# Patient Record
Sex: Female | Born: 1950
Health system: Southern US, Community
[De-identification: ages and names within clinical notes are randomized; demographics above are authoritative.]

## PROBLEM LIST (undated history)

## (undated) DIAGNOSIS — I7300 Raynaud's syndrome without gangrene: Secondary | ICD-10-CM

## (undated) DIAGNOSIS — M1990 Unspecified osteoarthritis, unspecified site: Secondary | ICD-10-CM

## (undated) DIAGNOSIS — E079 Disorder of thyroid, unspecified: Secondary | ICD-10-CM

## (undated) DIAGNOSIS — K219 Gastro-esophageal reflux disease without esophagitis: Secondary | ICD-10-CM

## (undated) DIAGNOSIS — E039 Hypothyroidism, unspecified: Secondary | ICD-10-CM

## (undated) DIAGNOSIS — F32A Depression, unspecified: Secondary | ICD-10-CM

## (undated) DIAGNOSIS — B009 Herpesviral infection, unspecified: Secondary | ICD-10-CM

## (undated) DIAGNOSIS — R011 Cardiac murmur, unspecified: Secondary | ICD-10-CM

## (undated) HISTORY — PX: WISDOM TOOTH EXTRACTION: SHX21

## (undated) HISTORY — PX: TUBAL LIGATION: SHX77

## (undated) HISTORY — PX: THYROIDECTOMY: SHX17

---

## 2017-09-03 ENCOUNTER — Other Ambulatory Visit: Payer: Self-pay | Admitting: Family Medicine

## 2017-09-03 ENCOUNTER — Ambulatory Visit
Admission: RE | Admit: 2017-09-03 | Discharge: 2017-09-03 | Disposition: A | Discharge status: Home / Self Care | Payer: BLUE CROSS/BLUE SHIELD | Source: Ambulatory Visit | Attending: Family Medicine | Admitting: Family Medicine

## 2017-09-03 DIAGNOSIS — M79641 Pain in right hand: Secondary | ICD-10-CM

## 2017-09-03 DIAGNOSIS — M79642 Pain in left hand: Principal | ICD-10-CM

## 2017-11-01 ENCOUNTER — Ambulatory Visit (HOSPITAL_COMMUNITY)
Admission: EM | Admit: 2017-11-01 | Discharge: 2017-11-01 | Disposition: A | Discharge status: Home / Self Care | Payer: BLUE CROSS/BLUE SHIELD | Attending: Family Medicine | Admitting: Family Medicine

## 2017-11-01 ENCOUNTER — Encounter (HOSPITAL_COMMUNITY): Payer: Self-pay | Admitting: Family Medicine

## 2017-11-01 DIAGNOSIS — N390 Urinary tract infection, site not specified: Secondary | ICD-10-CM

## 2017-11-01 DIAGNOSIS — E079 Disorder of thyroid, unspecified: Secondary | ICD-10-CM | POA: Diagnosis not present

## 2017-11-01 DIAGNOSIS — R8281 Pyuria: Secondary | ICD-10-CM

## 2017-11-01 DIAGNOSIS — R509 Fever, unspecified: Secondary | ICD-10-CM | POA: Diagnosis present

## 2017-11-01 HISTORY — DX: Disorder of thyroid, unspecified: E07.9

## 2017-11-01 LAB — CBC WITH DIFFERENTIAL/PLATELET
Basophils Absolute: 0 10*3/uL (ref 0.0–0.1)
Basophils Relative: 0 %
Eosinophils Absolute: 0.3 10*3/uL (ref 0.0–0.7)
Eosinophils Relative: 2 %
HCT: 36.5 % (ref 36.0–46.0)
Hemoglobin: 11.7 g/dL — ABNORMAL LOW (ref 12.0–15.0)
Lymphocytes Relative: 16 %
Lymphs Abs: 1.9 10*3/uL (ref 0.7–4.0)
MCH: 27.5 pg (ref 26.0–34.0)
MCHC: 32.1 g/dL (ref 30.0–36.0)
MCV: 85.7 fL (ref 78.0–100.0)
Monocytes Absolute: 1 10*3/uL (ref 0.1–1.0)
Monocytes Relative: 9 %
Neutro Abs: 8.8 10*3/uL — ABNORMAL HIGH (ref 1.7–7.7)
Neutrophils Relative %: 73 %
Platelets: 306 10*3/uL (ref 150–400)
RBC: 4.26 MIL/uL (ref 3.87–5.11)
RDW: 14.6 % (ref 11.5–15.5)
WBC: 12 10*3/uL — ABNORMAL HIGH (ref 4.0–10.5)

## 2017-11-01 LAB — POCT I-STAT, CHEM 8
BUN: 11 mg/dL (ref 6–20)
CHLORIDE: 103 mmol/L (ref 101–111)
CREATININE: 0.8 mg/dL (ref 0.44–1.00)
Calcium, Ion: 1.1 mmol/L — ABNORMAL LOW (ref 1.15–1.40)
Glucose, Bld: 80 mg/dL (ref 65–99)
HEMATOCRIT: 36 % (ref 36.0–46.0)
Hemoglobin: 12.2 g/dL (ref 12.0–15.0)
POTASSIUM: 4 mmol/L (ref 3.5–5.1)
Sodium: 138 mmol/L (ref 135–145)
TCO2: 26 mmol/L (ref 22–32)

## 2017-11-01 LAB — POCT URINALYSIS DIP (DEVICE)
BILIRUBIN URINE: NEGATIVE
Glucose, UA: NEGATIVE mg/dL
KETONES UR: NEGATIVE mg/dL
Nitrite: NEGATIVE
PH: 7 (ref 5.0–8.0)
Protein, ur: NEGATIVE mg/dL
SPECIFIC GRAVITY, URINE: 1.015 (ref 1.005–1.030)
Urobilinogen, UA: 0.2 mg/dL (ref 0.0–1.0)

## 2017-11-01 MED ORDER — SULFAMETHOXAZOLE-TRIMETHOPRIM 800-160 MG PO TABS: 1.0000 | ORAL_TABLET | Freq: Two times a day (BID) | ORAL | 0 refills | Status: AC

## 2017-11-01 NOTE — ED Triage Notes (Signed)
Pt here for fever and body aches x 4 days

## 2017-11-01 NOTE — Discharge Instructions (Addendum)
Several of your tests are still pending. Nevertheless, you do have some pyuria which could account for the nocturnal fevers.  At this point we are going to run a urine culture and await the final blood tests that we have sent off (CBC)   It may turn out that you had a viral infection, but at this point were starting the antibiotics for the pyuria

## 2017-11-01 NOTE — ED Provider Notes (Signed)
[place]   [id number] 11/01/17 Arrival Time: 1229   SUBJECTIVE:  Tonya Ingram is a 67 y.o. female who presents to the urgent care with complaint of fever and chills for 5 days.  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 On Tuesday of this week she developed an evening fever which she's had every night since. Her temperature is normal in the morning but by afternoon she starts getting chills and the temperature is back.  Patient had a sore throat and cough 2 weeks ago but that resolved and she's had none of that this week. She has no urinary tract infection symptoms, no cough, no nausea vomiting or diarrhea     Past Medical History:  Diagnosis Date  . Thyroid disease    History reviewed. No pertinent family history. Social History   Socioeconomic History  . Marital status: Married    Spouse name: Not on file  . Number of children: Not on file  . Years of education: Not on file  . Highest education level: Not on file  Occupational History  . Not on file  Social Needs  . Financial resource strain: Not on file  . Food insecurity:    Worry: Not on file    Inability: Not on file  . Transportation needs:    Medical: Not on file    Non-medical: Not on file  Tobacco Use  . Smoking status: Never Smoker  . Smokeless tobacco: Never Used  Substance and Sexual Activity  . Alcohol use: Not on file  . Drug use: Not on file  . Sexual activity: Not on file  Lifestyle  . Physical activity:    Days per week: Not on file    Minutes per session: Not on file  . Stress: Not on file  Relationships  . Social connections:    Talks on phone: Not on file    Gets together: Not on file    Attends religious service: Not on file    Active member of club or organization: Not on file    Attends meetings of clubs or organizations: Not on file    Relationship status: Not on file  . Intimate partner violence:    Fear of  current or ex partner: Not on file    Emotionally abused: Not on file    Physically abused: Not on file    Forced sexual activity: Not on file  Other Topics Concern  . Not on file  Social History Narrative  . Not on file   No outpatient medications have been marked as taking for the 11/01/17 encounter [facility] Encounter).   No Known Allergies    ROS: As per HPI, remainder of ROS negative.   OBJECTIVE:   Vitals:   11/01/17 1334  BP: (!) 138/58  Pulse: 70  Resp: 18  Temp: 99.6 F (37.6 C)  TempSrc: Oral  SpO2: 96%     General appearance: alert; no distress Eyes: PERRL; EOMI; conjunctiva normal HENT: normocephalic; atraumatic; TMs normal, canal normal, external ears normal without trauma; nasal mucosa normal; oral mucosa normal Neck: supple; no adenopathy; post surgical thyroid scar midline Lungs: clear to auscultation bilaterally Heart: regular rate and rhythm; soft, 2/6 systolic ejection murmur heard best at the right sternal border Abdomen: soft, non-tender; bowel sounds normal; no masses or organomegaly; no guarding or rebound tenderness Back: no CVA tenderness Extremities: no cyanosis or edema; symmetrical with no gross deformities Skin: warm and dry  Neurologic: normal gait; grossly normal Psychological: alert and cooperative; normal mood and affect      Labs:  Results for orders placed or performed during the hospital encounter of 11/01/17  I-STAT, chem 8  Result Value Ref Range   Sodium 138 135 - 145 mmol/L   Potassium 4.0 3.5 - 5.1 mmol/L   Chloride 103 101 - 111 mmol/L   BUN 11 6 - 20 mg/dL   Creatinine, Ser 0.80 0.44 - 1.00 mg/dL   Glucose, Bld 80 65 - 99 mg/dL   Calcium, Ion 1.10 (L) 1.15 - 1.40 mmol/L   TCO2 26 22 - 32 mmol/L   Hemoglobin 12.2 12.0 - 15.0 g/dL   HCT 36.0 36.0 - 46.0 %  POCT urinalysis dip (device)  Result Value Ref Range   Glucose, UA NEGATIVE NEGATIVE mg/dL   Bilirubin Urine NEGATIVE NEGATIVE   Ketones, ur NEGATIVE  NEGATIVE mg/dL   Specific Gravity, Urine 1.015 1.005 - 1.030   Hgb urine dipstick TRACE (A) NEGATIVE   pH 7.0 5.0 - 8.0   Protein, ur NEGATIVE NEGATIVE mg/dL   Urobilinogen, UA 0.2 0.0 - 1.0 mg/dL   Nitrite NEGATIVE NEGATIVE   Leukocytes, UA SMALL (A) NEGATIVE    Labs Reviewed  POCT I-STAT, CHEM 8 - Abnormal; Notable for the following components:      Result Value   Calcium, Ion 1.10 (*)    All other components within normal limits  POCT URINALYSIS DIP (DEVICE) - Abnormal; Notable for the following components:   Hgb urine dipstick TRACE (*)    Leukocytes, UA SMALL (*)    All other components within normal limits  URINE CULTURE  CBC WITH DIFFERENTIAL/PLATELET    No results found.     ASSESSMENT & PLAN:  1. Pyuria   Several of your tests are still pending. Nevertheless, you do have some pyuria which could account for the nocturnal fevers.  At this point we are going to run a urine culture and await the final blood tests that we have sent off (CBC)   It may turn out that you had a viral infection, but at this point were starting the antibiotics for the pyuria  Meds ordered this encounter  Medications  . sulfamethoxazole-trimethoprim (BACTRIM DS,SEPTRA DS) 800-160 MG tablet    Sig: Take 1 tablet by mouth 2 (two) times daily for 7 days.    Dispense:  14 tablet    Refill:  0    Reviewed expectations re: course of current medical issues. Questions answered. Outlined signs and symptoms indicating need for more acute intervention. Patient verbalized understanding. After Visit Summary given.    Procedures:      Robyn Haber, MD 11/01/17 1420

## 2017-11-03 ENCOUNTER — Telehealth (HOSPITAL_COMMUNITY): Payer: Self-pay

## 2017-11-03 LAB — URINE CULTURE: Culture: >=100000 — AB

## 2017-11-03 MED ORDER — CEPHALEXIN 500 MG PO CAPS: 500.0000 mg | ORAL_CAPSULE | Freq: Two times a day (BID) | ORAL | 0 refills | Status: AC

## 2017-11-03 NOTE — Telephone Encounter (Signed)
Rx changed to Keflex, per [facility] due to patients age.

## 2017-11-03 NOTE — Telephone Encounter (Signed)
Urine culture positive for E.Coli, this is resistant to Bactrim given at urgent care visit. Rx for Macrobid 100 mg BID x 5 days sent to pharmacy on record. Pt called and made aware, verbalized understanding.

## 2018-01-07 ENCOUNTER — Emergency Department (HOSPITAL_COMMUNITY): Payer: BLUE CROSS/BLUE SHIELD

## 2018-01-07 ENCOUNTER — Encounter (HOSPITAL_COMMUNITY): Payer: Self-pay | Admitting: Emergency Medicine

## 2018-01-07 ENCOUNTER — Emergency Department (HOSPITAL_COMMUNITY)
Admission: EM | Admit: 2018-01-07 | Discharge: 2018-01-07 | Disposition: A | Discharge status: Home / Self Care | Payer: BLUE CROSS/BLUE SHIELD | Attending: Emergency Medicine | Admitting: Emergency Medicine

## 2018-01-07 DIAGNOSIS — W010XXA Fall on same level from slipping, tripping and stumbling without subsequent striking against object, initial encounter: Secondary | ICD-10-CM | POA: Insufficient documentation

## 2018-01-07 DIAGNOSIS — Y999 Unspecified external cause status: Secondary | ICD-10-CM | POA: Insufficient documentation

## 2018-01-07 DIAGNOSIS — M25511 Pain in right shoulder: Secondary | ICD-10-CM

## 2018-01-07 DIAGNOSIS — W19XXXA Unspecified fall, initial encounter: Secondary | ICD-10-CM

## 2018-01-07 DIAGNOSIS — Y929 Unspecified place or not applicable: Secondary | ICD-10-CM | POA: Insufficient documentation

## 2018-01-07 DIAGNOSIS — Y9302 Activity, running: Secondary | ICD-10-CM | POA: Diagnosis not present

## 2018-01-07 NOTE — ED Triage Notes (Signed)
Patient here from home with complaints of right should pain after fall this morning while running. Unable to lift arm.

## 2018-01-07 NOTE — Discharge Instructions (Addendum)
As discussed, your evaluation today has been largely reassuring.  But, it is important that you monitor your condition carefully, and do not hesitate to return to the ED if you develop new, or concerning changes in your condition. ? ?Otherwise, please follow-up with your physician for appropriate ongoing care. ? ?

## 2018-01-07 NOTE — ED Provider Notes (Signed)
[facility] DEPT Provider Note   CSN: [id number] Arrival date & time: 01/07/18  3295     History   Chief Complaint Chief Complaint  Patient presents with  . Shoulder Injury    HPI Tonya Ingram is a 67 y.o. female.  HPI  Patient presents with shoulder pain after a fall. Patient was running, when she slipped, falling onto her right shoulder.  Pain is sore, worse with motion Since that time she has had pain in the shoulder, worse with continued running or trying to use a stationary bicycle. No other substantial trauma, no head contact, no syncope, no weakness or numbness in the hand, wrist. Patient took Aleve prior to the fall. She notes that she has had some ongoing soreness in the shoulder, nonradiating.  Past Medical History:  Diagnosis Date  . Thyroid disease     There are no active problems to display for this patient.   History reviewed. No pertinent surgical history.   OB History   None      Home Medications    Prior to Admission medications   Not on File    Family History No family history on file.  Social History Social History   Tobacco Use  . Smoking status: Never Smoker  . Smokeless tobacco: Never Used  Substance Use Topics  . Alcohol use: Not on file  . Drug use: Not on file     Allergies   Patient has no known allergies.   Review of Systems Review of Systems  Constitutional:       Per HPI, otherwise negative  HENT:       Per HPI, otherwise negative  Respiratory:       Per HPI, otherwise negative  Cardiovascular:       Per HPI, otherwise negative  Gastrointestinal: Negative for vomiting.  Endocrine:       Negative aside from HPI  Genitourinary:       Neg aside from HPI   Musculoskeletal:       Per HPI, otherwise negative  Skin: Negative.   Neurological: Negative for syncope.     Physical Exam Updated Vital Signs BP 136/83 (BP Location: Left Arm)   Pulse 86   Temp 98 F (36.7 C)  (Oral)   Resp 19   SpO2 99%   Physical Exam  Constitutional: She is oriented to person, place, and time. She appears well-developed and well-nourished. No distress.  HENT:  Head: Normocephalic and atraumatic.  Eyes: Conjunctivae and EOM are normal.  Cardiovascular: Normal rate and regular rhythm.  Pulmonary/Chest: Effort normal and breath sounds normal. No stridor. No respiratory distress.  Abdominal: She exhibits no distension.  Musculoskeletal: She exhibits no edema.       Right elbow: Normal.      Right wrist: Normal.       Arms: Neurological: She is alert and oriented to person, place, and time. No cranial nerve deficit.  Skin: Skin is warm and dry.  Psychiatric: She has a normal mood and affect.  Nursing note and vitals reviewed.    ED Treatments / Results  Labs (all labs ordered are listed, but only abnormal results are displayed) Labs Reviewed - No data to display  EKG None  Radiology No results found.  Procedures Procedures (including critical care time)  Medications Ordered in ED Medications - No data to display   Initial Impression / Assessment and Plan / ED Course  I have reviewed the triage vital signs and the  nursing notes.  Pertinent labs & imaging results that were available during my care of the patient were reviewed by me and considered in my medical decision making (see chart for details).  Patient presents with shoulder pain following a fall. Patient is awake, alert, with unrestricted range of motion, point tenderness about to the superior aspect of the shoulder, some suspicion for sprain given the reassuring x-rays, lack of distal neurovascular compromise, or deformity. Patient received ice here, sling, was discharged with ongoing anti-inflammatories, orthopedic follow-up as needed.  Final Clinical Impressions(s) / ED Diagnoses  Fall, initial encounter Shoulder pain, right, initial encounter   Carmin Muskrat, MD 01/07/18 [phone]

## 2019-04-26 ENCOUNTER — Other Ambulatory Visit: Payer: Self-pay

## 2019-04-26 DIAGNOSIS — Z20822 Contact with and (suspected) exposure to covid-19: Secondary | ICD-10-CM

## 2019-04-27 LAB — NOVEL CORONAVIRUS, NAA: SARS-CoV-2, NAA: DETECTED — AB

## 2019-08-23 ENCOUNTER — Ambulatory Visit: Payer: Self-pay | Attending: Internal Medicine

## 2019-08-23 DIAGNOSIS — Z23 Encounter for immunization: Secondary | ICD-10-CM | POA: Insufficient documentation

## 2019-08-23 NOTE — Progress Notes (Signed)
   Covid-19 Vaccination Clinic  Name:  Bonnielee Ryon    MRN: [id number] DOB: 11-Dec-1950  08/23/2019  Ms. Iyer was observed post Covid-19 immunization for 15 minutes without incidence. She was provided with Vaccine Information Sheet and instruction to access the V-Safe system.   Ms. Carlow was instructed to call 911 with any severe reactions post vaccine: Marland Kitchen Difficulty breathing  . Swelling of your face and throat  . A fast heartbeat  . A bad rash all over your body  . Dizziness and weakness    Immunizations Administered    Name Date Dose VIS Date Route   Pfizer COVID-19 Vaccine 08/23/2019 10:58 AM 0.3 mL 06/04/2019 Intramuscular   Manufacturer: [place]   Lot: [id number]   [place]: [id number]

## 2019-09-21 ENCOUNTER — Ambulatory Visit: Payer: Self-pay | Attending: Internal Medicine

## 2019-09-21 DIAGNOSIS — Z23 Encounter for immunization: Secondary | ICD-10-CM

## 2019-09-21 NOTE — Progress Notes (Signed)
   Covid-19 Vaccination Clinic  Name:  Tonya Ingram    MRN: [id number] DOB: 05/31/1951  09/21/2019  Ms. Tonya Ingram was observed post Covid-19 immunization for 15 minutes without incident. She was provided with Vaccine Information Sheet and instruction to access the V-Safe system.   Ms. Tonya Ingram was instructed to call 911 with any severe reactions post vaccine: Marland Kitchen Difficulty breathing  . Swelling of face and throat  . A fast heartbeat  . A bad rash all over body  . Dizziness and weakness   Immunizations Administered    Name Date Dose VIS Date Route   Pfizer COVID-19 Vaccine 09/21/2019 11:32 AM 0.3 mL 06/04/2019 Intramuscular   Manufacturer: [place]   Lot: [id number]   [place]: [id number]

## 2019-10-18 ENCOUNTER — Other Ambulatory Visit: Payer: Self-pay

## 2019-10-18 ENCOUNTER — Ambulatory Visit
Admission: EM | Admit: 2019-10-18 | Discharge: 2019-10-18 | Disposition: A | Discharge status: Home / Self Care | Payer: PPO

## 2019-10-18 ENCOUNTER — Encounter: Payer: Self-pay | Admitting: Emergency Medicine

## 2019-10-18 DIAGNOSIS — G8929 Other chronic pain: Secondary | ICD-10-CM

## 2019-10-18 DIAGNOSIS — M25562 Pain in left knee: Secondary | ICD-10-CM

## 2019-10-18 HISTORY — DX: Unspecified osteoarthritis, unspecified site: M19.90

## 2019-10-18 MED ORDER — PREDNISONE 50 MG PO TABS: 50.0000 mg | ORAL_TABLET | Freq: Every day | ORAL | 0 refills | Status: DC

## 2019-10-18 NOTE — ED Triage Notes (Signed)
Left knee for 2 months and last 2 weeks pain has increased significantly.  No known injury  Patient is a runner, bicycle rider, and has had this routine for years, nothing new.   Stem cell therapy in left hip, injection one month ago.  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

## 2019-10-18 NOTE — Discharge Instructions (Signed)
Start prednisone as directed. Ice compress, knee brace during activity. Follow up with orthopedics/sports medicine if symptoms not improving.

## 2019-10-18 NOTE — ED Provider Notes (Signed)
[facility]    CSN: [id number] Arrival date & time: 10/18/19  0808      History   Chief Complaint Chief Complaint  Patient presents with  . Knee Pain    HPI Tonya Ingram is a 69 y.o. female.   69 year old female comes in for worsening chronic left knee pain. Denies new injury/trauma.  States pain started 2 months ago, that has worsened the past 2 weeks.  She lives an active lifestyle with running, cycling.  Since symptoms starting, has had decrease in activity for the past 1.5 months.  Pain is to the anterior lateral knee, that is worse with weightbearing/ambulation.  Now pain also radiating to the lateral leg, anterior thigh.  Denies swelling, erythema, warmth.  Has had numbness, tingling to the leg that is worse during the morning.  Used KT tape, topical lidocaine, naproxen 440mg  BID, Tylenol 1500mg  Q5-6H for the past 3 weeks without relief.     Past Medical History:  Diagnosis Date  . Arthritis   . Thyroid disease     There are no problems to display for this patient.   Past Surgical History:  Procedure Laterality Date  . THYROIDECTOMY    . TUBAL LIGATION      OB History   No obstetric history on file.      Home Medications    Prior to Admission medications   Medication Sig Start Date End Date Taking? Authorizing Provider  escitalopram (LEXAPRO) 10 MG tablet Take 10 mg by mouth daily.   Yes [provider]  levothyroxine (SYNTHROID) 50 MCG tablet Take 50 mcg by mouth daily before breakfast.   Yes [provider]  predniSONE (DELTASONE) 50 MG tablet Take 1 tablet (50 mg total) by mouth daily with breakfast. 10/18/19   Ok Edwards, PA-C    Family History Family History  Problem Relation Age of Onset  . Parkinson's disease Mother   . Arthritis Mother   . Prostate cancer Father   . Hypertension Father     Social History Social History   Tobacco Use  . Smoking status: Never Smoker  . Smokeless tobacco: Never Used    Substance Use Topics  . Alcohol use: Never  . Drug use: Not on file     Allergies   Patient has no known allergies.   Review of Systems Review of Systems  Reason unable to perform ROS: See HPI as above.     Physical Exam Triage Vital Signs ED Triage Vitals  Enc Vitals Group     BP 10/18/19 0821 (!) 153/81     Pulse Rate 10/18/19 0821 66     Resp 10/18/19 0821 16     Temp 10/18/19 0821 99.2 F (37.3 C)     Temp Source 10/18/19 0821 Oral     SpO2 10/18/19 0821 96 %     Weight --      Height --      Head Circumference --      Peak Flow --      Pain Score 10/18/19 0837 8     Pain Loc --      Pain Edu? --      Excl. in [place]? --    No data found.  Updated Vital Signs BP (!) 153/81 (BP Location: Left Arm)   Pulse 66   Temp 99.2 F (37.3 C) (Oral)   Resp 16   SpO2 96%   Physical Exam Constitutional:      General:  She is not in acute distress.    Appearance: Normal appearance. She is well-developed. She is not toxic-appearing or diaphoretic.  HENT:     Head: Normocephalic and atraumatic.  Eyes:     Conjunctiva/sclera: Conjunctivae normal.     Pupils: Pupils are equal, round, and reactive to light.  Pulmonary:     Effort: Pulmonary effort is normal. No respiratory distress.     Comments: Speaking in full sentences without difficulty Musculoskeletal:     Cervical back: Normal range of motion and neck supple.     Comments: No swelling, erythema, warmth, contusion.  No tenderness to palpation of the knee, thigh, leg.  Full range of motion of hip, knee.  No crepitus felt during range of motion.  Strength 5/5, sensation intact.   Skin:    General: Skin is warm and dry.  Neurological:     Mental Status: She is alert and oriented to person, place, and time.    UC Treatments / Results  Labs (all labs ordered are listed, but only abnormal results are displayed) Labs Reviewed - No data to display  EKG   Radiology No results found.  Procedures Procedures  (including critical care time)  Medications Ordered in UC Medications - No data to display  Initial Impression / Assessment and Plan / UC Course  I have reviewed the triage vital signs and the nursing notes.  Pertinent labs & imaging results that were available during my care of the patient were reviewed by me and considered in my medical decision making (see chart for details).    Offered x-ray, though discussed most likely will not change management with current history and exam.  Patient agreeable to defer to orthopedics if needed.  Will start patient on prednisone, knee sleeve.  Continue ice compress, rest.  Patient to follow-up with orthopedics for further evaluation if symptoms not improving.  Return precautions given.  Patient expresses understanding and agrees to plan.  Final Clinical Impressions(s) / UC Diagnoses   Final diagnoses:  Chronic pain of left knee   ED Prescriptions    Medication Sig Dispense Auth. Provider   predniSONE (DELTASONE) 50 MG tablet Take 1 tablet (50 mg total) by mouth daily with breakfast. 5 tablet Ok Edwards, PA-C     PDMP not reviewed this encounter.   Ok Edwards, PA-C 10/18/19 1447

## 2019-11-01 DIAGNOSIS — Z1231 Encounter for screening mammogram for malignant neoplasm of breast: Secondary | ICD-10-CM | POA: Diagnosis not present

## 2019-11-01 DIAGNOSIS — Z01419 Encounter for gynecological examination (general) (routine) without abnormal findings: Secondary | ICD-10-CM | POA: Diagnosis not present

## 2019-11-02 DIAGNOSIS — Z01419 Encounter for gynecological examination (general) (routine) without abnormal findings: Secondary | ICD-10-CM | POA: Diagnosis not present

## 2019-11-03 ENCOUNTER — Other Ambulatory Visit: Payer: Self-pay | Admitting: Obstetrics

## 2019-11-03 DIAGNOSIS — M8588 Other specified disorders of bone density and structure, other site: Secondary | ICD-10-CM

## 2019-11-03 DIAGNOSIS — Z79899 Other long term (current) drug therapy: Secondary | ICD-10-CM

## 2019-11-16 DIAGNOSIS — Z1211 Encounter for screening for malignant neoplasm of colon: Secondary | ICD-10-CM | POA: Diagnosis not present

## 2019-11-16 DIAGNOSIS — Z0000 Encounter for general adult medical examination without abnormal findings: Secondary | ICD-10-CM | POA: Diagnosis not present

## 2019-11-16 DIAGNOSIS — Z789 Other specified health status: Secondary | ICD-10-CM | POA: Diagnosis not present

## 2019-11-16 DIAGNOSIS — E039 Hypothyroidism, unspecified: Secondary | ICD-10-CM | POA: Diagnosis not present

## 2019-11-16 DIAGNOSIS — Z7189 Other specified counseling: Secondary | ICD-10-CM | POA: Diagnosis not present

## 2019-11-16 DIAGNOSIS — F325 Major depressive disorder, single episode, in full remission: Secondary | ICD-10-CM | POA: Diagnosis not present

## 2019-11-16 DIAGNOSIS — Z23 Encounter for immunization: Secondary | ICD-10-CM | POA: Diagnosis not present

## 2019-11-16 DIAGNOSIS — Z136 Encounter for screening for cardiovascular disorders: Secondary | ICD-10-CM | POA: Diagnosis not present

## 2019-11-16 DIAGNOSIS — Z1159 Encounter for screening for other viral diseases: Secondary | ICD-10-CM | POA: Diagnosis not present

## 2019-11-16 DIAGNOSIS — L989 Disorder of the skin and subcutaneous tissue, unspecified: Secondary | ICD-10-CM | POA: Diagnosis not present

## 2019-11-18 DIAGNOSIS — Z79899 Other long term (current) drug therapy: Secondary | ICD-10-CM | POA: Diagnosis not present

## 2019-11-29 DIAGNOSIS — Z1211 Encounter for screening for malignant neoplasm of colon: Secondary | ICD-10-CM | POA: Diagnosis not present

## 2019-12-01 ENCOUNTER — Other Ambulatory Visit: Payer: Self-pay

## 2019-12-01 ENCOUNTER — Encounter: Payer: Self-pay | Admitting: Sports Medicine

## 2019-12-01 ENCOUNTER — Ambulatory Visit
Admission: RE | Admit: 2019-12-01 | Discharge: 2019-12-01 | Disposition: A | Discharge status: Home / Self Care | Payer: PPO | Source: Ambulatory Visit | Attending: Sports Medicine | Admitting: Sports Medicine

## 2019-12-01 ENCOUNTER — Ambulatory Visit: Payer: PPO | Admitting: Sports Medicine

## 2019-12-01 VITALS — BP 122/82 | Ht 65.0 in | Wt 120.0 lb

## 2019-12-01 DIAGNOSIS — M25562 Pain in left knee: Secondary | ICD-10-CM

## 2019-12-01 DIAGNOSIS — M1612 Unilateral primary osteoarthritis, left hip: Secondary | ICD-10-CM | POA: Diagnosis not present

## 2019-12-01 DIAGNOSIS — M47816 Spondylosis without myelopathy or radiculopathy, lumbar region: Secondary | ICD-10-CM | POA: Diagnosis not present

## 2019-12-01 DIAGNOSIS — M25552 Pain in left hip: Secondary | ICD-10-CM | POA: Diagnosis not present

## 2019-12-01 NOTE — Patient Instructions (Signed)
To help determine the cause of pain in your left leg I have ordered x-rays of both your hip and your back.  I will call you with the results of these x-rays and we can discuss the next steps in treating your symptoms.  This may involve doing a diagnostic injection of your hip or having you do some physical therapy exercises for your low back depending on what the results of the x-ray showed.  The ultrasound done of your knee showed some mild arthritis and a possible small tear of your meniscus.  To help treat this we will fit you for a knee compression sleeve.  You should wear this with activity but you do not need to wear it while resting at home.  I have also given you an exercise program for your knee.  This will help strengthen your quads and hamstrings.  I will call you within the next several days with the results of the x-rays.  We will discuss further follow-up at that time.  If you have any other questions in the meantime please do not hesitate to contact me.

## 2019-12-01 NOTE — Progress Notes (Addendum)
PCP: [facility]  Subjective:   HPI: Patient is a 69 y.o. female here for evaluation of left hip pain and left knee pain.  Patient notes she has been diagnosed with left hip pain in the past.  She has received multiple rounds of stem cell injections into the hip but appears that these injections were done into the bursa and not the hip joint itself.  Patient notes she did not receive any benefit from this.  Patient states the pain is located in her groin.  Following her last injection she did have pain that started radiating down the lateral aspect of her leg past her knee.  She also has some pain in her left glutes area.  Patient also endorses lateral left knee pain.  There was no injury or trauma.  Squats and kneeling aggravate the pain.  She denies any swelling or bruising.  She has not done anything for the pain other than take over-the-counter anti-inflammatories.  She denies any mechanical symptoms such as locking or catching.  She states the knee does not feel like it is going to give out.   Review of Systems: See HPI above.  Past Medical History:  Diagnosis Date   Arthritis    Thyroid disease     Current Outpatient Medications on File Prior to Visit  Medication Sig Dispense Refill   escitalopram (LEXAPRO) 10 MG tablet Take 10 mg by mouth daily.     levothyroxine (SYNTHROID) 50 MCG tablet Take 50 mcg by mouth daily before breakfast.     predniSONE (DELTASONE) 50 MG tablet Take 1 tablet (50 mg total) by mouth daily with breakfast. (Patient not taking: Reported on 12/01/2019) 5 tablet 0   No current facility-administered medications on file prior to visit.    Past Surgical History:  Procedure Laterality Date   THYROIDECTOMY     TUBAL LIGATION      Allergies  Allergen Reactions   Codeine Nausea And Vomiting    Social History   Socioeconomic History   Marital status: Divorced    Spouse name: Not on file   Number of children: Not on  file   Years of education: Not on file   Highest education level: Not on file  Occupational History   Not on file  Tobacco Use   Smoking status: Never Smoker   Smokeless tobacco: Never Used  Substance and Sexual Activity   Alcohol use: Never   Drug use: Not on file   Sexual activity: Not on file  Other Topics Concern   Not on file  Social History Narrative   Not on file   Social Determinants of Health   Financial Resource Strain:    Difficulty of Paying Living Expenses:   Food Insecurity:    Worried About [place] in the Last Year:    Arboriculturist in the Last Year:   Transportation Needs:    Film/video editor (Medical):    Lack of Transportation (Non-Medical):   Physical Activity:    Days of Exercise per Week:    Minutes of Exercise per Session:   Stress:    Feeling of Stress :   Social Connections:    Frequency of Communication with Friends and Family:    Frequency of Social Gatherings with Friends and Family:    Attends Religious Services:    Active Member of Clubs or Organizations:    Attends Archivist Meetings:    Marital Status:  Intimate Partner Violence:    Fear of Current or Ex-Partner:    Emotionally Abused:    Physically Abused:    Sexually Abused:     Family History  Problem Relation Age of Onset   Parkinson's disease Mother    Arthritis Mother    Prostate cancer Father    Hypertension Father         Objective:  Physical Exam: BP 122/82    Ht 5\' 5"  (1.651 m)    Wt 120 lb (54.4 kg)    BMI 19.97 kg/m  Gen: NAD, comfortable in exam room Lungs: Breathing comfortably on room air Lumbar Exam -Inspection: No deformity, no discoloration -Palpation: No tenderness palpation -Straight leg: Negative -SLUMP: Negative  Hip Exam Left -Inspection: No deformity, no discoloration -Palpation: No tenderness palpation at the greater trochanteric bursa -ROM: Normal ROM with flexion, extension,  abduction, internal rotation, external rotation -Strength: Flexion: 5/5; Extension 5/5 -Special Tests:  Log roll: Positive -Limb neurovascularly intact  Knee Exam Left -Inspection: no deformity, no discoloration -Palpation: medial joint line: Non-tender; lateral joint line: non-tender; quad tendon: non-tender; patella: non-tender; patellar tendon: non-tendon -ROM: Extension: 0 degrees; Flexion: 130 degrees -Strength: Extension: 5/5; Flexion: 5/5 -Special Tests: Varus Stress: Negative; Valgus Stress: Negative; Lachman: Negative; Posterior drawer: Negative; McMurray: Negative; Thessaly: Negative; Patellar grind: Negative -Limb neurovascularly intact, no instability noted   Limited diagnostic ultrasound of the left knee Findings: -Normal appearance of the quadricep and patellar tendons -No fluid noted within the suprapatellar pouch -Normal appearance of the trochlear groove -Normal appearance of the medial meniscus -Fluid noted surrounding the lateral meniscus and what appeared to be a small tear Impression: -Ultrasound findings showing possible lateral meniscus tear.  Otherwise ultrasound examination was normal   Assessment & Plan:  Patient is a 69 y.o. female here for evaluation of left leg pain left knee pain  1.  Left hip/leg pain -Patient was previously diagnosed with osteoarthritis of the hip.  In the past she received injections in the hip however it appears that these are bursa injections and not true intra-articular hip injections. -I have ordered x-rays of both the patient's hip and her back to determine whether her symptoms are coming from true intra-articular hip pathology versus radicular symptoms coming from the lumbar spine.  I will call patient with the results of these x-rays -Depending on the results of the x-rays we may consider treatment of lumbar radicular symptoms with physical therapy versus doing a diagnostic hip injection to determine whether the source of her  pain truly is coming from her hip joint  2.  Left knee pain -Ultrasound showing degenerative tear of the lateral meniscus.  Minimal arthritis was seen on the ultrasound -Patient given a knee compression sleeve to wear with activity -Patient given a home exercise program  I will call patient with results of the x-rays and will discuss further follow-up at that time  I was the preceptor for this visit and available for immediate consultation Shellia Cleverly, DO

## 2019-12-02 ENCOUNTER — Telehealth: Payer: Self-pay | Admitting: Sports Medicine

## 2019-12-02 MED ORDER — METHYLPREDNISOLONE 4 MG PO TBPK: ORAL_TABLET | ORAL | 0 refills | Status: DC

## 2019-12-02 NOTE — Addendum Note (Signed)
Addended by: Jolinda Croak E on: 12/02/2019 02:04 PM   Modules accepted: Orders

## 2019-12-02 NOTE — Telephone Encounter (Signed)
I called patient discussed the results of her hip and lumbar spine x-rays.  Patient's hip x-ray showing degenerative disease of the hip.  Lumbar spine x-rays also showing degenerative disc disease of the low back with anterolisthesis at L4-L5.  Patient states most of her symptoms are radiating down her leg.  She is having some pain in her groin although this is much less severe than the burning pain shooting down her leg.  Because of this we decided to proceed with treating her lumbar radiculopathy.  Patient was emailed a set of home exercises for her lumbar spine.  She was also given a prednisone Dosepak.  Patient will follow up in 4 to 6 weeks for repeat evaluation.  If her symptoms do not improve we may consider MRI of the lumbar spine versus possible diagnostic injection of the hip. -Inez Catalina, MD

## 2019-12-08 ENCOUNTER — Ambulatory Visit: Payer: PPO | Admitting: Sports Medicine

## 2019-12-08 ENCOUNTER — Other Ambulatory Visit: Payer: Self-pay

## 2019-12-08 ENCOUNTER — Encounter: Payer: Self-pay | Admitting: Sports Medicine

## 2019-12-08 VITALS — BP 122/72 | Ht 65.0 in | Wt 120.0 lb

## 2019-12-08 DIAGNOSIS — M545 Low back pain, unspecified: Secondary | ICD-10-CM

## 2019-12-08 DIAGNOSIS — M5416 Radiculopathy, lumbar region: Secondary | ICD-10-CM | POA: Diagnosis not present

## 2019-12-08 NOTE — Progress Notes (Addendum)
PCP: [facility]  Subjective:   HPI: Patient is a 69 y.o. female here for follow-up of left leg pain.  Since her last visit patient had x-rays done of her low back and her left hip.  Left hip x-rays show moderate arthritis of the left hip.  She also had x-rays of the lumbar spine showing anterior listhesis at L4-L5.  She also had diffuse degenerative changes throughout the lumbar spine.  Most the patient's symptoms currently are rating down the lateral aspect of her left leg in the distribution of the L4 nerve root.  She denies any pain radiating down the anterior aspect of her leg.  Patient endorses minimal pain in the left groin.  She has no pain in the left buttocks.  Occasionally will get pain in the low back.  Patient has been doing home rehab exercises for her low back for the last 4 to 6 months.  This has not improved her symptoms at all.  She is also done multiple rounds of oral prednisone which have temporarily improved her pain but the leg pain is always returned.  Anti-inflammatories have not improved her pain.  Patient denies any mechanical symptoms of the left hip.  She does get some numbness and tingling in the same distribution of her leg pain.   Review of Systems: See HPI above.  Past Medical History:  Diagnosis Date  . Arthritis   . Thyroid disease     Current Outpatient Medications on File Prior to Visit  Medication Sig Dispense Refill  . escitalopram (LEXAPRO) 10 MG tablet Take 10 mg by mouth daily.    Marland Kitchen levothyroxine (SYNTHROID) 50 MCG tablet Take 50 mcg by mouth daily before breakfast.    . methylPREDNISolone (MEDROL DOSEPAK) 4 MG TBPK tablet Take as directed. 21 tablet 0  . predniSONE (DELTASONE) 50 MG tablet Take 1 tablet (50 mg total) by mouth daily with breakfast. (Patient not taking: Reported on 12/01/2019) 5 tablet 0   No current facility-administered medications on file prior to visit.    Past Surgical History:  Procedure Laterality  Date  . THYROIDECTOMY    . TUBAL LIGATION      Allergies  Allergen Reactions  . Codeine Nausea And Vomiting    Social History   Socioeconomic History  . Marital status: Divorced    Spouse name: Not on file  . Number of children: Not on file  . Years of education: Not on file  . Highest education level: Not on file  Occupational History  . Not on file  Tobacco Use  . Smoking status: Never Smoker  . Smokeless tobacco: Never Used  Substance and Sexual Activity  . Alcohol use: Never  . Drug use: Not on file  . Sexual activity: Not on file  Other Topics Concern  . Not on file  Social History Narrative  . Not on file   Social Determinants of Health   Financial Resource Strain:   . Difficulty of Paying Living Expenses:   Food Insecurity:   . Worried About Charity fundraiser in the Last Year:   . Arboriculturist in the Last Year:   Transportation Needs:   . Film/video editor (Medical):   Marland Kitchen Lack of Transportation (Non-Medical):   Physical Activity:   . Days of Exercise per Week:   . Minutes of Exercise per Session:   Stress:   . Feeling of Stress :   Social Connections:   . Frequency  of Communication with Friends and Family:   . Frequency of Social Gatherings with Friends and Family:   . Attends Religious Services:   . Active Member of Clubs or Organizations:   . Attends Archivist Meetings:   Marland Kitchen Marital Status:   Intimate Partner Violence:   . Fear of Current or Ex-Partner:   . Emotionally Abused:   Marland Kitchen Physically Abused:   . Sexually Abused:     Family History  Problem Relation Age of Onset  . Parkinson's disease Mother   . Arthritis Mother   . Prostate cancer Father   . Hypertension Father         Objective:  Physical Exam: BP 122/72   Ht 5\' 5"  (1.651 m)   Wt 120 lb (54.4 kg)   BMI 19.97 kg/m  Gen: NAD, comfortable in exam room Lungs: Breathing comfortably on room air Lumbar Exam -Inspection: No deformity, no  discoloration -Palpation: SI joint: non-tender bilaterally; Paraspinal muscles: non-tender bilaterally; Spinous processes: non-tender -ROM: Normal ROM with forward flexion, extension, lateral bending bilaterally, and rotation bilaterally -Strength: 5/5 hip flexion bilaterally, 5/5 knee extension bilaterally, 5/5 knee flexion bilaterally, 5/5 foot dorsiflexion bilaterally, 5/5 foot plantarflexion bilaterally -Straight leg: Positive -SLUMP: Positive -FABER: Negative  Hip Exam Left -Inspection: No deformity, no discoloration -Palpation: SI joint: non-tender; greater trochanter: non-tender -ROM: Patient with 10 degrees of internal rotation of the hip.  This is painless.  Patient with about 30 degrees of external rotation of the hip -Strength: Flexion: 5/5; Extension 5/5; Abduction: 5/5 -Limb neurovascularly intact   Assessment & Plan:  Patient is a 69 y.o. female here for evaluation of left leg pain  1.  Left lumbar radiculopathy -Patient with symptoms of moderate OA of her left hip on x-ray however she also has anterior listhesis at L4-L5 on her lumbar spine x-rays.  Patient symptoms are more consistent with a lumbar radiculopathy at this point then from osteoarthritis of her left hip.  Given this will pursue further work-up and treatment for her lumbar spine before pursuing treatment of her hip. -Patient is already failed multiple courses of oral prednisone, oral anti-inflammatories, and extensive home rehab for her lumbar spine -We will proceed with getting a MRI of the lumbar spine to better evaluate for nerve impingement.  We will likely send patient for lumbar epidural injections following this -In the future may consider corticosteroid injection of the left hip, however at this time it appears most of her symptoms are coming from her back  Patient will follow up after her MRI  I was the preceptor for this visit and available for immediate consultation Shellia Cleverly, DO

## 2019-12-22 ENCOUNTER — Ambulatory Visit
Admission: RE | Admit: 2019-12-22 | Discharge: 2019-12-22 | Disposition: A | Discharge status: Home / Self Care | Payer: PPO | Source: Ambulatory Visit | Attending: Sports Medicine | Admitting: Sports Medicine

## 2019-12-22 DIAGNOSIS — M545 Low back pain, unspecified: Secondary | ICD-10-CM

## 2019-12-22 DIAGNOSIS — M48061 Spinal stenosis, lumbar region without neurogenic claudication: Secondary | ICD-10-CM | POA: Diagnosis not present

## 2019-12-30 ENCOUNTER — Other Ambulatory Visit: Payer: Self-pay

## 2019-12-30 DIAGNOSIS — M5416 Radiculopathy, lumbar region: Secondary | ICD-10-CM

## 2019-12-31 ENCOUNTER — Other Ambulatory Visit: Payer: Self-pay | Admitting: Family Medicine

## 2019-12-31 DIAGNOSIS — M5416 Radiculopathy, lumbar region: Secondary | ICD-10-CM

## 2020-01-03 ENCOUNTER — Ambulatory Visit: Payer: PPO | Admitting: Family Medicine

## 2020-01-10 ENCOUNTER — Other Ambulatory Visit: Payer: PPO

## 2020-01-10 ENCOUNTER — Ambulatory Visit
Admission: RE | Admit: 2020-01-10 | Discharge: 2020-01-10 | Disposition: A | Discharge status: Home / Self Care | Payer: PPO | Source: Ambulatory Visit | Attending: Family Medicine | Admitting: Family Medicine

## 2020-01-10 DIAGNOSIS — M48061 Spinal stenosis, lumbar region without neurogenic claudication: Secondary | ICD-10-CM | POA: Diagnosis not present

## 2020-01-10 DIAGNOSIS — M5416 Radiculopathy, lumbar region: Secondary | ICD-10-CM

## 2020-01-10 MED ORDER — METHYLPREDNISOLONE ACETATE 40 MG/ML INJ SUSP (RADIOLOG
120.0000 mg | Freq: Once | INTRAMUSCULAR | Status: AC
  Administered 2020-01-10: 120 mg via EPIDURAL

## 2020-01-10 MED ORDER — IOPAMIDOL (ISOVUE-M 200) INJECTION 41%
1.0000 mL | Freq: Once | INTRAMUSCULAR | Status: AC
  Administered 2020-01-10: 1 mL via EPIDURAL

## 2020-01-10 NOTE — Discharge Instructions (Signed)

## 2020-01-12 ENCOUNTER — Ambulatory Visit: Payer: PPO | Admitting: Family Medicine

## 2020-01-17 ENCOUNTER — Other Ambulatory Visit: Payer: PPO

## 2020-01-17 DIAGNOSIS — L578 Other skin changes due to chronic exposure to nonionizing radiation: Secondary | ICD-10-CM | POA: Diagnosis not present

## 2020-01-17 DIAGNOSIS — B351 Tinea unguium: Secondary | ICD-10-CM | POA: Diagnosis not present

## 2020-01-17 DIAGNOSIS — L814 Other melanin hyperpigmentation: Secondary | ICD-10-CM | POA: Diagnosis not present

## 2020-01-17 DIAGNOSIS — D1801 Hemangioma of skin and subcutaneous tissue: Secondary | ICD-10-CM | POA: Diagnosis not present

## 2020-01-17 DIAGNOSIS — D2371 Other benign neoplasm of skin of right lower limb, including hip: Secondary | ICD-10-CM | POA: Diagnosis not present

## 2020-01-17 DIAGNOSIS — L821 Other seborrheic keratosis: Secondary | ICD-10-CM | POA: Diagnosis not present

## 2020-01-17 DIAGNOSIS — D485 Neoplasm of uncertain behavior of skin: Secondary | ICD-10-CM | POA: Diagnosis not present

## 2020-01-17 DIAGNOSIS — L859 Epidermal thickening, unspecified: Secondary | ICD-10-CM | POA: Diagnosis not present

## 2020-01-19 ENCOUNTER — Other Ambulatory Visit: Payer: Self-pay

## 2020-01-19 ENCOUNTER — Ambulatory Visit: Payer: PPO | Admitting: Family Medicine

## 2020-01-19 ENCOUNTER — Encounter: Payer: Self-pay | Admitting: Family Medicine

## 2020-01-19 ENCOUNTER — Ambulatory Visit: Payer: Self-pay

## 2020-01-19 VITALS — BP 100/64 | Ht 65.0 in | Wt 117.0 lb

## 2020-01-19 DIAGNOSIS — M25552 Pain in left hip: Secondary | ICD-10-CM | POA: Diagnosis not present

## 2020-01-19 DIAGNOSIS — M1612 Unilateral primary osteoarthritis, left hip: Secondary | ICD-10-CM

## 2020-01-19 MED ORDER — METHYLPREDNISOLONE ACETATE 40 MG/ML IJ SUSP
40.0000 mg | Freq: Once | INTRAMUSCULAR | Status: AC
  Administered 2020-01-19: 40 mg via INTRA_ARTICULAR

## 2020-01-19 NOTE — Progress Notes (Signed)
PCP: [facility]  Subjective:   HPI: Patient is a 69 y.o. female here for follow-up on left leg/hip pain.  She was seen here on 6/16, at that time she had been worked up for lumbar radiculopathy and found to have anterior listhesis at L4-L5 on x-ray.  She was referred for epidural steroid injection and reports today that she has significant improvement in her symptoms.  She states that she is about 80% improved.  She continues have some numbness and tingling in her left foot but is not very bothersome.  She does however continue to have left groin/hip pain.  This was present previously and she had recent x-rays which showed degenerative osteoarthritis of the left hip.  She reports that her groin pain is worse at the end of/after going on walks.  Is relieved by avoiding weightbearing activity.  She denies any numbness or weakness.  Past Medical History:  Diagnosis Date   Arthritis    Thyroid disease     Current Outpatient Medications on File Prior to Visit  Medication Sig Dispense Refill   escitalopram (LEXAPRO) 10 MG tablet Take 10 mg by mouth daily.     furosemide (LASIX) 20 MG tablet furosemide 20 mg tablet  TAKE 1/2 TABLET BY MOUTH EVERY DAY AS NEEDED     levothyroxine (SYNTHROID) 50 MCG tablet Take 50 mcg by mouth daily before breakfast.     No current facility-administered medications on file prior to visit.    Past Surgical History:  Procedure Laterality Date   THYROIDECTOMY     TUBAL LIGATION      Allergies  Allergen Reactions   Codeine Nausea And Vomiting    Social History   Socioeconomic History   Marital status: Divorced    Spouse name: Not on file   Number of children: Not on file   Years of education: Not on file   Highest education level: Not on file  Occupational History   Not on file  Tobacco Use   Smoking status: Never Smoker   Smokeless tobacco: Never Used  Substance and Sexual Activity   Alcohol use:  Never   Drug use: Not on file   Sexual activity: Not on file  Other Topics Concern   Not on file  Social History Narrative   Not on file   Social Determinants of Health   Financial Resource Strain:    Difficulty of Paying Living Expenses:   Food Insecurity:    Worried About [place] in the Last Year:    Arboriculturist in the Last Year:   Transportation Needs:    Film/video editor (Medical):    Lack of Transportation (Non-Medical):   Physical Activity:    Days of Exercise per Week:    Minutes of Exercise per Session:   Stress:    Feeling of Stress :   Social Connections:    Frequency of Communication with Friends and Family:    Frequency of Social Gatherings with Friends and Family:    Attends Religious Services:    Active Member of Clubs or Organizations:    Attends Music therapist:    Marital Status:   Intimate Partner Violence:    Fear of Current or Ex-Partner:    Emotionally Abused:    Physically Abused:    Sexually Abused:     Family History  Problem Relation Age of Onset   Parkinson's disease Mother    Arthritis Mother  Prostate cancer Father    Hypertension Father     BP (!) 100/64    Ht 5\' 5"  (1.651 m)    Wt 117 lb (53.1 kg)    BMI 19.47 kg/m   Review of Systems: See HPI above.     Objective:  Physical Exam:  Gen: NAD, comfortable in exam room  L Hip:  Inspection: No evidence of erythema, ecchymosis, swelling, edema  Palpation: No tenderness to palpation to greater trochanteric bursa, ASIS, AIIS. ROM: Limited IR and ER to hip due to pain Strength: Intact to resisted hip flexion/extension. Intact to resisted leg flexion/extension.  Special tests: Anterior pain with FABER, positive FADIR with reproduction of pain in the groin area for both maneuvers.  Neg log roll test. Neg Thomas test.  Negative straight leg raise bilaterally.  Diagnostic Ultrasound Evalution: [employer] E, MSK  ultrasound, MSK probe Anatomy scanned: Left hip Indication: Pain Findings: Left femoral head well visualized, along with the acetabulum.  Small labral cyst but otherwise appears within normal limits.   Assessment & Plan:  1.  Left hip osteoarthritis  Patient with known left hip osteoarthritis and continued groin pain after epidural steroid injection, suggesting intra-articular pathology rather than secondary to lumbar radiculopathy.  Discussed options with patient, she elected to proceed with left hip injection today.  We will plan to follow-up as needed based on pain, continue conservative therapies with home PT, anti-inflammatories, Tylenol.  Procedure note: Following the description of risks including infection, bleeding, damage to surrounding structures, patient provided written consent for left hip corticosteroid injection procedure.  After timeout was performed, patient was sterilely prepped in the usual fashion with alcohol swabs. Following topical anesthetization with ethyl chloride the left hip joint was identified under ultrasound guidance and injected with a solution of 4cc of bupivacaine and 1cc of Depo-Medrol.  Patient tolerated well without complication. Precautions provided.  Dagoberto Ligas, MD Sports Medicine Fellow, [place]

## 2020-02-02 DIAGNOSIS — L570 Actinic keratosis: Secondary | ICD-10-CM | POA: Diagnosis not present

## 2020-02-03 DIAGNOSIS — D485 Neoplasm of uncertain behavior of skin: Secondary | ICD-10-CM | POA: Diagnosis not present

## 2020-03-15 ENCOUNTER — Telehealth: Payer: Self-pay | Admitting: *Deleted

## 2020-03-15 DIAGNOSIS — M1612 Unilateral primary osteoarthritis, left hip: Secondary | ICD-10-CM

## 2020-03-15 MED ORDER — MELOXICAM 15 MG PO TABS
ORAL_TABLET | ORAL | 2 refills | Status: DC
Start: 2020-03-15 — End: 2021-07-10

## 2020-03-15 NOTE — Telephone Encounter (Signed)
Per Dr Buena Irish, it is too early for another injection. The earliest she can get this shot is 04/20/20. Per Dr Buena Irish, we need to get pt in to see the surgeon for a total hip replacement on the left given the pain she is having, severity of the xrays, and since the steroid injection only helped for a couple of weeks. We can also give her meloxicam 15mg  to take once a day. She can also take extra strength OR  Arthritis strength tylenol twice a day. She can take the tylenol with the meloxicam.   I contacted the patient and gave her all the above information and she is in complete agreement with our plan. Patient did not have a surgeon preference so I informed her we would make the referral to Dr Ninfa Linden at [facility] to perform the surgery. Patient was in agreement.

## 2020-03-22 ENCOUNTER — Ambulatory Visit: Payer: PPO | Admitting: Orthopaedic Surgery

## 2020-04-03 ENCOUNTER — Ambulatory Visit (INDEPENDENT_AMBULATORY_CARE_PROVIDER_SITE_OTHER): Payer: PPO | Admitting: Orthopaedic Surgery

## 2020-04-03 VITALS — Ht 65.0 in | Wt 117.0 lb

## 2020-04-03 DIAGNOSIS — M1612 Unilateral primary osteoarthritis, left hip: Secondary | ICD-10-CM | POA: Diagnosis not present

## 2020-04-03 NOTE — Progress Notes (Signed)
Office Visit Note   Patient: Tonya Ingram           Date of Birth: Nov 07, 1950           MRN: [id number] Visit Date: 04/03/2020              Requested by: Dene Gentry, MD [address] [place],  [place] [address] PCP: [facility]   Assessment & Plan: Visit Diagnoses:  1. Unilateral primary osteoarthritis, left hip     Plan: The patient does have quite significant osteoarthritis of the left hip and I agree with the need for hip replacement surgery and feel this is appropriate at this point given the failed conservative treatment with all modalities for over a year.  I explained in detail what replacement surgery involves.  We talked about intraoperative and postoperative course as well as the risk and benefits of hip replacement surgery.  I gave her a handout about this and showed her hip model.  All questions and concerns were answered and addressed.  She is interested in having this done sometime in December.  We will be in touch about scheduling surgery.  Follow-Up Instructions: Return for 2 weeks post-op.   Orders:  No orders of the defined types were placed in this encounter.  No orders of the defined types were placed in this encounter.     Procedures: No procedures performed   Clinical Data: No additional findings.   Subjective: Chief Complaint  Patient presents with  . Left Hip - Pain  The patient is a very pleasant 69 year old female who was sent from Dr. Karlton Lemon to evaluate and treat osteoarthritis that is been well-documented of the patient's left hip.  She has been having pain for over a year now.  She is an avid runner.  She has had intra-articular steroid injections which only helped for a couple weeks.  Her pain is mainly in the groin.  She denies any specific injury.  She has tried and failed conservative treatment for over a year now.  At this point her left hip pain is definitely affecting her mobility, her  quality of life and activities day living.  She is interested in direct anterior hip replacement surgery.  She has no acute medical issues.  HPI  Review of Systems She currently denies any headache, chest pain, shortness of breath, fever, chills, nausea, vomiting  Objective: Vital Signs: Ht 5\' 5"  (1.651 m)   Wt 117 lb (53.1 kg)   BMI 19.47 kg/m   Physical Exam She is alert and orient x3 and in no acute distress Ortho Exam Examination of her right hip shows it moves smoothly and fluidly with no issues at all.  Examination of the left painful hip shows stiffness with internal and external rotation as well as pain in the groin with compression and rotation of the hip. Specialty Comments:  No specialty comments available.  Imaging: No results found. An AP pelvis and lateral left hip show significant arthritic changes of the left hip with joint space narrowing in particular osteophytes.  There is also sclerotic changes in the femoral head and acetabulum.  PMFS History: Patient Active Problem List   Diagnosis Date Noted  . Unilateral primary osteoarthritis, left hip 04/03/2020   Past Medical History:  Diagnosis Date  . Arthritis   . Thyroid disease     Family History  Problem Relation Age of Onset  . Parkinson's disease Mother   . Arthritis Mother   .  Prostate cancer Father   . Hypertension Father     Past Surgical History:  Procedure Laterality Date  . THYROIDECTOMY    . TUBAL LIGATION     Social History   Occupational History  . Not on file  Tobacco Use  . Smoking status: Never Smoker  . Smokeless tobacco: Never Used  Substance and Sexual Activity  . Alcohol use: Never  . Drug use: Not on file  . Sexual activity: Not on file

## 2020-04-18 ENCOUNTER — Other Ambulatory Visit: Payer: Self-pay

## 2020-04-18 DIAGNOSIS — M5416 Radiculopathy, lumbar region: Secondary | ICD-10-CM

## 2020-04-18 NOTE — Progress Notes (Signed)
Pt is requesting another ESI, its been 4 months since the 1st one.

## 2020-04-19 ENCOUNTER — Other Ambulatory Visit: Payer: Self-pay | Admitting: Family Medicine

## 2020-04-19 DIAGNOSIS — M5416 Radiculopathy, lumbar region: Secondary | ICD-10-CM

## 2020-04-24 ENCOUNTER — Ambulatory Visit
Admission: RE | Admit: 2020-04-24 | Discharge: 2020-04-24 | Disposition: A | Discharge status: Home / Self Care | Payer: PPO | Source: Ambulatory Visit | Attending: Family Medicine | Admitting: Family Medicine

## 2020-04-24 ENCOUNTER — Other Ambulatory Visit: Payer: Self-pay

## 2020-04-24 DIAGNOSIS — M5126 Other intervertebral disc displacement, lumbar region: Secondary | ICD-10-CM | POA: Diagnosis not present

## 2020-04-24 DIAGNOSIS — M5416 Radiculopathy, lumbar region: Secondary | ICD-10-CM

## 2020-04-24 MED ORDER — IOPAMIDOL (ISOVUE-M 200) INJECTION 41%
1.0000 mL | Freq: Once | INTRAMUSCULAR | Status: AC
  Administered 2020-04-24: 1 mL via EPIDURAL

## 2020-04-24 MED ORDER — METHYLPREDNISOLONE ACETATE 40 MG/ML INJ SUSP (RADIOLOG
120.0000 mg | Freq: Once | INTRAMUSCULAR | Status: AC
  Administered 2020-04-24: 120 mg via EPIDURAL

## 2020-04-24 NOTE — Discharge Instructions (Signed)

## 2020-05-22 ENCOUNTER — Other Ambulatory Visit: Payer: Self-pay

## 2020-05-23 ENCOUNTER — Other Ambulatory Visit: Payer: Self-pay

## 2020-06-22 ENCOUNTER — Telehealth: Payer: Self-pay

## 2020-06-22 NOTE — Telephone Encounter (Signed)
Letter completed. Lvm informing pt is was ready for pickup at the front desk

## 2020-06-22 NOTE — Telephone Encounter (Signed)
Patient called she is requesting a letter for her job stating she is having surgery 06/30/20 and she will follow up with Dr.Blackman 07/13/20 and return back to work date will be determined. [phone]

## 2020-06-22 NOTE — Progress Notes (Signed)
DUE TO COVID-19 ONLY ONE VISITOR IS ALLOWED TO COME WITH YOU AND STAY IN THE WAITING ROOM ONLY DURING PRE OP AND PROCEDURE DAY OF SURGERY. THE 1 VISITOR  MAY VISIT WITH YOU AFTER SURGERY IN YOUR PRIVATE ROOM DURING VISITING HOURS ONLY!  YOU NEED TO HAVE A COVID 19 TEST ON__1/09/2020 _____ @_______ , THIS TEST MUST BE DONE BEFORE SURGERY,  COVID TESTING SITE [address] [place] , IT IS ON THE RIGHT GOING OUT [address] APPROXIMATELY  2 MINUTES PAST [employer] ON THE RIGHT. ONCE YOUR COVID TEST IS COMPLETED,  PLEASE BEGIN THE QUARANTINE INSTRUCTIONS AS OUTLINED IN YOUR HANDOUT.                Tonya Ingram  06/22/2020   Your procedure is scheduled on: 06/30/2020    Report to [facility] Main  Entrance   Report to admitting at   0715 AM     Call this number if you have problems the morning of surgery [phone]    REMEMBER: NO  SOLID FOOD CANDY OR GUM AFTER MIDNIGHT. CLEAR LIQUIDS UNTIL    0645am      . NOTHING BY MOUTH EXCEPT CLEAR LIQUIDS UNTIL    . PLEASE FINISH ENSURE DRINK PER SURGEON ORDER  WHICH NEEDS TO BE COMPLETED AT  0645am     .      CLEAR LIQUID DIET   Foods Allowed                                                                    Coffee and tea, regular and decaf                            Fruit ices (not with fruit pulp)                                      Iced Popsicles                                    Carbonated beverages, regular and diet                                    Cranberry, grape and apple juices Sports drinks like Gatorade Lightly seasoned clear broth or consume(fat free) Sugar, honey syrup ___________________________________________________________________      BRUSH YOUR TEETH MORNING OF SURGERY AND RINSE YOUR MOUTH OUT, NO CHEWING GUM CANDY OR MINTS.     Take these medicines the morning of surgery with A SIP OF WATER: Synthroid   DO NOT TAKE ANY DIABETIC MEDICATIONS DAY OF YOUR SURGERY                                You may not have any metal on your body including hair pins and              piercings  Do not wear jewelry,  make-up, lotions, powders or perfumes, deodorant             Do not wear nail polish on your fingernails.  Do not shave  48 hours prior to surgery.              Men may shave face and neck.   Do not bring valuables to the hospital. [place].  Contacts, dentures or bridgework may not be worn into surgery.  Leave suitcase in the car. After surgery it may be brought to your room.     Patients discharged the day of surgery will not be allowed to drive home. IF YOU ARE HAVING SURGERY AND GOING HOME THE SAME DAY, YOU MUST HAVE AN ADULT TO DRIVE YOU HOME AND BE WITH YOU FOR 24 HOURS. YOU MAY GO HOME BY TAXI OR UBER OR ORTHERWISE, BUT AN ADULT MUST ACCOMPANY YOU HOME AND STAY WITH YOU FOR 24 HOURS.  Name and phone number of your driver:  Special Instructions: N/A              Please read over the following fact sheets you were given: _____________________________________________________________________  [facility] - Preparing for Surgery Before surgery, you can play an important role.  Because skin is not sterile, your skin needs to be as free of germs as possible.  You can reduce the number of germs on your skin by washing with CHG (chlorahexidine gluconate) soap before surgery.  CHG is an antiseptic cleaner which kills germs and bonds with the skin to continue killing germs even after washing. Please DO NOT use if you have an allergy to CHG or antibacterial soaps.  If your skin becomes reddened/irritated stop using the CHG and inform your nurse when you arrive at Short Stay. Do not shave (including legs and underarms) for at least 48 hours prior to the first CHG shower.  You may shave your face/neck. Please follow these instructions carefully:  1.  Shower with CHG Soap the night before surgery and the  morning of  Surgery.  2.  If you choose to wash your hair, wash your hair first as usual with your  normal  shampoo.  3.  After you shampoo, rinse your hair and body thoroughly to remove the  shampoo.                           4.  Use CHG as you would any other liquid soap.  You can apply chg directly  to the skin and wash                       Gently with a scrungie or clean washcloth.  5.  Apply the CHG Soap to your body ONLY FROM THE NECK DOWN.   Do not use on face/ open                           Wound or open sores. Avoid contact with eyes, ears mouth and genitals (private parts).                       Wash face,  Genitals (private parts) with your normal soap.             6.  Wash thoroughly, paying special attention  to the area where your surgery  will be performed.  7.  Thoroughly rinse your body with warm water from the neck down.  8.  DO NOT shower/wash with your normal soap after using and rinsing off  the CHG Soap.                9.  Pat yourself dry with a clean towel.            10.  Wear clean pajamas.            11.  Place clean sheets on your bed the night of your first shower and do not  sleep with pets. Day of Surgery : Do not apply any lotions/deodorants the morning of surgery.  Please wear clean clothes to the hospital/surgery center.  FAILURE TO FOLLOW THESE INSTRUCTIONS MAY RESULT IN THE CANCELLATION OF YOUR SURGERY PATIENT SIGNATURE_________________________________  NURSE SIGNATURE__________________________________  ________________________________________________________________________

## 2020-06-26 ENCOUNTER — Encounter (HOSPITAL_COMMUNITY)
Admission: RE | Admit: 2020-06-26 | Discharge: 2020-06-26 | Disposition: A | Discharge status: Home / Self Care | Payer: PPO | Source: Ambulatory Visit | Attending: Orthopaedic Surgery | Admitting: Orthopaedic Surgery

## 2020-06-26 ENCOUNTER — Encounter (HOSPITAL_COMMUNITY): Payer: Self-pay

## 2020-06-26 ENCOUNTER — Other Ambulatory Visit: Payer: Self-pay

## 2020-06-26 DIAGNOSIS — Z01812 Encounter for preprocedural laboratory examination: Secondary | ICD-10-CM | POA: Diagnosis not present

## 2020-06-26 HISTORY — DX: Depression, unspecified: F32.A

## 2020-06-26 HISTORY — DX: Hypothyroidism, unspecified: E03.9

## 2020-06-26 HISTORY — DX: Gastro-esophageal reflux disease without esophagitis: K21.9

## 2020-06-26 HISTORY — DX: Raynaud's syndrome without gangrene: I73.00

## 2020-06-26 LAB — CBC
HCT: 37.4 % (ref 36.0–46.0)
Hemoglobin: 12.2 g/dL (ref 12.0–15.0)
MCH: 31.1 pg (ref 26.0–34.0)
MCHC: 32.6 g/dL (ref 30.0–36.0)
MCV: 95.4 fL (ref 80.0–100.0)
Platelets: 357 10*3/uL (ref 150–400)
RBC: 3.92 MIL/uL (ref 3.87–5.11)
RDW: 12.6 % (ref 11.5–15.5)
WBC: 6.2 10*3/uL (ref 4.0–10.5)
nRBC: 0 % (ref 0.0–0.2)

## 2020-06-26 LAB — BASIC METABOLIC PANEL
Anion gap: 9 (ref 5–15)
BUN: 15 mg/dL (ref 8–23)
CO2: 24 mmol/L (ref 22–32)
Calcium: 9.5 mg/dL (ref 8.9–10.3)
Chloride: 104 mmol/L (ref 98–111)
Creatinine, Ser: 0.8 mg/dL (ref 0.44–1.00)
GFR, Estimated: >60 mL/min (ref >60)
Glucose, Bld: 83 mg/dL (ref 70–99)
Potassium: 4.3 mmol/L (ref 3.5–5.1)
Sodium: 137 mmol/L (ref 135–145)

## 2020-06-26 LAB — SURGICAL PCR SCREEN
MRSA, PCR: NEGATIVE
Staphylococcus aureus: NEGATIVE

## 2020-06-27 ENCOUNTER — Other Ambulatory Visit (HOSPITAL_COMMUNITY)
Admission: RE | Admit: 2020-06-27 | Discharge: 2020-06-27 | Disposition: A | Discharge status: Home / Self Care | Payer: PPO | Source: Ambulatory Visit | Attending: Orthopaedic Surgery | Admitting: Orthopaedic Surgery

## 2020-06-27 DIAGNOSIS — Z20822 Contact with and (suspected) exposure to covid-19: Secondary | ICD-10-CM | POA: Insufficient documentation

## 2020-06-27 DIAGNOSIS — Z01818 Encounter for other preprocedural examination: Secondary | ICD-10-CM | POA: Insufficient documentation

## 2020-06-28 LAB — SARS CORONAVIRUS 2 (TAT 6-24 HRS): SARS Coronavirus 2: NEGATIVE

## 2020-06-29 NOTE — H&P (Signed)
TOTAL HIP ADMISSION H&P  Patient is admitted for left total hip arthroplasty.  Subjective:  Chief Complaint: left hip pain  HPI: Tonya Ingram, 70 y.o. female, has a history of pain and functional disability in the left hip(s) due to arthritis and patient has failed non-surgical conservative treatments for greater than 12 weeks to include NSAID's and/or analgesics, corticosteriod injections, flexibility and strengthening excercises and activity modification.  Onset of symptoms was abrupt starting 1 years ago with gradually worsening course since that time.The patient noted no past surgery on the left hip(s).  Patient currently rates pain in the left hip at 10 out of 10 with activity. Patient has night pain, worsening of pain with activity and weight bearing and pain that interfers with activities of daily living. Patient has evidence of subchondral sclerosis, periarticular osteophytes and joint space narrowing by imaging studies. This condition presents safety issues increasing the risk of falls.  There is no current active infection.  Patient Active Problem List   Diagnosis Date Noted  . Unilateral primary osteoarthritis, left hip 04/03/2020   Past Medical History:  Diagnosis Date  . Arthritis   . Depression   . GERD (gastroesophageal reflux disease)   . Hypothyroidism   . Raynaud's disease   . Thyroid disease     Past Surgical History:  Procedure Laterality Date  . THYROIDECTOMY    . TUBAL LIGATION      No current facility-administered medications for this encounter.   Current Outpatient Medications  Medication Sig Dispense Refill Last Dose  . Calcium Carbonate-Vitamin D (SM CALCIUM 600/VITAMIN D) 600-400 MG-UNIT tablet Take 1 tablet by mouth daily.     . furosemide (LASIX) 20 MG tablet Take 10 mg by mouth daily as needed for edema.     Marland Kitchen levothyroxine (SYNTHROID) 50 MCG tablet Take 50 mcg by mouth daily before breakfast.     . magnesium oxide (MAG-OX) 400 MG tablet Take 400 mg  by mouth daily.     . meloxicam (MOBIC) 15 MG tablet Take one pill a day with food for 7 days and then prn thereafter (Patient taking differently: Take 15 mg by mouth daily.) 40 tablet 2   . NONFORMULARY OR COMPOUNDED ITEM Apply 1 application topically daily. Hormone Cream : estrogen and progesterone mixed     . escitalopram (LEXAPRO) 10 MG tablet Take 10 mg by mouth daily.      Allergies  Allergen Reactions  . Codeine Nausea And Vomiting    Social History   Tobacco Use  . Smoking status: Never Smoker  . Smokeless tobacco: Never Used  Substance Use Topics  . Alcohol use: Never    Family History  Problem Relation Age of Onset  . Parkinson's disease Mother   . Arthritis Mother   . Prostate cancer Father   . Hypertension Father      Review of Systems  Musculoskeletal: Positive for gait problem.  All other systems reviewed and are negative.   Objective:  Physical Exam Vitals reviewed.  Constitutional:      Appearance: Normal appearance.  HENT:     Head: Normocephalic and atraumatic.  Eyes:     Extraocular Movements: Extraocular movements intact.     Pupils: Pupils are equal, round, and reactive to light.  Cardiovascular:     Rate and Rhythm: Normal rate.     Pulses: Normal pulses.  Pulmonary:     Effort: Pulmonary effort is normal.  Abdominal:     Palpations: Abdomen is soft.  Musculoskeletal:  Cervical back: Normal range of motion.     Left hip: Tenderness and bony tenderness present. Decreased range of motion. Decreased strength.  Neurological:     Mental Status: She is alert and oriented to person, place, and time.  Psychiatric:        Behavior: Behavior normal.     Vital signs in last 24 hours:    Labs:   Estimated body mass index is 19.47 kg/m as calculated from the following:   Height as of 06/26/20: 5\' 5"  (1.651 m).   Weight as of 04/03/20: 53.1 kg.   Imaging Review Plain radiographs demonstrate severe degenerative joint disease of the left  hip(s). The bone quality appears to be excellent for age and reported activity level.      Assessment/Plan:  End stage arthritis, left hip(s)  The patient history, physical examination, clinical judgement of the provider and imaging studies are consistent with end stage degenerative joint disease of the left hip(s) and total hip arthroplasty is deemed medically necessary. The treatment options including medical management, injection therapy, arthroscopy and arthroplasty were discussed at length. The risks and benefits of total hip arthroplasty were presented and reviewed. The risks due to aseptic loosening, infection, stiffness, dislocation/subluxation,  thromboembolic complications and other imponderables were discussed.  The patient acknowledged the explanation, agreed to proceed with the plan and consent was signed. Patient is being admitted for inpatient treatment for surgery, pain control, PT, OT, prophylactic antibiotics, VTE prophylaxis, progressive ambulation and ADL's and discharge planning.The patient is planning to be discharged home with home health services

## 2020-06-30 ENCOUNTER — Encounter (HOSPITAL_COMMUNITY): Payer: Self-pay | Admitting: Orthopaedic Surgery

## 2020-06-30 ENCOUNTER — Observation Stay (HOSPITAL_COMMUNITY): Payer: PPO

## 2020-06-30 ENCOUNTER — Observation Stay (HOSPITAL_COMMUNITY)
Admission: RE | Admit: 2020-06-30 | Discharge: 2020-07-01 | Disposition: A | Discharge status: Home / Self Care | Payer: PPO | Source: Other Acute Inpatient Hospital | Attending: Orthopaedic Surgery | Admitting: Orthopaedic Surgery

## 2020-06-30 ENCOUNTER — Ambulatory Visit (HOSPITAL_COMMUNITY): Payer: PPO | Admitting: Physician Assistant

## 2020-06-30 ENCOUNTER — Encounter (HOSPITAL_COMMUNITY)
Admission: RE | Disposition: A | Discharge status: Home / Self Care | Payer: Self-pay | Source: Other Acute Inpatient Hospital | Attending: Orthopaedic Surgery

## 2020-06-30 ENCOUNTER — Other Ambulatory Visit: Payer: Self-pay

## 2020-06-30 ENCOUNTER — Ambulatory Visit (HOSPITAL_COMMUNITY): Payer: PPO

## 2020-06-30 DIAGNOSIS — Z79899 Other long term (current) drug therapy: Secondary | ICD-10-CM | POA: Insufficient documentation

## 2020-06-30 DIAGNOSIS — Z96642 Presence of left artificial hip joint: Secondary | ICD-10-CM

## 2020-06-30 DIAGNOSIS — E039 Hypothyroidism, unspecified: Secondary | ICD-10-CM | POA: Insufficient documentation

## 2020-06-30 DIAGNOSIS — K219 Gastro-esophageal reflux disease without esophagitis: Secondary | ICD-10-CM | POA: Diagnosis not present

## 2020-06-30 DIAGNOSIS — Z471 Aftercare following joint replacement surgery: Secondary | ICD-10-CM | POA: Diagnosis not present

## 2020-06-30 DIAGNOSIS — F32A Depression, unspecified: Secondary | ICD-10-CM | POA: Diagnosis not present

## 2020-06-30 DIAGNOSIS — M1612 Unilateral primary osteoarthritis, left hip: Principal | ICD-10-CM | POA: Insufficient documentation

## 2020-06-30 DIAGNOSIS — S82122A Displaced fracture of lateral condyle of left tibia, initial encounter for closed fracture: Secondary | ICD-10-CM

## 2020-06-30 HISTORY — PX: TOTAL HIP ARTHROPLASTY: SHX124

## 2020-06-30 SURGERY — ARTHROPLASTY, HIP, TOTAL, ANTERIOR APPROACH
Anesthesia: Spinal | Site: Hip | Laterality: Left

## 2020-06-30 MED ORDER — MAGNESIUM OXIDE 400 (241.3 MG) MG PO TABS
400.0000 mg | ORAL_TABLET | Freq: Every day | ORAL | Status: DC
  Administered 2020-06-30 – 2020-07-01 (×2): 400 mg via ORAL
  Filled 2020-06-30 (×2): qty 1

## 2020-06-30 MED ORDER — OXYCODONE HCL 5 MG PO TABS
5.0000 mg | ORAL_TABLET | ORAL | Status: DC | PRN
  Administered 2020-06-30 (×2): 5 mg via ORAL
  Filled 2020-06-30: qty 2
  Filled 2020-06-30 (×2): qty 1

## 2020-06-30 MED ORDER — STERILE WATER FOR IRRIGATION IR SOLN
Status: DC | PRN
  Administered 2020-06-30: 2000 mL

## 2020-06-30 MED ORDER — SODIUM CHLORIDE 0.9 % IR SOLN
Status: DC | PRN
  Administered 2020-06-30: 1000 mL

## 2020-06-30 MED ORDER — PROPOFOL 500 MG/50ML IV EMUL
INTRAVENOUS | Status: DC | PRN
  Administered 2020-06-30: 25 ug/kg/min via INTRAVENOUS

## 2020-06-30 MED ORDER — ACETAMINOPHEN 325 MG PO TABS
325.0000 mg | ORAL_TABLET | Freq: Four times a day (QID) | ORAL | Status: DC | PRN
Start: 2020-07-01 — End: 2020-07-01
  Administered 2020-06-30 – 2020-07-01 (×2): 650 mg via ORAL
  Filled 2020-06-30 (×2): qty 2

## 2020-06-30 MED ORDER — SODIUM CHLORIDE 0.9 % IV SOLN: INTRAVENOUS | Status: DC

## 2020-06-30 MED ORDER — DEXAMETHASONE SODIUM PHOSPHATE 10 MG/ML IJ SOLN
INTRAMUSCULAR | Status: DC | PRN
  Administered 2020-06-30: 10 mg via INTRAVENOUS

## 2020-06-30 MED ORDER — ONDANSETRON HCL 4 MG PO TABS
4.0000 mg | ORAL_TABLET | Freq: Four times a day (QID) | ORAL | Status: DC | PRN
  Filled 2020-06-30: qty 1

## 2020-06-30 MED ORDER — PHENYLEPHRINE HCL-NACL 10-0.9 MG/250ML-% IV SOLN
INTRAVENOUS | Status: DC | PRN
  Administered 2020-06-30: 25 ug/min via INTRAVENOUS

## 2020-06-30 MED ORDER — OXYCODONE HCL 5 MG PO TABS: 10.0000 mg | ORAL_TABLET | ORAL | Status: DC | PRN

## 2020-06-30 MED ORDER — FENTANYL CITRATE (PF) 250 MCG/5ML IJ SOLN
INTRAMUSCULAR | Status: DC | PRN
  Administered 2020-06-30: 50 ug via INTRAVENOUS

## 2020-06-30 MED ORDER — GABAPENTIN 100 MG PO CAPS
100.0000 mg | ORAL_CAPSULE | Freq: Three times a day (TID) | ORAL | Status: DC
  Administered 2020-06-30 – 2020-07-01 (×3): 100 mg via ORAL
  Filled 2020-06-30 (×3): qty 1

## 2020-06-30 MED ORDER — 0.9 % SODIUM CHLORIDE (POUR BTL) OPTIME
TOPICAL | Status: DC | PRN
  Administered 2020-06-30: 1000 mL

## 2020-06-30 MED ORDER — ONDANSETRON HCL 4 MG/2ML IJ SOLN: 4.0000 mg | Freq: Once | INTRAMUSCULAR | Status: DC | PRN

## 2020-06-30 MED ORDER — TRANEXAMIC ACID-NACL 1000-0.7 MG/100ML-% IV SOLN
INTRAVENOUS | Status: AC
  Filled 2020-06-30: qty 100

## 2020-06-30 MED ORDER — METHOCARBAMOL 500 MG IVPB - SIMPLE MED
500.0000 mg | Freq: Four times a day (QID) | INTRAVENOUS | Status: DC | PRN
  Filled 2020-06-30: qty 50

## 2020-06-30 MED ORDER — HYDROMORPHONE HCL 1 MG/ML IJ SOLN: 0.5000 mg | INTRAMUSCULAR | Status: DC | PRN

## 2020-06-30 MED ORDER — ACETAMINOPHEN 325 MG PO TABS
650.0000 mg | ORAL_TABLET | Freq: Four times a day (QID) | ORAL | Status: DC | PRN
  Administered 2020-07-01: 650 mg via ORAL
  Filled 2020-06-30: qty 2

## 2020-06-30 MED ORDER — LIDOCAINE 2% (20 MG/ML) 5 ML SYRINGE
INTRAMUSCULAR | Status: DC | PRN
  Administered 2020-06-30: 20 mg via INTRAVENOUS

## 2020-06-30 MED ORDER — CEFAZOLIN SODIUM-DEXTROSE 2-4 GM/100ML-% IV SOLN
2.0000 g | INTRAVENOUS | Status: AC
  Administered 2020-06-30: 2 g via INTRAVENOUS
  Filled 2020-06-30: qty 100

## 2020-06-30 MED ORDER — PHENYLEPHRINE HCL (PRESSORS) 10 MG/ML IV SOLN
INTRAVENOUS | Status: AC
  Filled 2020-06-30: qty 12

## 2020-06-30 MED ORDER — ONDANSETRON HCL 4 MG/2ML IJ SOLN
INTRAMUSCULAR | Status: DC | PRN
  Administered 2020-06-30: 4 mg via INTRAVENOUS

## 2020-06-30 MED ORDER — METHOCARBAMOL 500 MG PO TABS
500.0000 mg | ORAL_TABLET | Freq: Four times a day (QID) | ORAL | Status: DC | PRN
  Administered 2020-07-01 (×2): 500 mg via ORAL
  Filled 2020-06-30 (×3): qty 1

## 2020-06-30 MED ORDER — FENTANYL CITRATE (PF) 100 MCG/2ML IJ SOLN: 25.0000 ug | INTRAMUSCULAR | Status: DC | PRN

## 2020-06-30 MED ORDER — ONDANSETRON HCL 4 MG/2ML IJ SOLN: 4.0000 mg | Freq: Four times a day (QID) | INTRAMUSCULAR | Status: DC | PRN

## 2020-06-30 MED ORDER — CEFAZOLIN SODIUM-DEXTROSE 1-4 GM/50ML-% IV SOLN
1.0000 g | Freq: Four times a day (QID) | INTRAVENOUS | Status: AC
  Administered 2020-06-30 (×2): 1 g via INTRAVENOUS
  Filled 2020-06-30 (×2): qty 50

## 2020-06-30 MED ORDER — ONDANSETRON HCL 4 MG/2ML IJ SOLN
INTRAMUSCULAR | Status: AC
  Filled 2020-06-30: qty 4

## 2020-06-30 MED ORDER — FENTANYL CITRATE (PF) 250 MCG/5ML IJ SOLN
INTRAMUSCULAR | Status: AC
  Filled 2020-06-30: qty 5

## 2020-06-30 MED ORDER — PROPOFOL 1000 MG/100ML IV EMUL
INTRAVENOUS | Status: AC
  Filled 2020-06-30: qty 600

## 2020-06-30 MED ORDER — EPHEDRINE 5 MG/ML INJ
INTRAVENOUS | Status: AC
  Filled 2020-06-30: qty 10

## 2020-06-30 MED ORDER — METOCLOPRAMIDE HCL 5 MG/ML IJ SOLN: 5.0000 mg | Freq: Three times a day (TID) | INTRAMUSCULAR | Status: DC | PRN

## 2020-06-30 MED ORDER — BUPIVACAINE IN DEXTROSE 0.75-8.25 % IT SOLN
INTRATHECAL | Status: DC | PRN
  Administered 2020-06-30: 1.4 mL via INTRATHECAL

## 2020-06-30 MED ORDER — POVIDONE-IODINE 10 % EX SWAB
2.0000 "application " | Freq: Once | CUTANEOUS | Status: AC
  Administered 2020-06-30: 2 via TOPICAL

## 2020-06-30 MED ORDER — PANTOPRAZOLE SODIUM 40 MG PO TBEC
40.0000 mg | DELAYED_RELEASE_TABLET | Freq: Every day | ORAL | Status: DC
  Administered 2020-06-30 – 2020-07-01 (×2): 40 mg via ORAL
  Filled 2020-06-30 (×2): qty 1

## 2020-06-30 MED ORDER — ALUM & MAG HYDROXIDE-SIMETH 200-200-20 MG/5ML PO SUSP: 30.0000 mL | ORAL | Status: DC | PRN

## 2020-06-30 MED ORDER — LIDOCAINE HCL (PF) 2 % IJ SOLN
INTRAMUSCULAR | Status: AC
  Filled 2020-06-30: qty 10

## 2020-06-30 MED ORDER — ASPIRIN 81 MG PO CHEW
81.0000 mg | CHEWABLE_TABLET | Freq: Two times a day (BID) | ORAL | Status: DC
  Administered 2020-06-30 – 2020-07-01 (×2): 81 mg via ORAL
  Filled 2020-06-30 (×2): qty 1

## 2020-06-30 MED ORDER — METOCLOPRAMIDE HCL 5 MG PO TABS
5.0000 mg | ORAL_TABLET | Freq: Three times a day (TID) | ORAL | Status: DC | PRN
  Filled 2020-06-30: qty 2

## 2020-06-30 MED ORDER — ACETAMINOPHEN 500 MG PO TABS
1000.0000 mg | ORAL_TABLET | Freq: Once | ORAL | Status: AC
  Administered 2020-06-30: 1000 mg via ORAL
  Filled 2020-06-30: qty 2

## 2020-06-30 MED ORDER — DOCUSATE SODIUM 100 MG PO CAPS
100.0000 mg | ORAL_CAPSULE | Freq: Two times a day (BID) | ORAL | Status: DC
  Filled 2020-06-30: qty 1

## 2020-06-30 MED ORDER — MENTHOL 3 MG MT LOZG: 1.0000 | LOZENGE | OROMUCOSAL | Status: DC | PRN

## 2020-06-30 MED ORDER — PHENOL 1.4 % MT LIQD: 1.0000 | OROMUCOSAL | Status: DC | PRN

## 2020-06-30 MED ORDER — PHENYLEPHRINE 40 MCG/ML (10ML) SYRINGE FOR IV PUSH (FOR BLOOD PRESSURE SUPPORT): PREFILLED_SYRINGE | INTRAVENOUS | Status: DC | PRN

## 2020-06-30 MED ORDER — DIPHENHYDRAMINE HCL 12.5 MG/5ML PO ELIX: 12.5000 mg | ORAL_SOLUTION | ORAL | Status: DC | PRN

## 2020-06-30 MED ORDER — DEXAMETHASONE SODIUM PHOSPHATE 10 MG/ML IJ SOLN
INTRAMUSCULAR | Status: AC
  Filled 2020-06-30: qty 2

## 2020-06-30 MED ORDER — PROPOFOL 500 MG/50ML IV EMUL
INTRAVENOUS | Status: AC
  Filled 2020-06-30: qty 200

## 2020-06-30 MED ORDER — ESCITALOPRAM OXALATE 10 MG PO TABS
10.0000 mg | ORAL_TABLET | Freq: Every day | ORAL | Status: DC
  Filled 2020-06-30 (×2): qty 1

## 2020-06-30 MED ORDER — ORAL CARE MOUTH RINSE: 15.0000 mL | Freq: Once | OROMUCOSAL | Status: AC

## 2020-06-30 MED ORDER — MIDAZOLAM HCL 2 MG/2ML IJ SOLN
INTRAMUSCULAR | Status: DC | PRN
  Administered 2020-06-30: 1 mg via INTRAVENOUS

## 2020-06-30 MED ORDER — LEVOTHYROXINE SODIUM 50 MCG PO TABS
50.0000 ug | ORAL_TABLET | Freq: Every day | ORAL | Status: DC
  Administered 2020-07-01: 50 ug via ORAL
  Filled 2020-06-30: qty 1

## 2020-06-30 MED ORDER — PROPOFOL 10 MG/ML IV BOLUS
INTRAVENOUS | Status: DC | PRN
  Administered 2020-06-30 (×2): 20 mg via INTRAVENOUS

## 2020-06-30 MED ORDER — TRANEXAMIC ACID-NACL 1000-0.7 MG/100ML-% IV SOLN
INTRAVENOUS | Status: DC | PRN
  Administered 2020-06-30: 1000 mg via INTRAVENOUS

## 2020-06-30 MED ORDER — EPHEDRINE SULFATE-NACL 50-0.9 MG/10ML-% IV SOSY
PREFILLED_SYRINGE | INTRAVENOUS | Status: DC | PRN
  Administered 2020-06-30: 5 mg via INTRAVENOUS
  Administered 2020-06-30: 10 mg via INTRAVENOUS

## 2020-06-30 MED ORDER — ROCURONIUM BROMIDE 10 MG/ML (PF) SYRINGE
PREFILLED_SYRINGE | INTRAVENOUS | Status: AC
  Filled 2020-06-30: qty 10

## 2020-06-30 MED ORDER — CHLORHEXIDINE GLUCONATE 0.12 % MT SOLN
15.0000 mL | Freq: Once | OROMUCOSAL | Status: AC
  Administered 2020-06-30: 15 mL via OROMUCOSAL

## 2020-06-30 MED ORDER — CALCIUM CARBONATE-VITAMIN D 500-200 MG-UNIT PO TABS
1.0000 | ORAL_TABLET | Freq: Every day | ORAL | Status: DC
  Administered 2020-06-30 – 2020-07-01 (×2): 1 via ORAL
  Filled 2020-06-30 (×2): qty 1

## 2020-06-30 MED ORDER — LACTATED RINGERS IV SOLN: INTRAVENOUS | Status: DC

## 2020-06-30 MED ORDER — MIDAZOLAM HCL 2 MG/2ML IJ SOLN
INTRAMUSCULAR | Status: AC
  Filled 2020-06-30: qty 2

## 2020-06-30 MED ORDER — SUCCINYLCHOLINE CHLORIDE 200 MG/10ML IV SOSY
PREFILLED_SYRINGE | INTRAVENOUS | Status: AC
  Filled 2020-06-30: qty 20

## 2020-06-30 SURGICAL SUPPLY — 41 items
BAG ZIPLOCK 12X15 (MISCELLANEOUS) IMPLANT
BENZOIN TINCTURE PRP APPL 2/3 (GAUZE/BANDAGES/DRESSINGS) ×3 IMPLANT
BLADE SAW SGTL 18X1.27X75 (BLADE) ×2 IMPLANT
BLADE SAW SGTL 18X1.27X75MM (BLADE) ×1
CLOSURE STERI-STRIP 1/2X4 (GAUZE/BANDAGES/DRESSINGS) ×1
CLOSURE WOUND 1/2 X4 (GAUZE/BANDAGES/DRESSINGS)
CLSR STERI-STRIP ANTIMIC 1/2X4 (GAUZE/BANDAGES/DRESSINGS) ×2 IMPLANT
COVER PERINEAL POST (MISCELLANEOUS) ×3 IMPLANT
COVER SURGICAL LIGHT HANDLE (MISCELLANEOUS) ×3 IMPLANT
COVER WAND RF STERILE (DRAPES) ×3 IMPLANT
CUP SECTOR GRIPTON 50MM (Cup) ×3 IMPLANT
DRAPE STERI IOBAN 125X83 (DRAPES) ×3 IMPLANT
DRAPE U-SHAPE 47X51 STRL (DRAPES) ×6 IMPLANT
DRSG AQUACEL AG ADV 3.5X10 (GAUZE/BANDAGES/DRESSINGS) ×3 IMPLANT
DURAPREP 26ML APPLICATOR (WOUND CARE) ×3 IMPLANT
ELECT REM PT RETURN 15FT ADLT (MISCELLANEOUS) ×3 IMPLANT
GAUZE XEROFORM 1X8 LF (GAUZE/BANDAGES/DRESSINGS) ×3 IMPLANT
GLOVE BIO SURGEON STRL SZ7.5 (GLOVE) ×3 IMPLANT
GLOVE ECLIPSE 8.0 STRL XLNG CF (GLOVE) ×3 IMPLANT
GLOVE SRG 8 PF TXTR STRL LF DI (GLOVE) ×2 IMPLANT
GLOVE SURG UNDER POLY LF SZ8 (GLOVE) ×4
GOWN STRL REUS W/TWL XL LVL3 (GOWN DISPOSABLE) ×6 IMPLANT
HANDPIECE INTERPULSE COAX TIP (DISPOSABLE) ×2
HEAD FEM STD 32X+1 STRL (Hips) ×3 IMPLANT
HOLDER FOLEY CATH W/STRAP (MISCELLANEOUS) ×3 IMPLANT
KIT TURNOVER KIT A (KITS) IMPLANT
LINER ACETABULAR 32X50 (Liner) ×3 IMPLANT
PACK ANTERIOR HIP CUSTOM (KITS) ×3 IMPLANT
PENCIL SMOKE EVACUATOR (MISCELLANEOUS) IMPLANT
SET HNDPC FAN SPRY TIP SCT (DISPOSABLE) ×1 IMPLANT
STAPLER VISISTAT 35W (STAPLE) IMPLANT
STEM CORAIL KA10 (Stem) ×3 IMPLANT
STRIP CLOSURE SKIN 1/2X4 (GAUZE/BANDAGES/DRESSINGS) IMPLANT
SUT ETHIBOND NAB CT1 #1 30IN (SUTURE) ×3 IMPLANT
SUT ETHILON 2 0 PS N (SUTURE) IMPLANT
SUT MNCRL AB 4-0 PS2 18 (SUTURE) IMPLANT
SUT VIC AB 0 CT1 36 (SUTURE) ×3 IMPLANT
SUT VIC AB 1 CT1 36 (SUTURE) ×3 IMPLANT
SUT VIC AB 2-0 CT1 27 (SUTURE) ×4
SUT VIC AB 2-0 CT1 TAPERPNT 27 (SUTURE) ×2 IMPLANT
TRAY FOLEY MTR SLVR 16FR STAT (SET/KITS/TRAYS/PACK) IMPLANT

## 2020-06-30 NOTE — Plan of Care (Signed)
Patient admitted to unit careplans initiated

## 2020-06-30 NOTE — Plan of Care (Signed)
Plan of care reviewed and discussed with the patient. 

## 2020-06-30 NOTE — Anesthesia Preprocedure Evaluation (Addendum)
Anesthesia Evaluation  Patient identified by MRN, date of birth, ID band Patient awake    Reviewed: Allergy & Precautions, NPO status , Patient's Chart, lab work & pertinent test results  History of Anesthesia Complications Negative for: history of anesthetic complications  Airway Mallampati: I  TM Distance: >3 FB Neck ROM: Full    Dental  (+) Teeth Intact, Dental Advisory Given   Pulmonary neg pulmonary ROS,    Pulmonary exam normal breath sounds clear to auscultation       Cardiovascular negative cardio ROS Normal cardiovascular exam Rhythm:Regular Rate:Normal     Neuro/Psych PSYCHIATRIC DISORDERS Depression negative neurological ROS     GI/Hepatic negative GI ROS, Neg liver ROS,   Endo/Other  Hypothyroidism   Renal/GU negative Renal ROS     Musculoskeletal  (+) Arthritis , Osteoarthritis,  OA left hip    Abdominal   Peds  Hematology negative hematology ROS (+) Plt 357k    Anesthesia Other Findings Day of surgery medications reviewed with the patient.  Reproductive/Obstetrics                            Anesthesia Physical Anesthesia Plan  ASA: II  Anesthesia Plan: Spinal   Post-op Pain Management:    Induction:   PONV Risk Score and Plan: 2 and Propofol infusion, Ondansetron, Dexamethasone, Midazolam and Treatment may vary due to age or medical condition  Airway Management Planned: Natural Airway and Nasal Cannula  Additional Equipment:   Intra-op Plan:   Post-operative Plan:   Informed Consent: I have reviewed the patients History and Physical, chart, labs and discussed the procedure including the risks, benefits and alternatives for the proposed anesthesia with the patient or authorized representative who has indicated his/her understanding and acceptance.     Dental advisory given  Plan Discussed with: CRNA, Anesthesiologist and Surgeon  Anesthesia Plan  Comments:         Anesthesia Quick Evaluation

## 2020-06-30 NOTE — Discharge Instructions (Signed)

## 2020-06-30 NOTE — Interval H&P Note (Signed)
History and Physical Interval Note: The patient understands that she is here today for a left total hip arthroplasty to treat the pain from her left hip osteoarthritis.  There is been no acute change in her medical status.  See recent H&P.  The risk and benefits of surgery have been discussed in detail and informed consent is obtained.  The left hip has been marked.  06/30/2020 8:46 AM  Tonya Ingram  has presented today for surgery, with the diagnosis of Osteoarthritis Left Hip.  The various methods of treatment have been discussed with the patient and family. After consideration of risks, benefits and other options for treatment, the patient has consented to  Procedure(s): LEFT TOTAL HIP ARTHROPLASTY ANTERIOR APPROACH (Left) as a surgical intervention.  The patient's history has been reviewed, patient examined, no change in status, stable for surgery.  I have reviewed the patient's chart and labs.  Questions were answered to the patient's satisfaction.     Mcarthur Rossetti

## 2020-06-30 NOTE — Op Note (Signed)
Tonya Ingram, Tonya Ingram MEDICAL RECORD DG:[id number] ACCOUNT [id number] DATE OF BIRTH:08/31/1950 FACILITY: [facility] LOCATION: [facility] PHYSICIAN:Avelardo Reesman [place], MD  OPERATIVE REPORT  DATE OF PROCEDURE:  06/30/2020  PREOPERATIVE DIAGNOSIS:  Primary osteoarthritis and degenerative joint disease, left hip.  POSTOPERATIVE DIAGNOSIS:  Primary osteoarthritis and degenerative joint disease, left hip.  PROCEDURE:  Left total hip arthroplasty through direct anterior approach.  IMPLANTS:  DePuy Sector Gription acetabular component size 50, size 32+0 neutral polyethylene liner, size 10 Corail femoral component with standard offset, size 32+1 metal hip ball.    SURGEON:  Lind Guest. Ninfa Linden, MD  ASSISTANT:  Erskine Emery, PA-C  ANESTHESIA:  Spinal.  ANTIBIOTICS:  Two grams IV Ancef.  ESTIMATED BLOOD LOSS:  100 mL.  COMPLICATIONS:  None.  INDICATIONS:  The patient is a 70 year old female with debilitating arthritis involving her left hip.  This has been well documented with x-rays and clinical exam.  At this point, her pain is daily and is detrimentally affecting her mobility, her quality  of life and her activities of daily living to the point she does wish to proceed with a total hip arthroplasty of her left hip and we have recommended this to her as well.  We talked in detail about the risk of acute blood loss anemia, nerve or vessel  injury, fracture, infection, dislocation, DVT and implant failure.  We talked about our goals being decreased pain, improve mobility and overall improve quality of life.  DESCRIPTION OF PROCEDURE:  After informed consent was obtained and appropriate left hip was marked.  She was brought to the operating room and sat up on a stretcher where spinal anesthesia was obtained.  She was laid in supine position on a stretcher.   Foley catheter was placed.  I was able to assess her leg length and she is slightly shorter on the left than the right.  Traction boots  were placed on both her feet.  Next, she was placed supine on the Hana fracture table, the perineal post in place and  both legs in line skeletal traction device and no traction applied.  Her left operative hip was prepped and draped with DuraPrep and sterile drapes.  A time-out was called and she was identified as correct patient and correct left hip.  I then made an  incision just inferior and posterior to the anterior superior iliac spine and carried this obliquely down the leg.  We dissected down tensor fascia lata muscle.  Tensor fascia was then divided longitudinally to proceed with direct anterior approach to  the hip.  We identified and cauterized circumflex vessels and identified the hip capsule in an L-type format finding a moderate joint effusion and significant periarticular osteophytes around the femoral head and neck.  We then opened the joint capsule  in an L-type format again finding a moderate joint effusion.  We placed curved retractors around the medial and lateral femoral neck and made our femoral neck cut with an oscillating saw just proximal to the lesser trochanter and completed this with  osteotome.  We placed a corkscrew guide in the femoral head and found a wide area devoid of cartilage and removed the femoral head.  I then placed a bent Hohmann over the medial acetabular rim and removed remnants of acetabular labrum and other debris.   We then began reaming under direct visualization from a size 44 reamer in stepwise increments up to a size 49 with all reamers under direct visualization, the last reamer was also placed under direct  fluoroscopy, so we could obtain our depth of reaming,  our inclination and anteversion.  I then placed the real DePuy Sector Gription acetabular component size 50 and a 32+0 neutral polyethylene liner for that size acetabular component.  Attention was then turned to the femur.  With the leg externally  rotated to 120 degrees extended and adducted, I was  able to place a Mueller retractor medially and Hohman retractor behind the greater trochanter.  I released lateral joint capsule and used a box-cutting osteotome to enter the femoral canal and a rongeur  to lateralize the knee.  We then began broaching using the Corail broaching system from a size 8 just to a size 10.  This did fill the canal.  We trialed a standard offset femoral neck and a 32+1 hip ball, reduced this in the acetabulum.  We were  pleased with the leg length, offset, range of motion and stability assessed radiographically and mechanically.  We then dislocated the hip and removed the trial components.  We placed the real Corail femoral component with standard offset size 10 and the  real 32+1 metal hip ball and again reduced this in the acetabulum and it was stable.  We then irrigated the soft tissue with normal saline solution using pulsatile lavage.  We closed the joint capsule with interrupted #1 Ethibond suture, followed by  closing the tensor fascia with #1 Vicryl.  0 Vicryl was used to close deep tissue and 2-0 Vicryl was used to close the subcutaneous tissue.  The skin was reapproximated with a subcuticular Monocryl suture with Steri-Strips applied.  Aquacel dressing was  then placed.  She was taken off the Hana table and taken to recovery room in stable condition with all final counts being correct and no complications noted.  Of note, Tonya Stabile, PA-C, assisted during the entire case and assistance was crucial for  facilitating all aspects of this case.  HN/NUANCE  D:06/30/2020 T:06/30/2020 JOB:[id number]

## 2020-06-30 NOTE — Anesthesia Procedure Notes (Signed)
Spinal  Patient location during procedure: OR Start time: 06/30/2020 10:04 AM End time: 06/30/2020 10:07 AM Staffing Performed: anesthesiologist  Anesthesiologist: Catalina Gravel, MD Preanesthetic Checklist Completed: patient identified, IV checked, risks and benefits discussed, surgical consent, monitors and equipment checked, pre-op evaluation and timeout performed Spinal Block Patient position: sitting Prep: DuraPrep and site prepped and draped Patient monitoring: continuous pulse ox and blood pressure Approach: midline Location: L3-4 Injection technique: single-shot Needle Needle type: Pencan  Needle gauge: 24 G Additional Notes Functioning IV was confirmed and monitors were applied. Sterile prep and drape, including hand hygiene, mask and sterile gloves were used. The patient was positioned and the spine was prepped. The skin was anesthetized with lidocaine.  Free flow of clear CSF was obtained prior to injecting local anesthetic into the CSF.  The spinal needle aspirated freely following injection.  The needle was carefully withdrawn.  The patient tolerated the procedure well. Consent was obtained prior to procedure with all questions answered and concerns addressed. Risks including but not limited to bleeding, infection, nerve damage, paralysis, failed block, inadequate analgesia, allergic reaction, high spinal, itching and headache were discussed and the patient wished to proceed.   Hoy Morn, MD

## 2020-06-30 NOTE — Evaluation (Signed)
Physical Therapy Evaluation Patient Details Name: Tonya Ingram MRN: [id number] DOB: August 23, 1950 Today's Date: 06/30/2020   History of Present Illness  Patient is 70 y.o. female s/p Lt THA anterior approach on 06/30/20 with PMH significant for OA, hypothyroidism, GERD, Raynaud's, depression.    Clinical Impression  Cara Thaxton is a 70 y.o. female POD 0 s/p Lt THA. Patient reports independence with mobility at baseline. Patient is now limited by functional impairments (see PT problem list below) and requires min assist for transfers and gait with RW. Patient was able to ambulate ~60 feet with RW and min assist. Patient instructed in exercise to facilitate ROM and circulation. Patient will benefit from continued skilled PT interventions to address impairments and progress towards PLOF. Acute PT will follow to progress mobility and stair training in preparation for safe discharge home.     Follow Up Recommendations Follow surgeon's recommendation for DC plan and follow-up therapies;Home health PT    Equipment Recommendations  None recommended by PT    Recommendations for Other Services       Precautions / Restrictions Precautions Precautions: Fall Restrictions Weight Bearing Restrictions: No      Mobility  Bed Mobility Overal bed mobility: Needs Assistance Bed Mobility: Supine to Sit     Supine to sit: Min assist;HOB elevated     General bed mobility comments: assist for Lt LE to move to EOB, cues for pt to use bed rail to pivot to EOB.    Transfers Overall transfer level: Needs assistance Equipment used: Rolling walker (2 wheeled) Transfers: Sit to/from Stand Sit to Stand: Min guard         General transfer comment: cues for technique with RW, guarding for safety with rise to RW.  Ambulation/Gait Ambulation/Gait assistance: Min assist;Min guard Gait Distance (Feet): 60 Feet Assistive device: Rolling walker (2 wheeled) Gait Pattern/deviations: Step-to  pattern;Decreased stride length;Decreased weight shift to left Gait velocity: decr   General Gait Details: cues for step pattern and walker position, min assist to sequence and pt progressed to min guard for safety.  Stairs            Wheelchair Mobility    Modified Rankin (Stroke Patients Only)       Balance Overall balance assessment: Needs assistance Sitting-balance support: Feet supported Sitting balance-Leahy Scale: Good     Standing balance support: During functional activity;Bilateral upper extremity supported Standing balance-Leahy Scale: Fair                               Pertinent Vitals/Pain Pain Assessment: 0-10 Pain Score: 5  Pain Location: Lt hip Pain Descriptors / Indicators: Aching Pain Intervention(s): Limited activity within patient's tolerance;Monitored during session;Repositioned;Ice applied;Premedicated before session    [place] expects to be discharged to:: Private residence Living Arrangements: Alone Available Help at Discharge: Family;Friend(s) Type of Home: Apartment Home Access: Stairs to enter Entrance Stairs-Rails: Can reach both Entrance Stairs-Number of Steps: 10-14 Home Layout: One level Home Equipment: [place] - 2 wheels;Cane - single point Additional Comments: pt plans to stay with her sister through the weekend: single level house, 1 step to enter.    Prior Function Level of Independence: Independent         Comments: pt is very active and does bodypump, cycling, swimming, walking.     Hand Dominance   Dominant Hand: Right    Extremity/Trunk Assessment   Upper Extremity Assessment Upper Extremity Assessment: Overall WFL for  tasks assessed    Lower Extremity Assessment Lower Extremity Assessment: Overall WFL for tasks assessed LLE Sensation: WNL LLE Coordination: WNL    Cervical / Trunk Assessment Cervical / Trunk Assessment: Normal  Communication   Communication: No  difficulties  Cognition Arousal/Alertness: Awake/alert Behavior During Therapy: WFL for tasks assessed/performed Overall Cognitive Status: Within Functional Limits for tasks assessed                                        General Comments      Exercises Total Joint Exercises Ankle Circles/Pumps: AROM;Both;20 reps;Seated Quad Sets: Left;5 reps;Seated Heel Slides: AROM;5 reps;Left;Seated   Assessment/Plan    PT Assessment Patient needs continued PT services  PT Problem List Decreased strength;Decreased range of motion;Decreased activity tolerance;Decreased balance;Decreased mobility;Decreased knowledge of use of DME;Decreased knowledge of precautions       PT Treatment Interventions DME instruction;Gait training;Stair training;Functional mobility training;Therapeutic activities;Therapeutic exercise;Balance training;Patient/family education    PT Goals (Current goals can be found in the Care Plan section)  Acute Rehab PT Goals Patient Stated Goal: get back to exercising and independence PT Goal Formulation: With patient Time For Goal Achievement: 07/07/20 Potential to Achieve Goals: Good    Frequency 7X/week   Barriers to discharge        Co-evaluation               AM-PAC PT "6 Clicks" Mobility  Outcome Measure Help needed turning from your back to your side while in a flat bed without using bedrails?: A Little Help needed moving from lying on your back to sitting on the side of a flat bed without using bedrails?: A Little Help needed moving to and from a bed to a chair (including a wheelchair)?: A Little Help needed standing up from a chair using your arms (e.g., wheelchair or bedside chair)?: A Little Help needed to walk in hospital room?: A Little Help needed climbing 3-5 steps with a railing? : A Little 6 Click Score: 18    End of Session Equipment Utilized During Treatment: Gait belt Activity Tolerance: Patient tolerated treatment  well Patient left: in chair;with call bell/phone within reach;with chair alarm set Nurse Communication: Mobility status PT Visit Diagnosis: Muscle weakness (generalized) (M62.81);Difficulty in walking, not elsewhere classified (R26.2)    Time: 1610-9604 PT Time Calculation (min) (ACUTE ONLY): 17 min   Charges:   PT Evaluation $PT Eval Low Complexity: 1 Low          Verner Mould, DPT Acute Rehabilitation Services Office [phone] Pager [phone]    Jacques Navy 06/30/2020, 5:50 PM

## 2020-06-30 NOTE — Anesthesia Postprocedure Evaluation (Signed)
Anesthesia Post Note  Patient: Tonya Ingram  Procedure(s) Performed: LEFT TOTAL HIP ARTHROPLASTY ANTERIOR APPROACH (Left Hip)     Patient location during evaluation: PACU Anesthesia Type: Spinal Level of consciousness: oriented, awake and alert and awake Pain management: pain level controlled Vital Signs Assessment: post-procedure vital signs reviewed and stable Respiratory status: spontaneous breathing, respiratory function stable, patient connected to nasal cannula oxygen and nonlabored ventilation Cardiovascular status: blood pressure returned to baseline and stable Postop Assessment: no headache, no backache, no apparent nausea or vomiting and spinal receding Anesthetic complications: no   No complications documented.  Last Vitals:  Vitals:   06/30/20 1315 06/30/20 1350  BP: (!) 146/71 123/65  Pulse:    Resp: 10 16  Temp: 36.4 C   SpO2: 100% 96%    Last Pain:  Vitals:   06/30/20 1350  TempSrc:   PainSc: 0-No pain                 Catalina Gravel

## 2020-06-30 NOTE — Transfer of Care (Signed)
Immediate Anesthesia Transfer of Care Note  Patient: Tonya Ingram  Procedure(s) Performed: LEFT TOTAL HIP ARTHROPLASTY ANTERIOR APPROACH (Left Hip)  Patient Location: PACU  Anesthesia Type:Spinal  Level of Consciousness: awake and alert   Airway & Oxygen Therapy: Patient Spontanous Breathing and Patient connected to face mask oxygen  Post-op Assessment: Report given to RN and Post -op Vital signs reviewed and stable  Post vital signs: Reviewed and stable  Last Vitals:  Vitals Value Taken Time  BP    Temp    Pulse    Resp 15 06/30/20 1140  SpO2    Vitals shown include unvalidated device data.  Last Pain:  Vitals:   06/30/20 0749  TempSrc: Oral  PainSc: 4       Patients Stated Pain Goal: 2 (96/28/36 6294)  Complications: No complications documented.

## 2020-06-30 NOTE — Anesthesia Procedure Notes (Signed)
Date/Time: 06/30/2020 10:05 AM Performed by: Cynda Familia, CRNA Pre-anesthesia Checklist: Patient identified, Emergency Drugs available, Suction available, Patient being monitored and Timeout performed Patient Re-evaluated:Patient Re-evaluated prior to induction Oxygen Delivery Method: Simple face mask Placement Confirmation: breath sounds checked- equal and bilateral and positive ETCO2 Dental Injury: Teeth and Oropharynx as per pre-operative assessment

## 2020-06-30 NOTE — Brief Op Note (Signed)
06/30/2020  11:12 AM  PATIENT:  Tonya Ingram  70 y.o. female  PRE-OPERATIVE DIAGNOSIS:  Osteoarthritis Left Hip  POST-OPERATIVE DIAGNOSIS:  Osteoarthritis Left Hip  PROCEDURE:  Procedure(s): LEFT TOTAL HIP ARTHROPLASTY ANTERIOR APPROACH (Left)  SURGEON:  Surgeon(s) and Role:    Mcarthur Rossetti, MD - Primary  PHYSICIAN ASSISTANT:  Benita Stabile, PA-C  ANESTHESIA:   spinal  EBL:  100 mL   COUNTS:  YES  DICTATION: .Other Dictation: Dictation Number [id number]  PLAN OF CARE: Admit for overnight observation  PATIENT DISPOSITION:  PACU - hemodynamically stable.   Delay start of Pharmacological VTE agent (>24hrs) due to surgical blood loss or risk of bleeding: no

## 2020-07-01 DIAGNOSIS — R531 Weakness: Secondary | ICD-10-CM | POA: Diagnosis not present

## 2020-07-01 DIAGNOSIS — M1612 Unilateral primary osteoarthritis, left hip: Secondary | ICD-10-CM | POA: Diagnosis not present

## 2020-07-01 LAB — BASIC METABOLIC PANEL
Anion gap: 7 (ref 5–15)
BUN: 10 mg/dL (ref 8–23)
CO2: 25 mmol/L (ref 22–32)
Calcium: 8.8 mg/dL — ABNORMAL LOW (ref 8.9–10.3)
Chloride: 104 mmol/L (ref 98–111)
Creatinine, Ser: 0.72 mg/dL (ref 0.44–1.00)
GFR, Estimated: >60 mL/min (ref >60)
Glucose, Bld: 152 mg/dL — ABNORMAL HIGH (ref 70–99)
Potassium: 4 mmol/L (ref 3.5–5.1)
Sodium: 136 mmol/L (ref 135–145)

## 2020-07-01 LAB — CBC
HCT: 33 % — ABNORMAL LOW (ref 36.0–46.0)
Hemoglobin: 10.6 g/dL — ABNORMAL LOW (ref 12.0–15.0)
MCH: 31 pg (ref 26.0–34.0)
MCHC: 32.1 g/dL (ref 30.0–36.0)
MCV: 96.5 fL (ref 80.0–100.0)
Platelets: 257 10*3/uL (ref 150–400)
RBC: 3.42 MIL/uL — ABNORMAL LOW (ref 3.87–5.11)
RDW: 12.5 % (ref 11.5–15.5)
WBC: 8.6 10*3/uL (ref 4.0–10.5)
nRBC: 0 % (ref 0.0–0.2)

## 2020-07-01 MED ORDER — METHOCARBAMOL 500 MG PO TABS: 500.0000 mg | ORAL_TABLET | Freq: Three times a day (TID) | ORAL | 0 refills | Status: DC | PRN

## 2020-07-01 MED ORDER — OXYCODONE HCL 5 MG PO TABS: 5.0000 mg | ORAL_TABLET | Freq: Four times a day (QID) | ORAL | 0 refills | Status: DC | PRN

## 2020-07-01 MED ORDER — ASPIRIN 81 MG PO CHEW: 81.0000 mg | CHEWABLE_TABLET | Freq: Two times a day (BID) | ORAL | 0 refills | Status: DC

## 2020-07-01 NOTE — Progress Notes (Signed)
Physical Therapy Treatment Patient Details Name: Tonya Ingram MRN: [id number] DOB: August 17, 1950 Today's Date: 07/01/2020    History of Present Illness Patient is 70 y.o. female s/p Lt THA anterior approach on 06/30/20 with PMH significant for OA, hypothyroidism, GERD, Raynaud's, depression.    PT Comments    Pt is POD # 1 and is progressing well.  Pt demonstrates safe gait & transfers in order to return home from PT perspective once discharged by MD.  Pt with good pain control, motivation, support at discharge, and understanding of safety and rehab process.  She ambulated 200' and performed stairs safely.  While in hospital, will continue to benefit from PT for skilled therapy to advance mobility and exercises.      Follow Up Recommendations  Follow surgeon's recommendation for DC plan and follow-up therapies     Equipment Recommendations  None recommended by PT    Recommendations for Other Services       Precautions / Restrictions Precautions Precautions: Fall    Mobility  Bed Mobility Overal bed mobility: Needs Assistance Bed Mobility: Supine to Sit     Supine to sit: Supervision     General bed mobility comments: cues for use of gait belt to move L LE  Transfers Overall transfer level: Needs assistance Equipment used: Rolling walker (2 wheeled) Transfers: Sit to/from Stand Sit to Stand: Supervision         General transfer comment: Performed x 3 with safe technique; also performed toileting ADLs in standing with safe technique  Ambulation/Gait Ambulation/Gait assistance: Supervision;Min guard Gait Distance (Feet): 200 Feet Assistive device: Rolling walker (2 wheeled) Gait Pattern/deviations: Step-to pattern;Decreased stride length;Decreased weight shift to left Gait velocity: decr   General Gait Details: Min guard progressed to supervision; Cues for RW proximity initially with good carry through; step to left gait initially progressed to partial reciprocal  pattern with cues   Stairs Stairs: Yes Stairs assistance: Min guard   Number of Stairs: 2 (2 steps x 2) General stair comments: Performed stairs in multiple ways.  Started with 1 step bil rails and progressed to next step with 1 rail and HHA. On next bout performed 1 step with HHA then step up to platform step with RW.  Cues for sequence and education on how to perform stairs with and without rails.  At her apartment she has flight of steps with bil rails, but she is going to stay at friends house who has 1 step no rail and then large step up into house.   Wheelchair Mobility    Modified Rankin (Stroke Patients Only)       Balance Overall balance assessment: Needs assistance Sitting-balance support: Feet supported Sitting balance-Leahy Scale: Normal     Standing balance support: During functional activity;Bilateral upper extremity supported;No upper extremity supported Standing balance-Leahy Scale: Good Standing balance comment: RW for ambulation but able to do toielting ADLs/pulling up pants in standing without UE support                            Cognition Arousal/Alertness: Awake/alert Behavior During Therapy: WFL for tasks assessed/performed Overall Cognitive Status: Within Functional Limits for tasks assessed                                        Exercises Total Joint Exercises Ankle Circles/Pumps: AROM;Both;20 reps;Supine Quad Sets: AROM;Both;10 reps;Supine Heel  Slides: AAROM;Left;10 reps;Supine (educated on use of gait belt and to tolerance) Hip ABduction/ADduction: AAROM;Left;10 reps;Supine (educated on use of gait belt and to tolerance) Long Arc Quad: AROM;Left;10 reps;Seated    General Comments General comments (skin integrity, edema, etc.): Pt educated on safe ice use, no pivots, HEP, car transfer technique, and ADL techniques      Pertinent Vitals/Pain Pain Assessment: 0-10 Pain Score: 3  Pain Location: Lt hip Pain  Descriptors / Indicators: Aching Pain Intervention(s): Limited activity within patient's tolerance;Monitored during session;Premedicated before session;Ice applied    Home Living                      Prior Function            PT Goals (current goals can now be found in the care plan section) Acute Rehab PT Goals Patient Stated Goal: get back to exercising and independence (pt likes to run, bike, and swim) PT Goal Formulation: With patient Time For Goal Achievement: 07/07/20 Potential to Achieve Goals: Good Progress towards PT goals: Progressing toward goals    Frequency    7X/week      PT Plan Current plan remains appropriate    Co-evaluation              AM-PAC PT "6 Clicks" Mobility   Outcome Measure  Help needed turning from your back to your side while in a flat bed without using bedrails?: A Little Help needed moving from lying on your back to sitting on the side of a flat bed without using bedrails?: A Little Help needed moving to and from a bed to a chair (including a wheelchair)?: A Little Help needed standing up from a chair using your arms (e.g., wheelchair or bedside chair)?: A Little Help needed to walk in hospital room?: A Little Help needed climbing 3-5 steps with a railing? : A Little 6 Click Score: 18    End of Session Equipment Utilized During Treatment: Gait belt Activity Tolerance: Patient tolerated treatment well Patient left: in chair;with call bell/phone within reach;with family/visitor present Nurse Communication: Mobility status PT Visit Diagnosis: Muscle weakness (generalized) (M62.81);Difficulty in walking, not elsewhere classified (R26.2)     Time: 1125-1201 PT Time Calculation (min) (ACUTE ONLY): 36 min  Charges:  $Gait Training: 8-22 mins $Therapeutic Exercise: 8-22 mins                     Abran Richard, PT Acute Rehab Services Pager [phone] [facility] [place] 07/01/2020, 12:16  PM

## 2020-07-01 NOTE — TOC Progression Note (Signed)
Transition of Care [facility]) - Progression Note    Patient Details  Name: Khyleigh Furney MRN: [id number] Date of Birth: [birth year]  [facility] CM/SW Contact  Joaquin Courts, RN Phone Number: 07/01/2020, 11:02 AM  Clinical Narrative:    CM spoke with patient who reports she has rolling walker at home, requests an elevated seat for toilet.  3in1 was ordered from [employer].  [facility] to provide HHPT services.   Expected Discharge Plan: [place] Barriers to Discharge: No Barriers Identified  Expected Discharge Plan and Services Expected Discharge Plan: [place]   Discharge Planning Services: CM Consult Post Acute Care Choice: [place] arrangements for the past 2 months: Single Family Home Expected Discharge Date: 07/01/20               DME Arranged: 3-N-1 DME Agency: [employer] Date DME Agency Contacted: 07/01/20 Time DME Agency Contacted: 32 Representative spoke with at DME Agency: [place]: PT [place]: [facility] [employer]     Representative spoke with at [place]: pre-arranged in MD office   Social Determinants of Health ([place]) Interventions    Readmission Risk Interventions No flowsheet data found.

## 2020-07-01 NOTE — Progress Notes (Signed)
  Subjective: Patient stable.  Moving well.  Pain is controlled.  Walking to bathroom easily.   Objective: Vital signs in last 24 hours: Temp:  [96.1 F (35.6 C)-98 F (36.7 C)] 97.8 F (36.6 C) (01/08 0628) Pulse Rate:  [48-64] 58 (01/08 0628) Resp:  [10-18] 17 (01/08 0628) BP: (101-146)/(59-76) 123/70 (01/08 0628) SpO2:  [95 %-100 %] 98 % (01/08 0628)  Intake/Output from previous day: 01/07 0701 - 01/08 0700 In: 3070 [P.O.:720; I.V.:2100; IV Piggyback:250] Out: 3250 [Urine:3150; Blood:100] Intake/Output this shift: Total I/O In: -  Out: 400 [Urine:400]  Exam:  Sensation intact distally Intact pulses distally Dorsiflexion/Plantar flexion intact  Labs: Recent Labs    07/01/20 0336  HGB 10.6*   Recent Labs    07/01/20 0336  WBC 8.6  RBC 3.42*  HCT 33.0*  PLT 257   Recent Labs    07/01/20 0336  NA 136  K 4.0  CL 104  CO2 25  BUN 10  CREATININE 0.72  GLUCOSE 152*  CALCIUM 8.8*   No results for input(s): LABPT, INR in the last 72 hours.  Assessment/Plan: This time is to discharge after physical therapy this morning.  Patient is highly motivated to work with therapy and to discharge this afternoon.   Tonya Ingram 07/01/2020, 8:59 AM

## 2020-07-01 NOTE — Plan of Care (Signed)
  Problem: Education: Goal: Knowledge of General Education information will improve Description: Including pain rating scale, medication(s)/side effects and non-pharmacologic comfort measures Outcome: Adequate for Discharge   Problem: Health Behavior/Discharge Planning: Goal: Ability to manage health-related needs will improve Outcome: Adequate for Discharge   Problem: Clinical Measurements: Goal: Ability to maintain clinical measurements within normal limits will improve Outcome: Adequate for Discharge Goal: Will remain free from infection Outcome: Adequate for Discharge Goal: Diagnostic test results will improve Outcome: Adequate for Discharge Goal: Respiratory complications will improve Outcome: Adequate for Discharge Goal: Cardiovascular complication will be avoided Outcome: Adequate for Discharge   Problem: Activity: Goal: Risk for activity intolerance will decrease Outcome: Adequate for Discharge   Problem: Elimination: Goal: Will not experience complications related to bowel motility Outcome: Adequate for Discharge   Problem: Pain Managment: Goal: General experience of comfort will improve Outcome: Adequate for Discharge   Problem: Safety: Goal: Ability to remain free from injury will improve Outcome: Adequate for Discharge   Problem: Acute Rehab PT Goals(only PT should resolve) Goal: Pt Will Go Supine/Side To Sit Outcome: Adequate for Discharge Goal: Patient Will Transfer Sit To/From Stand Outcome: Adequate for Discharge Goal: Pt Will Ambulate Outcome: Adequate for Discharge Goal: Pt Will Go Up/Down Stairs Outcome: Adequate for Discharge   Problem: Education: Goal: Knowledge of the prescribed therapeutic regimen will improve Outcome: Adequate for Discharge Goal: Understanding of discharge needs will improve Outcome: Adequate for Discharge   Problem: Activity: Goal: Ability to avoid complications of mobility impairment will improve Outcome: Adequate for  Discharge Goal: Ability to tolerate increased activity will improve Outcome: Adequate for Discharge   Problem: Clinical Measurements: Goal: Postoperative complications will be avoided or minimized Outcome: Adequate for Discharge   Problem: Pain Management: Goal: Pain level will decrease with appropriate interventions Outcome: Adequate for Discharge   Problem: Skin Integrity: Goal: Will show signs of wound healing Outcome: Adequate for Discharge

## 2020-07-02 ENCOUNTER — Other Ambulatory Visit: Payer: Self-pay | Admitting: Family Medicine

## 2020-07-02 DIAGNOSIS — E039 Hypothyroidism, unspecified: Secondary | ICD-10-CM | POA: Diagnosis not present

## 2020-07-02 DIAGNOSIS — Z471 Aftercare following joint replacement surgery: Secondary | ICD-10-CM | POA: Diagnosis not present

## 2020-07-02 DIAGNOSIS — K219 Gastro-esophageal reflux disease without esophagitis: Secondary | ICD-10-CM | POA: Diagnosis not present

## 2020-07-02 DIAGNOSIS — I7300 Raynaud's syndrome without gangrene: Secondary | ICD-10-CM | POA: Diagnosis not present

## 2020-07-02 DIAGNOSIS — Z7982 Long term (current) use of aspirin: Secondary | ICD-10-CM | POA: Diagnosis not present

## 2020-07-02 DIAGNOSIS — Z96642 Presence of left artificial hip joint: Secondary | ICD-10-CM | POA: Diagnosis not present

## 2020-07-02 DIAGNOSIS — Z9181 History of falling: Secondary | ICD-10-CM | POA: Diagnosis not present

## 2020-07-02 DIAGNOSIS — F32A Depression, unspecified: Secondary | ICD-10-CM | POA: Diagnosis not present

## 2020-07-03 ENCOUNTER — Encounter (HOSPITAL_COMMUNITY): Payer: Self-pay | Admitting: Orthopaedic Surgery

## 2020-07-06 ENCOUNTER — Inpatient Hospital Stay: Payer: PPO | Admitting: Orthopaedic Surgery

## 2020-07-13 ENCOUNTER — Encounter: Payer: Self-pay | Admitting: Orthopaedic Surgery

## 2020-07-13 ENCOUNTER — Ambulatory Visit (INDEPENDENT_AMBULATORY_CARE_PROVIDER_SITE_OTHER): Payer: PPO | Admitting: Orthopaedic Surgery

## 2020-07-13 DIAGNOSIS — Z96642 Presence of left artificial hip joint: Secondary | ICD-10-CM

## 2020-07-13 NOTE — Progress Notes (Signed)
The patient is 2 weeks tomorrow status post a left total hip arthroplasty.  She is very active and doing great overall.  She is a very young 70 years old.  She is only taking Tylenol for pain.  She has been on baby aspirin twice a day.  She denies any foot and ankle swelling and has been wearing compressive garments.  She is walking without any assistive device.  Her left hip incision looks good.  I removed the old Steri-Strips and placed new ones.  There is a mild hematoma but no significant seroma.  She will continue to increase her activities as comfort allows.  I like to see her back in 4 weeks to see how she is doing overall but no x-rays are needed.  She can resume her regular work starting September 7.

## 2020-07-25 NOTE — Discharge Summary (Signed)
Patient ID: Lesli Issa MRN: [id number] DOB/AGE: Jan 15, 1951 70 y.o.  Admit date: 06/30/2020 Discharge date: 07/25/2020  Admission Diagnoses:  Principal Problem:   Unilateral primary osteoarthritis, left hip Active Problems:   Status post total replacement of left hip   Discharge Diagnoses:  Same  Past Medical History:  Diagnosis Date  . Arthritis   . Depression   . GERD (gastroesophageal reflux disease)   . Hypothyroidism   . Raynaud's disease   . Thyroid disease     Surgeries: Procedure(s): LEFT TOTAL HIP ARTHROPLASTY ANTERIOR APPROACH on 06/30/2020   Consultants:   Discharged Condition: Improved  Hospital Course: Tamari Redwine is an 71 y.o. female who was admitted 06/30/2020 for operative treatment ofUnilateral primary osteoarthritis, left hip. Patient has severe unremitting pain that affects sleep, daily activities, and work/hobbies. After pre-op clearance the patient was taken to the operating room on 06/30/2020 and underwent  Procedure(s): LEFT TOTAL HIP ARTHROPLASTY ANTERIOR APPROACH.    Patient was given perioperative antibiotics:  Anti-infectives (From admission, onward)   Start     Dose/Rate Route Frequency Ordered Stop   06/30/20 1600  ceFAZolin (ANCEF) IVPB 1 g/50 mL premix        1 g 100 mL/hr over 30 Minutes Intravenous Every 6 hours 06/30/20 1154 06/30/20 2242   06/30/20 0745  ceFAZolin (ANCEF) IVPB 2g/100 mL premix        2 g 200 mL/hr over 30 Minutes Intravenous On call to O.R. 06/30/20 0742 06/30/20 1009       Patient was given sequential compression devices, early ambulation, and chemoprophylaxis to prevent DVT.  Patient benefited maximally from hospital stay and there were no complications.    Recent vital signs: No data found.   Recent laboratory studies: No results for input(s): WBC, HGB, HCT, PLT, NA, K, CL, CO2, BUN, CREATININE, GLUCOSE, INR, CALCIUM in the last 72 hours.  Invalid input(s): PT, 2   Discharge Medications:   Allergies as  of 07/01/2020      Reactions   Codeine Nausea And Vomiting      Medication List    TAKE these medications   aspirin 81 MG chewable tablet Chew 1 tablet (81 mg total) by mouth 2 (two) times daily.   Calcium Carbonate-Vitamin D 600-400 MG-UNIT tablet Take 1 tablet by mouth daily.   escitalopram 10 MG tablet Commonly known as: LEXAPRO Take 10 mg by mouth daily.   furosemide 20 MG tablet Commonly known as: LASIX Take 10 mg by mouth daily as needed for edema.   levothyroxine 50 MCG tablet Commonly known as: SYNTHROID Take 50 mcg by mouth daily before breakfast.   magnesium oxide 400 MG tablet Commonly known as: MAG-OX Take 400 mg by mouth daily.   meloxicam 15 MG tablet Commonly known as: MOBIC Take one pill a day with food for 7 days and then prn thereafter What changed:   how much to take  how to take this  when to take this  additional instructions   methocarbamol 500 MG tablet Commonly known as: ROBAXIN Take 1 tablet (500 mg total) by mouth every 8 (eight) hours as needed for muscle spasms.   NONFORMULARY OR COMPOUNDED ITEM Apply 1 application topically daily. Hormone Cream : estrogen and progesterone mixed   oxyCODONE 5 MG immediate release tablet Commonly known as: Oxy IR/ROXICODONE Take 1-2 tablets (5-10 mg total) by mouth every 6 (six) hours as needed for moderate pain (pain score 4-6).       Diagnostic Studies: DG Pelvis Portable  Result Date: 06/30/2020 CLINICAL DATA:  Status post left hip arthroplasty. EXAM: PORTABLE PELVIS 1-2 VIEWS COMPARISON:  Intraoperative imaging of earlier today. FINDINGS: Left hip arthroplasty. No acute hardware complication. Normal right hip. No periprosthetic fracture. Expected subcutaneous and intramuscular gas about the surgical site. IMPRESSION: Expected appearance after left hip arthroplasty. Electronically Signed   By: Abigail Miyamoto M.D.   On: 06/30/2020 12:23   DG C-Arm 1-60 Min-No Report  Result Date:  06/30/2020 CLINICAL DATA:  Hip replacement surgery, history of OA EXAM: OPERATIVE left HIP (WITH PELVIS IF PERFORMED) 2 VIEWS TECHNIQUE: Fluoroscopic spot image(s) were submitted for interpretation post-operatively. COMPARISON:  12/01/2019 FINDINGS: interval left hip arthroplasty. Femoral and acetabular components project in expected location. No fracture or dislocation. IMPRESSION: Interval left hip arthroplasty without apparent complication. Electronically Signed   By: Lucrezia Europe M.D.   On: 06/30/2020 11:18   DG HIP OPERATIVE UNILAT W OR W/O PELVIS LEFT  Result Date: 06/30/2020 CLINICAL DATA:  Hip replacement surgery, history of OA EXAM: OPERATIVE left HIP (WITH PELVIS IF PERFORMED) 2 VIEWS TECHNIQUE: Fluoroscopic spot image(s) were submitted for interpretation post-operatively. COMPARISON:  12/01/2019 FINDINGS: interval left hip arthroplasty. Femoral and acetabular components project in expected location. No fracture or dislocation. IMPRESSION: Interval left hip arthroplasty without apparent complication. Electronically Signed   By: Lucrezia Europe M.D.   On: 06/30/2020 11:18    Disposition: Discharge disposition: 01-Home or Self Care       Discharge Instructions    Call MD / Call 911   Complete by: As directed    If you experience chest pain or shortness of breath, CALL 911 and be transported to the hospital emergency room.  If you develope a fever above 101 F, pus (white drainage) or increased drainage or redness at the wound, or calf pain, call your surgeon's office.   Constipation Prevention   Complete by: As directed    Drink plenty of fluids.  Prune juice may be helpful.  You may use a stool softener, such as Colace (over the counter) 100 mg twice a day.  Use MiraLax (over the counter) for constipation as needed.   Diet - low sodium heart healthy   Complete by: As directed    Increase activity slowly as tolerated   Complete by: As directed        Follow-up Information    Mcarthur Rossetti, MD Follow up in 2 week(s).   Specialty: Orthopedic Surgery Contact information: [place] [address] [phone]        [facility] Follow up.   Specialty: [facility]: agency will provide home health physical therapy Contact information: [address] [place] [address] [phone]                Signed: Erskine Emery 07/25/2020, 12:15 PM

## 2020-07-27 ENCOUNTER — Other Ambulatory Visit: Payer: Self-pay | Admitting: Surgical

## 2020-07-27 NOTE — Telephone Encounter (Signed)
Pls advise.  

## 2020-08-01 NOTE — Telephone Encounter (Signed)
[facility] patient 

## 2020-08-08 ENCOUNTER — Other Ambulatory Visit: Payer: Self-pay

## 2020-08-08 DIAGNOSIS — M5416 Radiculopathy, lumbar region: Secondary | ICD-10-CM

## 2020-08-08 NOTE — Progress Notes (Signed)
Pt asking for repeat ESI order.  Order faxed to [facility] at [place].

## 2020-08-09 ENCOUNTER — Other Ambulatory Visit: Payer: Self-pay | Admitting: Family Medicine

## 2020-08-09 DIAGNOSIS — M5416 Radiculopathy, lumbar region: Secondary | ICD-10-CM

## 2020-08-10 ENCOUNTER — Encounter: Payer: Self-pay | Admitting: Orthopaedic Surgery

## 2020-08-10 ENCOUNTER — Ambulatory Visit (INDEPENDENT_AMBULATORY_CARE_PROVIDER_SITE_OTHER): Payer: PPO | Admitting: Orthopaedic Surgery

## 2020-08-10 DIAGNOSIS — Z96642 Presence of left artificial hip joint: Secondary | ICD-10-CM

## 2020-08-10 NOTE — Progress Notes (Signed)
The patient is around 6 weeks status post a left total hip arthroplasty.  She is a very active 70 year old female.  She is having some IT band pain around the knee.  She has been told by sports medicine that her ultrasound did show a lateral meniscal tear.  She is not having as much as she is at the joint line as she is not along the IT band.  Examination of her left knee shows no effusion and there is no significant lateral joint line tenderness and there is no positive McMurray sign to the lateral compartment.  There is pain though at the distal IT band just proximal to the left knee joint line and just distal.  Her left operative hip moves smoothly and fluidly.  She does not walk with any significant limp.  There is still some slight weakness in her hip flexion.  She will continue to increase her activities as comfort allows.  From my standpoint I do not need to see her back for 6 months unless she is having issues.  At that visit I like a standing low AP pelvis and a lateral of her left operative hip.

## 2020-08-14 ENCOUNTER — Ambulatory Visit
Admission: RE | Admit: 2020-08-14 | Discharge: 2020-08-14 | Disposition: A | Discharge status: Home / Self Care | Payer: PPO | Source: Ambulatory Visit | Attending: Family Medicine | Admitting: Family Medicine

## 2020-08-14 ENCOUNTER — Other Ambulatory Visit: Payer: Self-pay

## 2020-08-14 DIAGNOSIS — M5116 Intervertebral disc disorders with radiculopathy, lumbar region: Secondary | ICD-10-CM | POA: Diagnosis not present

## 2020-08-14 DIAGNOSIS — M5416 Radiculopathy, lumbar region: Secondary | ICD-10-CM

## 2020-08-14 MED ORDER — METHYLPREDNISOLONE ACETATE 40 MG/ML INJ SUSP (RADIOLOG
120.0000 mg | Freq: Once | INTRAMUSCULAR | Status: AC
  Administered 2020-08-14: 120 mg via EPIDURAL

## 2020-08-14 MED ORDER — IOPAMIDOL (ISOVUE-M 200) INJECTION 41%
1.0000 mL | Freq: Once | INTRAMUSCULAR | Status: AC
  Administered 2020-08-14: 1 mL via EPIDURAL

## 2020-08-14 NOTE — Discharge Instructions (Signed)

## 2020-08-29 DIAGNOSIS — Z20828 Contact with and (suspected) exposure to other viral communicable diseases: Secondary | ICD-10-CM | POA: Diagnosis not present

## 2020-10-04 ENCOUNTER — Telehealth: Payer: Self-pay | Admitting: Radiology

## 2020-10-04 NOTE — Telephone Encounter (Signed)
Got a call from the answering service that Tonya Ingram from [facility] called states that the patient is in the dental chair, and they are wanting to know if patient needs premed for dental procedure. I called and advised that Dr. Trevor Mace protocol is for only 3  Months postop.  I advised that her surgery was 06/30/20 and so she past the date of needing it. I faxed a copy of the protocol list to them @ [phone].

## 2020-12-01 DIAGNOSIS — U071 COVID-19: Secondary | ICD-10-CM | POA: Diagnosis not present

## 2020-12-04 ENCOUNTER — Other Ambulatory Visit: Payer: Self-pay | Admitting: Family Medicine

## 2020-12-04 ENCOUNTER — Other Ambulatory Visit: Payer: Self-pay

## 2020-12-04 DIAGNOSIS — M5416 Radiculopathy, lumbar region: Secondary | ICD-10-CM

## 2020-12-04 NOTE — Progress Notes (Signed)
Pt called asking for an order for lumbar ESI at [facility] imaging[facility].   Order placed. Cathy from [facility] will call pt to schedule.

## 2020-12-15 ENCOUNTER — Ambulatory Visit
Admission: EM | Admit: 2020-12-15 | Discharge: 2020-12-15 | Disposition: A | Discharge status: Home / Self Care | Payer: PPO | Attending: Emergency Medicine | Admitting: Emergency Medicine

## 2020-12-15 ENCOUNTER — Other Ambulatory Visit: Payer: Self-pay

## 2020-12-15 ENCOUNTER — Encounter: Payer: Self-pay | Admitting: Emergency Medicine

## 2020-12-15 DIAGNOSIS — N390 Urinary tract infection, site not specified: Secondary | ICD-10-CM | POA: Insufficient documentation

## 2020-12-15 LAB — POCT URINALYSIS DIP (MANUAL ENTRY)
Bilirubin, UA: NEGATIVE
Glucose, UA: NEGATIVE mg/dL
Ketones, POC UA: NEGATIVE mg/dL
Nitrite, UA: NEGATIVE
Protein Ur, POC: NEGATIVE mg/dL
Spec Grav, UA: 1.01 (ref 1.010–1.025)
Urobilinogen, UA: 0.2 E.U./dL
pH, UA: 5.5 (ref 5.0–8.0)

## 2020-12-15 MED ORDER — SULFAMETHOXAZOLE-TRIMETHOPRIM 800-160 MG PO TABS: 1.0000 | ORAL_TABLET | Freq: Two times a day (BID) | ORAL | 0 refills | Status: AC

## 2020-12-15 NOTE — ED Provider Notes (Signed)
[facility]    CSN: [id number] Arrival date & time: 12/15/20  1201      History   Chief Complaint Chief Complaint  Patient presents with   Dysuria   Urinary Frequency    HPI Tonya Ingram is a 70 y.o. female history of hypothyroid, GERD, presenting today for evaluation of possible UTI.  Reports over the past 1 to 2 days has developed dysuria, urinary frequency as well as cloudy urine.  Reports slight chills, but no known fevers.  Denies significant history of UTIs.  Denies any vaginal discharge itching or irritation.  HPI  Past Medical History:  Diagnosis Date   Arthritis    Depression    GERD (gastroesophageal reflux disease)    Hypothyroidism    Raynaud's disease    Thyroid disease     Patient Active Problem List   Diagnosis Date Noted   Status post total replacement of left hip 06/30/2020   Unilateral primary osteoarthritis, left hip 04/03/2020    Past Surgical History:  Procedure Laterality Date   THYROIDECTOMY     TOTAL HIP ARTHROPLASTY Left 06/30/2020   Procedure: LEFT TOTAL HIP ARTHROPLASTY ANTERIOR APPROACH;  Surgeon: Mcarthur Rossetti, MD;  Location: [facility];  Service: Orthopedics;  Laterality: Left;   TUBAL LIGATION      OB History   No obstetric history on file.      Home Medications    Prior to Admission medications   Medication Sig Start Date End Date Taking? Authorizing Provider  Calcium Carbonate-Vitamin D 600-400 MG-UNIT tablet Take 1 tablet by mouth daily.   Yes [provider]  levothyroxine (SYNTHROID) 50 MCG tablet Take 50 mcg by mouth daily before breakfast.   Yes [provider]  magnesium oxide (MAG-OX) 400 MG tablet Take 400 mg by mouth daily.   Yes [provider]  NONFORMULARY OR COMPOUNDED ITEM Apply 1 application topically daily. Hormone Cream : estrogen and progesterone mixed   Yes [provider]  sulfamethoxazole-trimethoprim (BACTRIM DS) 800-160 MG tablet Take 1 tablet by  mouth 2 (two) times daily for 3 days. 12/15/20 12/18/20 Yes Amarilys Lyles C, PA-C  aspirin 81 MG chewable tablet CHEW 1 TABLET BY MOUTH 2 TIMES DAILY. 08/02/20   Mcarthur Rossetti, MD  escitalopram (LEXAPRO) 10 MG tablet Take 10 mg by mouth daily.    [provider]  furosemide (LASIX) 20 MG tablet Take 10 mg by mouth daily as needed for edema.    [provider]  meloxicam (MOBIC) 15 MG tablet Take one pill a day with food for 7 days and then prn thereafter Patient taking differently: Take 15 mg by mouth daily. 03/15/20   Hudnall, Sharyn Lull, MD  methocarbamol (ROBAXIN) 500 MG tablet Take 1 tablet (500 mg total) by mouth every 8 (eight) hours as needed for muscle spasms. 07/01/20   Magnant, Charles L, PA-C  oxyCODONE (OXY IR/ROXICODONE) 5 MG immediate release tablet Take 1-2 tablets (5-10 mg total) by mouth every 6 (six) hours as needed for moderate pain (pain score 4-6). 07/01/20   Magnant, Gerrianne Scale, PA-C    Family History Family History  Problem Relation Age of Onset   Parkinson's disease Mother    Arthritis Mother    Prostate cancer Father    Hypertension Father     Social History Social History   Tobacco Use   Smoking status: Never   Smokeless tobacco: Never  Vaping Use   Vaping Use: Never used  Substance Use Topics  Alcohol use: Never   Drug use: Never     Allergies   Codeine   Review of Systems Review of Systems  Constitutional:  Negative for fever.  Respiratory:  Negative for shortness of breath.   Cardiovascular:  Negative for chest pain.  Gastrointestinal:  Negative for abdominal pain, diarrhea, nausea and vomiting.  Genitourinary:  Positive for dysuria and frequency. Negative for flank pain, genital sores, hematuria, menstrual problem, vaginal bleeding, vaginal discharge and vaginal pain.  Musculoskeletal:  Negative for back pain.  Skin:  Negative for rash.  Neurological:  Negative for dizziness, light-headedness and headaches.    Physical  Exam Triage Vital Signs ED Triage Vitals [12/15/20 1213]  Enc Vitals Group     BP      Pulse      Resp      Temp      Temp src      SpO2      Weight      Height      Head Circumference      Peak Flow      Pain Score 0     Pain Loc      Pain Edu?      Excl. in [place]?    No data found.  Updated Vital Signs BP 108/69 (BP Location: Right Arm)   Pulse 66   Temp 97.9 F (36.6 C) (Oral)   Resp 13   SpO2 95%   Visual Acuity Right Eye Distance:   Left Eye Distance:   Bilateral Distance:    Right Eye Near:   Left Eye Near:    Bilateral Near:     Physical Exam Vitals and nursing note reviewed.  Constitutional:      Appearance: She is well-developed.     Comments: No acute distress  HENT:     Head: Normocephalic and atraumatic.     Nose: Nose normal.  Eyes:     Conjunctiva/sclera: Conjunctivae normal.  Cardiovascular:     Rate and Rhythm: Normal rate.  Pulmonary:     Effort: Pulmonary effort is normal. No respiratory distress.  Abdominal:     General: There is no distension.  Musculoskeletal:        General: Normal range of motion.     Cervical back: Neck supple.  Skin:    General: Skin is warm and dry.  Neurological:     Mental Status: She is alert and oriented to person, place, and time.     UC Treatments / Results  Labs (all labs ordered are listed, but only abnormal results are displayed) Labs Reviewed  POCT URINALYSIS DIP (MANUAL ENTRY) - Abnormal; Notable for the following components:      Result Value   Blood, UA trace-intact (*)    Leukocytes, UA Small (1+) (*)    All other components within normal limits  URINE CULTURE    EKG   Radiology No results found.  Procedures Procedures (including critical care time)  Medications Ordered in UC Medications - No data to display  Initial Impression / Assessment and Plan / UC Course  I have reviewed the triage vital signs and the nursing notes.  Pertinent labs & imaging results that were  available during my care of the patient were reviewed by me and considered in my medical decision making (see chart for details).     UA with small leuks, trace hemoglobin, will go ahead and empirically treat for UTI today with Bactrim twice daily x3 days, drink plenty  of fluids.  Discussed strict return precautions. Patient verbalized understanding and is agreeable with plan.  Final Clinical Impressions(s) / UC Diagnoses   Final diagnoses:  Lower urinary tract infection, acute     Discharge Instructions      Urine showed evidence of infection. We are treating you with bactrim- twice daily. Be sure to take full course. Stay hydrated- urine should be pale yellow to clear.   Please return or follow up with your primary provider if symptoms not improving with treatment. Please return sooner if you have worsening of symptoms or develop fever, nausea, vomiting, abdominal pain, back pain, lightheadedness, dizziness.     ED Prescriptions     Medication Sig Dispense Auth. Provider   sulfamethoxazole-trimethoprim (BACTRIM DS) 800-160 MG tablet Take 1 tablet by mouth 2 (two) times daily for 3 days. 6 tablet Taron Conrey, [place] C, PA-C      PDMP not reviewed this encounter.   Janith Lima, PA-C 12/15/20 1245

## 2020-12-15 NOTE — Discharge Instructions (Addendum)
Urine showed evidence of infection. We are treating you with bactrim- twice daily. Be sure to take full course. Stay hydrated- urine should be pale yellow to clear.   Please return or follow up with your primary provider if symptoms not improving with treatment. Please return sooner if you have worsening of symptoms or develop fever, nausea, vomiting, abdominal pain, back pain, lightheadedness, dizziness.

## 2020-12-15 NOTE — ED Triage Notes (Signed)
Patient c/o dysuria and urinary frequency that started yesterday.   Patient endorses a temperature of 99 F this morning.   Patient endorses "burning when peeing".   Patient denies ABD pain, back pain, hematuria, or foul odor.   Patient endorses a cloudy appearance.   Patient has taken Tylenol at home.

## 2020-12-16 LAB — URINE CULTURE: Culture: NO GROWTH

## 2020-12-18 ENCOUNTER — Encounter: Payer: Self-pay | Admitting: Emergency Medicine

## 2020-12-18 ENCOUNTER — Ambulatory Visit
Admission: EM | Admit: 2020-12-18 | Discharge: 2020-12-18 | Disposition: A | Discharge status: Home / Self Care | Payer: PPO | Attending: Emergency Medicine | Admitting: Emergency Medicine

## 2020-12-18 ENCOUNTER — Other Ambulatory Visit: Payer: Self-pay

## 2020-12-18 DIAGNOSIS — L820 Inflamed seborrheic keratosis: Secondary | ICD-10-CM | POA: Diagnosis not present

## 2020-12-18 DIAGNOSIS — N760 Acute vaginitis: Secondary | ICD-10-CM | POA: Insufficient documentation

## 2020-12-18 DIAGNOSIS — R21 Rash and other nonspecific skin eruption: Secondary | ICD-10-CM | POA: Insufficient documentation

## 2020-12-18 DIAGNOSIS — L570 Actinic keratosis: Secondary | ICD-10-CM | POA: Diagnosis not present

## 2020-12-18 MED ORDER — CEFTRIAXONE SODIUM 500 MG IJ SOLR
500.0000 mg | Freq: Once | INTRAMUSCULAR | Status: AC
  Administered 2020-12-18: 500 mg via INTRAMUSCULAR

## 2020-12-18 MED ORDER — VALACYCLOVIR HCL 1 G PO TABS: 1000.0000 mg | ORAL_TABLET | Freq: Two times a day (BID) | ORAL | 1 refills | Status: AC

## 2020-12-18 MED ORDER — DOXYCYCLINE HYCLATE 100 MG PO CAPS: 100.0000 mg | ORAL_CAPSULE | Freq: Two times a day (BID) | ORAL | 0 refills | Status: AC

## 2020-12-18 NOTE — ED Provider Notes (Signed)
HPI  SUBJECTIVE:  Tonya Ingram is a 70 y.o. female who presents with 3 days of dysuria, bloody vaginal discharge, labial swelling, itching, painful blisters.  No lip or tongue swelling, nausea, vomiting, fevers cloudy or odorous urine.  She just finished 3 days of Bactrim for UTI and states that the urinary urgency and frequency have resolved.  No hematuria.  She has been wearing pantiliners for the vaginal discharge.  She is in a relatively new sexual relationship with a female who is asymptomatic.  She is not sure if he has any other partners.  STDs are a concern today.  She has tried Monistat suppositories and cream without improvement in her symptoms.  Symptoms are worse with urination.  Past medical history negative for diabetes, STDs, herpes, BV, yeast.  MVH:QIONGE, Apolonio Schneiders, MD   Past Medical History:  Diagnosis Date   Arthritis    Depression    GERD (gastroesophageal reflux disease)    Hypothyroidism    Raynaud's disease    Thyroid disease     Past Surgical History:  Procedure Laterality Date   THYROIDECTOMY     TOTAL HIP ARTHROPLASTY Left 06/30/2020   Procedure: LEFT TOTAL HIP ARTHROPLASTY ANTERIOR APPROACH;  Surgeon: Mcarthur Rossetti, MD;  Location: [facility];  Service: Orthopedics;  Laterality: Left;   TUBAL LIGATION      Family History  Problem Relation Age of Onset   Parkinson's disease Mother    Arthritis Mother    Prostate cancer Father    Hypertension Father     Social History   Tobacco Use   Smoking status: Never   Smokeless tobacco: Never  Vaping Use   Vaping Use: Never used  Substance Use Topics   Alcohol use: Never   Drug use: Never    No current facility-administered medications for this encounter.  Current Outpatient Medications:    Calcium Carbonate-Vitamin D 600-400 MG-UNIT tablet, Take 1 tablet by mouth daily., Disp: , Rfl:    doxycycline (VIBRAMYCIN) 100 MG capsule, Take 1 capsule (100 mg total) by mouth 2 (two) times daily for 7 days.,  Disp: 14 capsule, Rfl: 0   escitalopram (LEXAPRO) 10 MG tablet, Take 10 mg by mouth daily., Disp: , Rfl:    furosemide (LASIX) 20 MG tablet, Take 10 mg by mouth daily as needed for edema., Disp: , Rfl:    levothyroxine (SYNTHROID) 50 MCG tablet, Take 50 mcg by mouth daily before breakfast., Disp: , Rfl:    magnesium oxide (MAG-OX) 400 MG tablet, Take 400 mg by mouth daily., Disp: , Rfl:    NONFORMULARY OR COMPOUNDED ITEM, Apply 1 application topically daily. Hormone Cream : estrogen and progesterone mixed, Disp: , Rfl:    sulfamethoxazole-trimethoprim (BACTRIM DS) 800-160 MG tablet, Take 1 tablet by mouth 2 (two) times daily for 3 days., Disp: 6 tablet, Rfl: 0   valACYclovir (VALTREX) 1000 MG tablet, Take 1 tablet (1,000 mg total) by mouth 2 (two) times daily for 10 days., Disp: 20 tablet, Rfl: 1   aspirin 81 MG chewable tablet, CHEW 1 TABLET BY MOUTH 2 TIMES DAILY., Disp: 72 tablet, Rfl: 0   meloxicam (MOBIC) 15 MG tablet, Take one pill a day with food for 7 days and then prn thereafter (Patient taking differently: Take 15 mg by mouth daily.), Disp: 40 tablet, Rfl: 2   methocarbamol (ROBAXIN) 500 MG tablet, Take 1 tablet (500 mg total) by mouth every 8 (eight) hours as needed for muscle spasms., Disp: 30 tablet, Rfl: 0   oxyCODONE (  OXY IR/ROXICODONE) 5 MG immediate release tablet, Take 1-2 tablets (5-10 mg total) by mouth every 6 (six) hours as needed for moderate pain (pain score 4-6)., Disp: 30 tablet, Rfl: 0  Allergies  Allergen Reactions   Codeine Nausea And Vomiting     ROS  As noted in HPI.   Physical Exam  BP (!) 110/55 (BP Location: Left Arm)   Pulse 69   SpO2 97%   Constitutional: Well developed, well nourished, no acute distress Eyes:  EOMI, conjunctiva normal bilaterally HENT: Normocephalic, atraumatic,mucus membranes moist Respiratory: Normal inspiratory effort Cardiovascular: Normal rate GI: nondistended soft, nontender. No suprapubic tenderness  back: No CVA  tenderness GU: Tender vesicular rash/shallow painful ulcers on bilateral labia.  Extensive yellowish nonodorous vaginal discharge.  Uterus smooth, NT. No  CMT. No adnexal tenderness. No adnexal masses.  Chaperone present during exam skin: No rash, skin intact Musculoskeletal: no deformities Neurologic: Alert & oriented x 3, no focal neuro deficits Psychiatric: Speech and behavior appropriate   ED Course   Medications  cefTRIAXone (ROCEPHIN) injection 500 mg (500 mg Intramuscular Given 12/18/20 0932)    Orders Placed This Encounter  Procedures   Hsv Culture And Typing    Standing Status:   Standing    Number of Occurrences:   1    No results found for this or any previous visit (from the past 24 hour(s)). No results found.  ED Clinical Impression  1. Rash of genital area   2. Vaginitis and vulvovaginitis      ED Assessment/Plan  No evidence of Stevens-Johnson syndrome.  No labial swelling.  Concern for new onset herpes with accompanying STD such as gonorrhea or chlamydia.  Patient would like to be empirically treated for gonorrhea and chlamydia.  Will give 500 mg of Rocephin here, send home on doxycycline and Valtrex.  Advised sitz bath's.  Follow-up with PMD or GYN as needed. Discussed labs, MDM, plan and followup with patient. Pt agrees with plan.   Meds ordered this encounter  Medications   cefTRIAXone (ROCEPHIN) injection 500 mg   valACYclovir (VALTREX) 1000 MG tablet    Sig: Take 1 tablet (1,000 mg total) by mouth 2 (two) times daily for 10 days.    Dispense:  20 tablet    Refill:  1   doxycycline (VIBRAMYCIN) 100 MG capsule    Sig: Take 1 capsule (100 mg total) by mouth 2 (two) times daily for 7 days.    Dispense:  14 capsule    Refill:  0    *This clinic note was created using [employer]. Therefore, there may be occasional mistakes despite careful proofreading.  ?     Melynda Ripple, MD 12/18/20 1555

## 2020-12-18 NOTE — ED Triage Notes (Signed)
Patient states that she was seen on Friday, dx w/UTI given Bactrim, completed the course.  Now the patient is having vaginal swelling, the area is painful, itching, possible herpes?  Patient has had the same partner for about 2 months.

## 2020-12-18 NOTE — Discharge Instructions (Addendum)
Your testing will be back in several days.  We have treated you empirically for gonorrhea with Rocephin today.  Finish the doxycycline, unless your chlamydia comes back negative.  Finish the Valtrex.  I strongly suspect that this is herpes.  The viral culture is not right 100% of the time.  If these lesions come back, start Valtrex 1 g once daily for 5 days as soon as your symptoms show up.  Sitz baths as we discussed.

## 2020-12-19 LAB — CERVICOVAGINAL ANCILLARY ONLY
Bacterial Vaginitis (gardnerella): NEGATIVE
Chlamydia: NEGATIVE
Comment: NEGATIVE
Comment: NEGATIVE
Comment: NEGATIVE
Comment: NORMAL
Neisseria Gonorrhea: NEGATIVE
Trichomonas: NEGATIVE

## 2020-12-21 ENCOUNTER — Inpatient Hospital Stay: Admission: RE | Admit: 2020-12-21 | Payer: PPO | Source: Ambulatory Visit

## 2020-12-21 LAB — HSV CULTURE AND TYPING

## 2020-12-26 ENCOUNTER — Emergency Department (HOSPITAL_COMMUNITY)
Admission: EM | Admit: 2020-12-26 | Discharge: 2020-12-26 | Disposition: A | Discharge status: Home / Self Care | Payer: PPO | Attending: Emergency Medicine | Admitting: Emergency Medicine

## 2020-12-26 ENCOUNTER — Other Ambulatory Visit: Payer: Self-pay

## 2020-12-26 ENCOUNTER — Emergency Department (HOSPITAL_COMMUNITY): Payer: PPO

## 2020-12-26 ENCOUNTER — Ambulatory Visit
Admission: EM | Admit: 2020-12-26 | Discharge: 2020-12-26 | Disposition: A | Discharge status: Home / Self Care | Payer: PPO

## 2020-12-26 ENCOUNTER — Encounter (HOSPITAL_COMMUNITY): Payer: Self-pay | Admitting: *Deleted

## 2020-12-26 DIAGNOSIS — R338 Other retention of urine: Secondary | ICD-10-CM

## 2020-12-26 DIAGNOSIS — E039 Hypothyroidism, unspecified: Secondary | ICD-10-CM | POA: Diagnosis not present

## 2020-12-26 DIAGNOSIS — R339 Retention of urine, unspecified: Secondary | ICD-10-CM

## 2020-12-26 DIAGNOSIS — R309 Painful micturition, unspecified: Secondary | ICD-10-CM | POA: Insufficient documentation

## 2020-12-26 DIAGNOSIS — Z7982 Long term (current) use of aspirin: Secondary | ICD-10-CM | POA: Diagnosis not present

## 2020-12-26 DIAGNOSIS — M545 Low back pain, unspecified: Secondary | ICD-10-CM | POA: Diagnosis not present

## 2020-12-26 DIAGNOSIS — Z96642 Presence of left artificial hip joint: Secondary | ICD-10-CM | POA: Insufficient documentation

## 2020-12-26 DIAGNOSIS — Z79899 Other long term (current) drug therapy: Secondary | ICD-10-CM | POA: Insufficient documentation

## 2020-12-26 LAB — COMPREHENSIVE METABOLIC PANEL
ALT: 24 U/L (ref 0–44)
AST: 30 U/L (ref 15–41)
Albumin: 4 g/dL (ref 3.5–5.0)
Alkaline Phosphatase: 57 U/L (ref 38–126)
Anion gap: 7 (ref 5–15)
BUN: 21 mg/dL (ref 8–23)
CO2: 26 mmol/L (ref 22–32)
Calcium: 8.9 mg/dL (ref 8.9–10.3)
Chloride: 106 mmol/L (ref 98–111)
Creatinine, Ser: 0.83 mg/dL (ref 0.44–1.00)
GFR, Estimated: >60 mL/min (ref >60)
Glucose, Bld: 87 mg/dL (ref 70–99)
Potassium: 5 mmol/L (ref 3.5–5.1)
Sodium: 139 mmol/L (ref 135–145)
Total Bilirubin: 0.5 mg/dL (ref 0.3–1.2)
Total Protein: 6.9 g/dL (ref 6.5–8.1)

## 2020-12-26 LAB — CBC WITH DIFFERENTIAL/PLATELET
Abs Immature Granulocytes: 0.07 10*3/uL (ref 0.00–0.07)
Basophils Absolute: 0 10*3/uL (ref 0.0–0.1)
Basophils Relative: 1 %
Eosinophils Absolute: 0.2 10*3/uL (ref 0.0–0.5)
Eosinophils Relative: 3 %
HCT: 34.6 % — ABNORMAL LOW (ref 36.0–46.0)
Hemoglobin: 11.1 g/dL — ABNORMAL LOW (ref 12.0–15.0)
Immature Granulocytes: 1 %
Lymphocytes Relative: 34 %
Lymphs Abs: 2.2 10*3/uL (ref 0.7–4.0)
MCH: 30.7 pg (ref 26.0–34.0)
MCHC: 32.1 g/dL (ref 30.0–36.0)
MCV: 95.6 fL (ref 80.0–100.0)
Monocytes Absolute: 0.5 10*3/uL (ref 0.1–1.0)
Monocytes Relative: 8 %
Neutro Abs: 3.4 10*3/uL (ref 1.7–7.7)
Neutrophils Relative %: 53 %
Platelets: 309 10*3/uL (ref 150–400)
RBC: 3.62 MIL/uL — ABNORMAL LOW (ref 3.87–5.11)
RDW: 13.2 % (ref 11.5–15.5)
WBC: 6.4 10*3/uL (ref 4.0–10.5)
nRBC: 0 % (ref 0.0–0.2)

## 2020-12-26 LAB — URINALYSIS, ROUTINE W REFLEX MICROSCOPIC
Bilirubin Urine: NEGATIVE
Glucose, UA: NEGATIVE mg/dL
Hgb urine dipstick: NEGATIVE
Ketones, ur: NEGATIVE mg/dL
Leukocytes,Ua: NEGATIVE
Nitrite: NEGATIVE
Protein, ur: NEGATIVE mg/dL
Specific Gravity, Urine: 1.01 (ref 1.005–1.030)
pH: 6 (ref 5.0–8.0)

## 2020-12-26 NOTE — ED Provider Notes (Signed)
Recently diagnosed with genital herpes on treatment, recently developed urinary retention.  Does have hx of back disease.  Awaits MRI of lumbar spine.  Pt has a foley placed in the ER.    7:46 PM Labs are reassuring, UA without signs of urinary tract infection, normal WBC, electrolyte panels are reassuring, an MRI of the lumbar spine demonstrate progressive facet mediated anterolisthesis at the level of L3-L4 and L4-L5.  Unchanged severe spinal canal and right greater than left lateral recess stenosis at L4-5.  Unchanged moderate spinal canal stenosis at L3-L4.  No evidence of cauda equina.  Given this finding, patient will go home with indwelling Foley catheter and will need to follow-up with urology in the next 3 to 4 days for reassessment.  Pt voice understanding and agrees with plan.   BP (!) 147/91   Pulse 69   Temp 97.7 F (36.5 C) (Oral)   Resp 18   SpO2 98%   Results for orders placed or performed during the hospital encounter of 12/26/20  Urinalysis, Routine w reflex microscopic Urine, Catheterized  Result Value Ref Range   Color, Urine STRAW (A) YELLOW   APPearance CLEAR CLEAR   Specific Gravity, Urine 1.010 1.005 - 1.030   pH 6.0 5.0 - 8.0   Glucose, UA NEGATIVE NEGATIVE mg/dL   Hgb urine dipstick NEGATIVE NEGATIVE   Bilirubin Urine NEGATIVE NEGATIVE   Ketones, ur NEGATIVE NEGATIVE mg/dL   Protein, ur NEGATIVE NEGATIVE mg/dL   Nitrite NEGATIVE NEGATIVE   Leukocytes,Ua NEGATIVE NEGATIVE  CBC with Differential/Platelet  Result Value Ref Range   WBC 6.4 4.0 - 10.5 K/uL   RBC 3.62 (L) 3.87 - 5.11 MIL/uL   Hemoglobin 11.1 (L) 12.0 - 15.0 g/dL   HCT 34.6 (L) 36.0 - 46.0 %   MCV 95.6 80.0 - 100.0 fL   MCH 30.7 26.0 - 34.0 pg   MCHC 32.1 30.0 - 36.0 g/dL   RDW 13.2 11.5 - 15.5 %   Platelets 309 150 - 400 K/uL   nRBC 0.0 0.0 - 0.2 %   Neutrophils Relative % 53 %   Neutro Abs 3.4 1.7 - 7.7 K/uL   Lymphocytes Relative 34 %   Lymphs Abs 2.2 0.7 - 4.0 K/uL   Monocytes  Relative 8 %   Monocytes Absolute 0.5 0.1 - 1.0 K/uL   Eosinophils Relative 3 %   Eosinophils Absolute 0.2 0.0 - 0.5 K/uL   Basophils Relative 1 %   Basophils Absolute 0.0 0.0 - 0.1 K/uL   Immature Granulocytes 1 %   Abs Immature Granulocytes 0.07 0.00 - 0.07 K/uL  Comprehensive metabolic panel  Result Value Ref Range   Sodium 139 135 - 145 mmol/L   Potassium 5.0 3.5 - 5.1 mmol/L   Chloride 106 98 - 111 mmol/L   CO2 26 22 - 32 mmol/L   Glucose, Bld 87 70 - 99 mg/dL   BUN 21 8 - 23 mg/dL   Creatinine, Ser 0.83 0.44 - 1.00 mg/dL   Calcium 8.9 8.9 - 10.3 mg/dL   Total Protein 6.9 6.5 - 8.1 g/dL   Albumin 4.0 3.5 - 5.0 g/dL   AST 30 15 - 41 U/L   ALT 24 0 - 44 U/L   Alkaline Phosphatase 57 38 - 126 U/L   Total Bilirubin 0.5 0.3 - 1.2 mg/dL   GFR, Estimated >60 >60 mL/min   Anion gap 7 5 - 15   MR LUMBAR SPINE WO CONTRAST  Result Date: 12/26/2020 CLINICAL DATA:  Low back pain and urinary retention. EXAM: MRI LUMBAR SPINE WITHOUT CONTRAST TECHNIQUE: Multiplanar, multisequence MR imaging of the lumbar spine was performed. No intravenous contrast was administered. COMPARISON:  MRI lumbar spine dated December 22, 2019. FINDINGS: Segmentation:  Standard. Alignment: Increased now 4 mm anterolisthesis at L3-L4. Increased now 6 mm anterolisthesis at L4-L5. Vertebrae:  No fracture, evidence of discitis, or bone lesion. Conus medullaris and cauda equina: Conus extends to the L1-L2 level. Conus and cauda equina appear normal. Paraspinal and other soft tissues: Negative. Disc levels: T12-L1 to L2-L3:  Negative. L3-L4: Progressive disc uncovering with unchanged mild disc bulging. Progressive severe bilateral facet arthropathy with ligamentum flavum hypertrophy. Unchanged moderate spinal canal and mild left neuroforaminal stenosis. L4-L5: Progressive disc uncovering with unchanged mild disc bulging eccentric to the right. Unchanged severe bilateral facet arthropathy with ligamentum flavum hypertrophy. Unchanged  severe spinal canal and right greater than left lateral recess stenosis. Unchanged mild bilateral neuroforaminal stenosis. L5-S1: Unchanged small broad-based posterior disc protrusion. No stenosis. IMPRESSION: 1. Progressive facet mediated anterolisthesis at L3-L4 and L4-L5. Unchanged severe spinal canal and right greater than left lateral recess stenosis at L4-L5. 2. Unchanged moderate spinal canal stenosis at L3-L4. Electronically Signed   By: Titus Dubin M.D.   On: 12/26/2020 19:23      Domenic Moras, PA-C 12/26/20 Abbey Chatters, MD 12/31/20 1700

## 2020-12-26 NOTE — ED Triage Notes (Signed)
Pt reports retaining urine for the past few days. She urinated a small amount this morning. She was treated for genital herpes on 6/27, still taking valtrex.

## 2020-12-26 NOTE — Discharge Instructions (Addendum)
Please go to the hospital today as soon as you leave urgent care for further evaluation and management of urinary retention.

## 2020-12-26 NOTE — ED Provider Notes (Signed)
[facility]    CSN: [id number] Arrival date & time: 12/26/20  0941      History   Chief Complaint Chief Complaint  Patient presents with   Follow-up   Urinary Retention    HPI Tonya Ingram is a 70 y.o. female.   Patient presents today for a 5-day history of difficulty urinating and urinary retention.  Patient states that she has only been able to urinate about twice in 5 days.  Patient last voided this morning at about 4 AM but states that she only voided about 50 cc and feels like her bladder is still full.  Patient was seen on 12/18/2020 due to vaginal sores and dysuria and tested positive for herpes simplex virus type 2.  Was treated with 10-day course of valacyclovir and is currently on day 9.  Has been tolerating well and taking as prescribed.  Patient reports that genital sores have mostly cleared up.  Reports that she has been using rubbing alcohol on sores and labia after she completed additional research for treatment for herpes that suggested this.  Denies any urinary burning or abdominal pain.  Denies vaginal discharge.    Past Medical History:  Diagnosis Date   Arthritis    Depression    GERD (gastroesophageal reflux disease)    Hypothyroidism    Raynaud's disease    Thyroid disease     Patient Active Problem List   Diagnosis Date Noted   Status post total replacement of left hip 06/30/2020   Unilateral primary osteoarthritis, left hip 04/03/2020    Past Surgical History:  Procedure Laterality Date   THYROIDECTOMY     TOTAL HIP ARTHROPLASTY Left 06/30/2020   Procedure: LEFT TOTAL HIP ARTHROPLASTY ANTERIOR APPROACH;  Surgeon: Mcarthur Rossetti, MD;  Location: [facility];  Service: Orthopedics;  Laterality: Left;   TUBAL LIGATION      OB History   No obstetric history on file.      Home Medications    Prior to Admission medications   Medication Sig Start Date End Date Taking? Authorizing Provider  aspirin 81 MG chewable tablet CHEW 1  TABLET BY MOUTH 2 TIMES DAILY. 08/02/20   Mcarthur Rossetti, MD  Calcium Carbonate-Vitamin D 600-400 MG-UNIT tablet Take 1 tablet by mouth daily.    [provider]  escitalopram (LEXAPRO) 10 MG tablet Take 10 mg by mouth daily.    [provider]  furosemide (LASIX) 20 MG tablet Take 10 mg by mouth daily as needed for edema.    [provider]  levothyroxine (SYNTHROID) 50 MCG tablet Take 50 mcg by mouth daily before breakfast.    [provider]  magnesium oxide (MAG-OX) 400 MG tablet Take 400 mg by mouth daily.    [provider]  meloxicam (MOBIC) 15 MG tablet Take one pill a day with food for 7 days and then prn thereafter Patient taking differently: Take 15 mg by mouth daily. 03/15/20   Hudnall, Sharyn Lull, MD  methocarbamol (ROBAXIN) 500 MG tablet Take 1 tablet (500 mg total) by mouth every 8 (eight) hours as needed for muscle spasms. 07/01/20   Magnant, Gerrianne Scale, PA-C  NONFORMULARY OR COMPOUNDED ITEM Apply 1 application topically daily. Hormone Cream : estrogen and progesterone mixed    [provider]  oxyCODONE (OXY IR/ROXICODONE) 5 MG immediate release tablet Take 1-2 tablets (5-10 mg total) by mouth every 6 (six) hours as needed for moderate pain (pain score 4-6). 07/01/20   Magnant, Gerrianne Scale,  PA-C  valACYclovir (VALTREX) 1000 MG tablet Take 1 tablet (1,000 mg total) by mouth 2 (two) times daily for 10 days. 12/18/20 12/28/20  Melynda Ripple, MD    Family History Family History  Problem Relation Age of Onset   Parkinson's disease Mother    Arthritis Mother    Prostate cancer Father    Hypertension Father     Social History Social History   Tobacco Use   Smoking status: Never   Smokeless tobacco: Never  Vaping Use   Vaping Use: Never used  Substance Use Topics   Alcohol use: Never   Drug use: Never     Allergies   Codeine   Review of Systems Review of Systems Per HPI  Physical Exam Triage Vital Signs ED  Triage Vitals  Enc Vitals Group     BP 12/26/20 1130 (!) 147/66     Pulse Rate 12/26/20 1130 90     Resp 12/26/20 1130 16     Temp 12/26/20 1130 97.7 F (36.5 C)     Temp Source 12/26/20 1130 Oral     SpO2 12/26/20 1130 97 %     Weight --      Height --      Head Circumference --      Peak Flow --      Pain Score 12/26/20 1129 0     Pain Loc --      Pain Edu? --      Excl. in [place]? --    No data found.  Updated Vital Signs BP (!) 147/66 (BP Location: Left Arm)   Pulse 90   Temp 97.7 F (36.5 C) (Oral)   Resp 16   SpO2 97%   Visual Acuity Right Eye Distance:   Left Eye Distance:   Bilateral Distance:    Right Eye Near:   Left Eye Near:    Bilateral Near:     Physical Exam Constitutional:      General: She is not in acute distress.    Appearance: Normal appearance.  HENT:     Head: Normocephalic and atraumatic.  Eyes:     Extraocular Movements: Extraocular movements intact.     Conjunctiva/sclera: Conjunctivae normal.  Pulmonary:     Effort: Pulmonary effort is normal.     Breath sounds: Normal breath sounds.  Abdominal:     General: Abdomen is flat.     Palpations: Abdomen is soft.     Tenderness: There is no abdominal tenderness.  Genitourinary:    Comments: Deferred.  Patient sent to hospital. Skin:    General: Skin is warm and dry.  Neurological:     General: No focal deficit present.     Mental Status: She is alert and oriented to person, place, and time. Mental status is at baseline.  Psychiatric:        Mood and Affect: Mood normal.        Behavior: Behavior normal.        Thought Content: Thought content normal.        Judgment: Judgment normal.     UC Treatments / Results  Labs (all labs ordered are listed, but only abnormal results are displayed) Labs Reviewed - No data to display  EKG   Radiology No results found.  Procedures Procedures (including critical care time)  Medications Ordered in UC Medications - No data to  display  Initial Impression / Assessment and Plan / UC Course  I have reviewed the triage vital signs  and the nursing notes.  Pertinent labs & imaging results that were available during my care of the patient were reviewed by me and considered in my medical decision making (see chart for details).     Concern for involvement of sacral nerve from genital herpes that could be causing urinary retention.  Due to possible need of IV antiviral therapy, patient was sent to hospital from urgent care to be further evaluated and managed.  Patient was agreeable with plan.  Unable to provide urine specimen in office.  Will defer to emergency room. Final Clinical Impressions(s) / UC Diagnoses   Final diagnoses:  Urinary retention     Discharge Instructions      Please go to the hospital today as soon as you leave urgent care for further evaluation and management of urinary retention.     ED Prescriptions   None    PDMP not reviewed this encounter.   Odis Luster, FNP 12/26/20 [phone]

## 2020-12-26 NOTE — ED Triage Notes (Signed)
Patient presents to Urgent Care for follow-up from 06/27 visit. She states she was treated for herpes and treating with valtrax. She states she continues to exp urinary urgency and retention.   Denies fever or abdominal pain.

## 2020-12-26 NOTE — ED Notes (Signed)
MRI tech messaged to give to timeframe of when pt may be going to MRI.

## 2020-12-26 NOTE — ED Provider Notes (Signed)
[place] DEPT Provider Note   CSN: [id number] Arrival date & time: 12/26/20  1211     History Chief Complaint  Patient presents with   Urinary Retention    Tonya Ingram is a 70 y.o. female.  Pt complains of not being able to urinate.  Pt reports her lower abdomen feels swollen.  Pt reports she was diagnosed with genital herpes on 6/27.  Pt reports painful urination.  Pt has a history of low back pain Pt gets epidural spinal injections.  Pt reports she was due for an injection last week   The history is provided by the patient. No language interpreter was used.      Past Medical History:  Diagnosis Date   Arthritis    Depression    GERD (gastroesophageal reflux disease)    Hypothyroidism    Raynaud's disease    Thyroid disease     Patient Active Problem List   Diagnosis Date Noted   Status post total replacement of left hip 06/30/2020   Unilateral primary osteoarthritis, left hip 04/03/2020    Past Surgical History:  Procedure Laterality Date   THYROIDECTOMY     TOTAL HIP ARTHROPLASTY Left 06/30/2020   Procedure: LEFT TOTAL HIP ARTHROPLASTY ANTERIOR APPROACH;  Surgeon: Mcarthur Rossetti, MD;  Location: [facility];  Service: Orthopedics;  Laterality: Left;   TUBAL LIGATION       OB History   No obstetric history on file.     Family History  Problem Relation Age of Onset   Parkinson's disease Mother    Arthritis Mother    Prostate cancer Father    Hypertension Father     Social History   Tobacco Use   Smoking status: Never   Smokeless tobacco: Never  Vaping Use   Vaping Use: Never used  Substance Use Topics   Alcohol use: Never   Drug use: Never    Home Medications Prior to Admission medications   Medication Sig Start Date End Date Taking? Authorizing Provider  aspirin 81 MG chewable tablet CHEW 1 TABLET BY MOUTH 2 TIMES DAILY. 08/02/20   Mcarthur Rossetti, MD  Calcium Carbonate-Vitamin D 600-400 MG-UNIT  tablet Take 1 tablet by mouth daily.    [provider]  escitalopram (LEXAPRO) 10 MG tablet Take 10 mg by mouth daily.    [provider]  furosemide (LASIX) 20 MG tablet Take 10 mg by mouth daily as needed for edema.    [provider]  levothyroxine (SYNTHROID) 50 MCG tablet Take 50 mcg by mouth daily before breakfast.    [provider]  magnesium oxide (MAG-OX) 400 MG tablet Take 400 mg by mouth daily.    [provider]  meloxicam (MOBIC) 15 MG tablet Take one pill a day with food for 7 days and then prn thereafter Patient taking differently: Take 15 mg by mouth daily. 03/15/20   Hudnall, Sharyn Lull, MD  methocarbamol (ROBAXIN) 500 MG tablet Take 1 tablet (500 mg total) by mouth every 8 (eight) hours as needed for muscle spasms. 07/01/20   Magnant, Gerrianne Scale, PA-C  NONFORMULARY OR COMPOUNDED ITEM Apply 1 application topically daily. Hormone Cream : estrogen and progesterone mixed    [provider]  oxyCODONE (OXY IR/ROXICODONE) 5 MG immediate release tablet Take 1-2 tablets (5-10 mg total) by mouth every 6 (six) hours as needed for moderate pain (pain score 4-6). 07/01/20   Magnant, Charles L, PA-C  valACYclovir (VALTREX) 1000 MG tablet Take 1  tablet (1,000 mg total) by mouth 2 (two) times daily for 10 days. 12/18/20 12/28/20  Melynda Ripple, MD    Allergies    Codeine  Review of Systems   Review of Systems  All other systems reviewed and are negative.  Physical Exam Updated Vital Signs BP (!) 141/82   Pulse (!) 53   Temp 97.7 F (36.5 C) (Oral)   Resp 18   SpO2 100%   Physical Exam Vitals and nursing note reviewed.  Constitutional:      Appearance: She is well-developed.  HENT:     Head: Normocephalic.  Cardiovascular:     Rate and Rhythm: Normal rate.  Pulmonary:     Effort: Pulmonary effort is normal.  Abdominal:     General: Abdomen is flat. There is no distension.  Musculoskeletal:        General: Normal range of  motion.     Cervical back: Normal range of motion.  Skin:    General: Skin is warm.  Neurological:     General: No focal deficit present.     Mental Status: She is alert and oriented to person, place, and time.    ED Results / Procedures / Treatments   Labs (all labs ordered are listed, but only abnormal results are displayed) Labs Reviewed  URINALYSIS, ROUTINE W REFLEX MICROSCOPIC  CBC WITH DIFFERENTIAL/PLATELET  COMPREHENSIVE METABOLIC PANEL    EKG None  Radiology No results found.  Procedures Procedures   Medications Ordered in ED Medications - No data to display  ED Course  I have reviewed the triage vital signs and the nursing notes.  Pertinent labs & imaging results that were available during my care of the patient were reviewed by me and considered in my medical decision making (see chart for details).    MDM Rules/Calculators/A&P                          MDM:  Bladder scan shows 600cc of urine  Ua negative, BUN and creat normal.  Pt discussed with Dr. Regenia Skeeter.  MRi ordered.   Domenic Moras [facility] assumed pt's care  Final Clinical Impression(s) / ED Diagnoses Final diagnoses:  Urinary retention    Rx / DC Orders ED Discharge Orders     None        Sidney Ace 12/26/20 1523    Sherwood Gambler, MD 12/31/20 [phone]

## 2020-12-26 NOTE — ED Notes (Signed)
Patient is being discharged from the Urgent Care and sent to the Emergency Department via [place] Per Vickki Muff, NP patient is in need of higher level of care due to Urinary Retention. Patient is aware and verbalizes understanding of plan of care.  Vitals:   12/26/20 1130  BP: (!) 147/66  Pulse: 90  Resp: 16  Temp: 97.7 F (36.5 C)  SpO2: 97%

## 2020-12-26 NOTE — Discharge Instructions (Addendum)
Please call and follow-up closely with [facility] for further evaluation and management of your acute urinary retention.  Return if you have any concern.

## 2020-12-28 ENCOUNTER — Ambulatory Visit
Admission: RE | Admit: 2020-12-28 | Discharge: 2020-12-28 | Disposition: A | Discharge status: Home / Self Care | Payer: PPO | Source: Ambulatory Visit | Attending: Family Medicine | Admitting: Family Medicine

## 2020-12-28 ENCOUNTER — Other Ambulatory Visit: Payer: Self-pay

## 2020-12-28 DIAGNOSIS — M47817 Spondylosis without myelopathy or radiculopathy, lumbosacral region: Secondary | ICD-10-CM | POA: Diagnosis not present

## 2020-12-28 DIAGNOSIS — M5416 Radiculopathy, lumbar region: Secondary | ICD-10-CM

## 2020-12-28 MED ORDER — IOPAMIDOL (ISOVUE-M 200) INJECTION 41%
1.0000 mL | Freq: Once | INTRAMUSCULAR | Status: AC
  Administered 2020-12-28: 1 mL via EPIDURAL

## 2020-12-28 MED ORDER — METHYLPREDNISOLONE ACETATE 40 MG/ML INJ SUSP (RADIOLOG
80.0000 mg | Freq: Once | INTRAMUSCULAR | Status: AC
  Administered 2020-12-28: 80 mg via EPIDURAL

## 2020-12-28 NOTE — Discharge Instructions (Signed)

## 2020-12-29 DIAGNOSIS — R339 Retention of urine, unspecified: Secondary | ICD-10-CM | POA: Diagnosis not present

## 2021-01-15 DIAGNOSIS — Z1231 Encounter for screening mammogram for malignant neoplasm of breast: Secondary | ICD-10-CM | POA: Diagnosis not present

## 2021-01-15 DIAGNOSIS — Z13820 Encounter for screening for osteoporosis: Secondary | ICD-10-CM | POA: Diagnosis not present

## 2021-01-15 DIAGNOSIS — Z79890 Hormone replacement therapy (postmenopausal): Secondary | ICD-10-CM | POA: Diagnosis not present

## 2021-01-15 DIAGNOSIS — Z7689 Persons encountering health services in other specified circumstances: Secondary | ICD-10-CM | POA: Diagnosis not present

## 2021-01-15 DIAGNOSIS — A609 Anogenital herpesviral infection, unspecified: Secondary | ICD-10-CM | POA: Diagnosis not present

## 2021-01-15 DIAGNOSIS — Z6820 Body mass index (BMI) 20.0-20.9, adult: Secondary | ICD-10-CM | POA: Diagnosis not present

## 2021-01-15 DIAGNOSIS — Z01419 Encounter for gynecological examination (general) (routine) without abnormal findings: Secondary | ICD-10-CM | POA: Diagnosis not present

## 2021-01-15 DIAGNOSIS — Z01411 Encounter for gynecological examination (general) (routine) with abnormal findings: Secondary | ICD-10-CM | POA: Diagnosis not present

## 2021-01-15 DIAGNOSIS — Z124 Encounter for screening for malignant neoplasm of cervix: Secondary | ICD-10-CM | POA: Diagnosis not present

## 2021-01-19 ENCOUNTER — Other Ambulatory Visit: Payer: Self-pay | Admitting: Obstetrics

## 2021-01-19 DIAGNOSIS — Z13820 Encounter for screening for osteoporosis: Secondary | ICD-10-CM

## 2021-01-22 ENCOUNTER — Ambulatory Visit: Payer: PPO | Admitting: Orthopaedic Surgery

## 2021-01-24 DIAGNOSIS — D225 Melanocytic nevi of trunk: Secondary | ICD-10-CM | POA: Diagnosis not present

## 2021-01-24 DIAGNOSIS — L578 Other skin changes due to chronic exposure to nonionizing radiation: Secondary | ICD-10-CM | POA: Diagnosis not present

## 2021-01-24 DIAGNOSIS — D1801 Hemangioma of skin and subcutaneous tissue: Secondary | ICD-10-CM | POA: Diagnosis not present

## 2021-01-24 DIAGNOSIS — L814 Other melanin hyperpigmentation: Secondary | ICD-10-CM | POA: Diagnosis not present

## 2021-01-24 DIAGNOSIS — L821 Other seborrheic keratosis: Secondary | ICD-10-CM | POA: Diagnosis not present

## 2021-01-24 DIAGNOSIS — D2271 Melanocytic nevi of right lower limb, including hip: Secondary | ICD-10-CM | POA: Diagnosis not present

## 2021-01-25 DIAGNOSIS — E039 Hypothyroidism, unspecified: Secondary | ICD-10-CM | POA: Diagnosis not present

## 2021-01-25 DIAGNOSIS — Z1211 Encounter for screening for malignant neoplasm of colon: Secondary | ICD-10-CM | POA: Diagnosis not present

## 2021-01-25 DIAGNOSIS — Z789 Other specified health status: Secondary | ICD-10-CM | POA: Diagnosis not present

## 2021-01-25 DIAGNOSIS — Z0000 Encounter for general adult medical examination without abnormal findings: Secondary | ICD-10-CM | POA: Diagnosis not present

## 2021-01-25 DIAGNOSIS — E7800 Pure hypercholesterolemia, unspecified: Secondary | ICD-10-CM | POA: Diagnosis not present

## 2021-01-25 DIAGNOSIS — Z1159 Encounter for screening for other viral diseases: Secondary | ICD-10-CM | POA: Diagnosis not present

## 2021-01-25 DIAGNOSIS — Z23 Encounter for immunization: Secondary | ICD-10-CM | POA: Diagnosis not present

## 2021-01-25 DIAGNOSIS — D649 Anemia, unspecified: Secondary | ICD-10-CM | POA: Diagnosis not present

## 2021-01-29 ENCOUNTER — Ambulatory Visit (INDEPENDENT_AMBULATORY_CARE_PROVIDER_SITE_OTHER): Payer: PPO

## 2021-01-29 ENCOUNTER — Ambulatory Visit: Payer: PPO | Admitting: Orthopaedic Surgery

## 2021-01-29 ENCOUNTER — Encounter: Payer: Self-pay | Admitting: Orthopaedic Surgery

## 2021-01-29 DIAGNOSIS — Z96642 Presence of left artificial hip joint: Secondary | ICD-10-CM

## 2021-01-29 NOTE — Progress Notes (Signed)
The patient is a 70 year old female who is 7 months status post a left total hip arthroplasty.  She is actually a runner and has been running about 3 days a week and interval running as well.  She says the hip is tight and she does have some pain in the groin running.  I actually talked her in length letting her know that I use it on advocate running in the first 6 months after total joint replacement and is not good to be a daily runner anyway due to wear on the bearing surface.  Her left operative hip moves smoothly and fluidly.  Her leg lengths are equal.  Her hip is stable.  An AP pelvis and lateral left hip shows a well-seated total hip arthroplasty with no complicating features.  Her right hip appears normal.  At this point follow-up can be as needed.  She understands it can take a year or more for this to adjust to her body and think should improve with time.  If there are any issues at all with his hip she knows to let us know.  All questions and concerns were answered and addressed.

## 2021-02-09 DIAGNOSIS — Z9229 Personal history of other drug therapy: Secondary | ICD-10-CM | POA: Diagnosis not present

## 2021-02-09 DIAGNOSIS — Z79890 Hormone replacement therapy (postmenopausal): Secondary | ICD-10-CM | POA: Diagnosis not present

## 2021-04-16 ENCOUNTER — Other Ambulatory Visit: Payer: Self-pay | Admitting: Family Medicine

## 2021-04-16 ENCOUNTER — Other Ambulatory Visit: Payer: Self-pay

## 2021-04-16 DIAGNOSIS — M5416 Radiculopathy, lumbar region: Secondary | ICD-10-CM

## 2021-04-16 NOTE — Progress Notes (Signed)
Pt is asking for repeat lumbar ESI order be placed.

## 2021-04-27 ENCOUNTER — Ambulatory Visit
Admission: RE | Admit: 2021-04-27 | Discharge: 2021-04-27 | Disposition: A | Discharge status: Home / Self Care | Payer: PPO | Source: Ambulatory Visit | Attending: Family Medicine | Admitting: Family Medicine

## 2021-04-27 ENCOUNTER — Other Ambulatory Visit: Payer: Self-pay

## 2021-04-27 DIAGNOSIS — M5416 Radiculopathy, lumbar region: Secondary | ICD-10-CM

## 2021-04-27 MED ORDER — IOPAMIDOL (ISOVUE-M 200) INJECTION 41%: 1.0000 mL | Freq: Once | INTRAMUSCULAR | Status: DC

## 2021-04-27 MED ORDER — METHYLPREDNISOLONE ACETATE 40 MG/ML INJ SUSP (RADIOLOG: 80.0000 mg | Freq: Once | INTRAMUSCULAR | Status: DC

## 2021-04-27 NOTE — Discharge Instructions (Signed)

## 2021-06-26 ENCOUNTER — Ambulatory Visit: Payer: PPO | Admitting: Sports Medicine

## 2021-06-28 ENCOUNTER — Ambulatory Visit (INDEPENDENT_AMBULATORY_CARE_PROVIDER_SITE_OTHER): Payer: No Typology Code available for payment source | Admitting: Sports Medicine

## 2021-06-28 VITALS — BP 120/78 | Ht 65.0 in | Wt 120.0 lb

## 2021-06-28 DIAGNOSIS — M25552 Pain in left hip: Secondary | ICD-10-CM | POA: Diagnosis not present

## 2021-06-28 NOTE — Progress Notes (Signed)
° °  Subjective:    Patient ID: Tonya Ingram, female    DOB: Mar 03, 1951, 71 y.o.   MRN: [id number]  HPI chief complaint: Left hip pain  Tonya Ingram is a very pleasant 71 year old female that comes in today complaining of a few weeks of posterior left hip pain.  She is status post left hip total hip arthroplasty done 1 year ago by Dr. Ninfa Linden.  She has done well postoperatively.  She also has a history of spinal stenosis and has had 2 lumbar ESI's but her current pain is different in nature than what she has experienced with that.  She localizes her pain to the proximal hamstring area.  She denies groin pain.  Up until recently she was still running.  Now she is having pain with walking, sitting, and stretching.  She is also having some lateral hip pain.    Review of Systems As above    Objective:   Physical Exam  Well-developed, fit appearing.  No acute distress  Left hip: Smooth painless hip range of motion with a negative logroll.  She is tender to palpation along the proximal hamstring tendon.  Reproducible pain with hamstring stretching.  Neurovascularly intact distally.      Assessment & Plan:   Left hip pain secondary to proximal hamstring tendinopathy Status post left hip total hip arthroplasty-doing well History of spinal stenosis  I think the patient would benefit from formal physical therapy.  She had limited PT after her hip replacement so I think PT focusing on strengthening the hip is important.  I will set her up with Angelia Mould at [place] and she will follow-up with me 6 weeks after starting PT.  In the meantime, we will give her some beginner [place] exercises to start doing on her own while she awaits her first PT visit.  This note was dictated using Dragon naturally speaking software and may contain errors in syntax, spelling, or content which have not been identified prior to signing this note.

## 2021-07-06 ENCOUNTER — Other Ambulatory Visit: Payer: Self-pay | Admitting: Family Medicine

## 2021-07-06 ENCOUNTER — Other Ambulatory Visit: Payer: Self-pay

## 2021-07-06 DIAGNOSIS — M5416 Radiculopathy, lumbar region: Secondary | ICD-10-CM

## 2021-07-06 NOTE — Progress Notes (Signed)
Pt called asking if another ESI order can be placed.

## 2021-07-10 ENCOUNTER — Ambulatory Visit
Admission: RE | Admit: 2021-07-10 | Discharge: 2021-07-10 | Disposition: A | Discharge status: Home / Self Care | Payer: No Typology Code available for payment source | Source: Ambulatory Visit | Attending: Family Medicine | Admitting: Family Medicine

## 2021-07-10 ENCOUNTER — Other Ambulatory Visit: Payer: Self-pay

## 2021-07-10 DIAGNOSIS — M47817 Spondylosis without myelopathy or radiculopathy, lumbosacral region: Secondary | ICD-10-CM | POA: Diagnosis not present

## 2021-07-10 DIAGNOSIS — M5416 Radiculopathy, lumbar region: Secondary | ICD-10-CM

## 2021-07-10 DIAGNOSIS — M48061 Spinal stenosis, lumbar region without neurogenic claudication: Secondary | ICD-10-CM | POA: Diagnosis not present

## 2021-07-10 MED ORDER — IOPAMIDOL (ISOVUE-M 200) INJECTION 41%
1.0000 mL | Freq: Once | INTRAMUSCULAR | Status: AC
  Administered 2021-07-10: 1 mL via EPIDURAL

## 2021-07-10 MED ORDER — METHYLPREDNISOLONE ACETATE 40 MG/ML INJ SUSP (RADIOLOG
80.0000 mg | Freq: Once | INTRAMUSCULAR | Status: AC
  Administered 2021-07-10: 80 mg via EPIDURAL

## 2021-07-10 NOTE — Discharge Instructions (Signed)

## 2021-07-16 ENCOUNTER — Ambulatory Visit: Payer: No Typology Code available for payment source | Attending: Sports Medicine | Admitting: Physical Therapy

## 2021-07-16 ENCOUNTER — Encounter: Payer: Self-pay | Admitting: Physical Therapy

## 2021-07-16 ENCOUNTER — Other Ambulatory Visit: Payer: Self-pay

## 2021-07-16 DIAGNOSIS — M25552 Pain in left hip: Secondary | ICD-10-CM | POA: Insufficient documentation

## 2021-07-16 DIAGNOSIS — R262 Difficulty in walking, not elsewhere classified: Secondary | ICD-10-CM | POA: Insufficient documentation

## 2021-07-16 NOTE — Patient Instructions (Signed)
Access Code: [email or web address] URL: [email or web address] Date: 07/16/2021 Prepared by: Lum Babe  Exercises Seated Hamstring Stretch with Chair - 2 x daily - 7 x weekly - 1 sets - 3 reps - 30 hold Supine Hamstring Stretch - 2 x daily - 7 x weekly - 1 sets - 3 reps - 30 hold Supine Piriformis Stretch Pulling Heel to Hip - 2 x daily - 7 x weekly - 1 sets - 3 reps - 30 hold Supine Quadriceps Stretch with Strap on Table - 2 x daily - 7 x weekly - 1 sets - 3 reps - 30 hold

## 2021-07-16 NOTE — Therapy (Signed)
[place]. [place], [place], [address] Phone: [phone]   Fax:  [phone]  Physical Therapy Evaluation  Patient Details  Name: Tonya Ingram MRN: [id number] Date of Birth: May 20, 1951 Referring Provider (PT): Rudean Hitt Date: 07/16/2021   PT End of Session - 07/16/21 0838     Visit Number 1    Date for PT Re-Evaluation 10/14/21    PT Start Time 0800    PT Stop Time 0838    PT Time Calculation (min) 38 min    Activity Tolerance Patient tolerated treatment well    Behavior During Therapy [facility] for tasks assessed/performed             Past Medical History:  Diagnosis Date   Arthritis    Depression    GERD (gastroesophageal reflux disease)    Hypothyroidism    Raynaud's disease    Thyroid disease     Past Surgical History:  Procedure Laterality Date   THYROIDECTOMY     TOTAL HIP ARTHROPLASTY Left 06/30/2020   Procedure: LEFT TOTAL HIP ARTHROPLASTY ANTERIOR APPROACH;  Surgeon: Mcarthur Rossetti, MD;  Location: [facility];  Service: Orthopedics;  Laterality: Left;   TUBAL LIGATION      There were no vitals filed for this visit.    Subjective Assessment - 07/16/21 0805     Subjective Patient underwent a left anterior hip replacement 06/30/20, she reports that she rehabed well but reports that she went back to running and feels that she may have returned too soon,, she comes in with left hip pain.,  She reports mostly left HS origin pain and some hip flexor pain, x-rays negative.    Patient Stated Goals have no pain, jog, run    Currently in Pain? Yes    Pain Score 2     Pain Location Hip    Pain Orientation Left    Pain Descriptors / Indicators Tender;Sore;Tightness    Pain Type Acute pain    Pain Radiating Towards Does have lumbar stenosis with some left leg pain    Pain Onset More than a month ago    Pain Frequency Constant    Aggravating Factors  running, sitting on the left hip,  pain up to 8/10, some walking and running    Pain Relieving Factors injection helped, OTC pain meds, pain could be 1-2/10    Effect of Pain on Daily Activities some difficulty walking, running, stretching                [facility] PT Assessment - 07/16/21 0001       Assessment   Medical Diagnosis left hip pain    Referring Provider (PT) Draper    Onset Date/Surgical Date 06/15/21    Prior Therapy after the THA      Precautions   Precautions None      Balance Screen   Has the patient fallen in the past 6 months No    Has the patient had a decrease in activity level because of a fear of falling?  No    Is the patient reluctant to leave their home because of a fear of falling?  No      Home Environment   Additional Comments has stairs, does housework      Prior Function   Level of Independence Independent with basic ADLs    Vocation Part time employment    Vocation Requirements some lifting at [employer] 4 hour shifdt  Leisure running 3 miles 2-3 days a week, some weights in body pump class, Yoga 1x/week      Posture/Postural Control   Posture Comments decreased lordosis,      ROM / Strength   AROM / PROM / Strength AROM;Strength      AROM   Overall AROM Comments Lumbar ROM WFL's for flexion and extension, some limitation with side bending, Left hip ROM WFL's some pain      Strength   Overall Strength Comments left hip flexor 4-/5, other hip motions 4+/5 some pain. knee extension 4/5, left knee flexion 4-/5 with pain      Flexibility   Soft Tissue Assessment /Muscle Length yes    Hamstrings left 70 degrees pain    Quadriceps tight and tender    ITB tight    Piriformis tight      Palpation   Palpation comment tender int he left hip flexor and quad, very tender int he left Hs origin      Ambulation/Gait   Gait Comments mild small drop at left hipwith the left foot strike                        Objective measurements completed on examination: See  above findings.       [facility] Adult PT Treatment/Exercise - 07/16/21 0001       Modalities   Modalities Iontophoresis      Iontophoresis   Type of Iontophoresis Dexamethasone    Location left HS origin    Dose 71mA    Time 4 hour patch                       PT Short Term Goals - 07/16/21 0915       PT SHORT TERM GOAL #1   Title independent with initial HEP    Time 2    Period Weeks    Status New               PT Long Term Goals - 07/16/21 4098       PT LONG TERM GOAL #1   Title independent with advanced HEP    Time 12    Period Weeks    Status New      PT LONG TERM GOAL #2   Title report pain overall decreased 50%    Time 12    Period Weeks    Status New      PT LONG TERM GOAL #3   Title increse left hip flexor strength to 4+/5    Time 12    Period Weeks    Status New      PT LONG TERM GOAL #4   Title increase left HS SLR to 90 degress    Time 12    Period Weeks    Status New                    Plan - 07/16/21 0912     Clinical Impression Statement Patient underwent a left THA anterior approach January 2022.  she recovered well but went back to running (run/walk program), she feels that she may have over done it or went back too soon, she started having left hip pain, mostly HS origin, she does report spinal stenosis and a recent injection in the lumbar area.  She does have left hip flexion weakness, pain with MMT of the left HS, she is tight in the  HS 70 degree SLR with pain, she is tight in the piriformis and the hip flexor and quad.  Mild drop of th eleft hip at heelstrike, mid stance    Stability/Clinical Decision Making Stable/Uncomplicated    Clinical Decision Making Low    Rehab Potential Good    PT Frequency 2x / week    PT Duration 12 weeks    PT Treatment/Interventions ADLs/Self Care Home Management;Electrical Stimulation;Cryotherapy;Iontophoresis 4mg /ml Dexamethasone;Moist Heat;Traction;Ultrasound;Gait  training;Neuromuscular re-education;Balance training;Therapeutic exercise;Therapeutic activities;Functional mobility training;Stair training;Patient/family education;Manual techniques;Dry needling    PT Next Visit Plan will start with the flexibility, see how ionto did, then ease into strength    Consulted and Agree with Plan of Care Patient             Patient will benefit from skilled therapeutic intervention in order to improve the following deficits and impairments:  Abnormal gait, Decreased range of motion, Difficulty walking, Increased muscle spasms, Pain, Impaired flexibility, Decreased strength, Decreased mobility, Postural dysfunction  Visit Diagnosis: Pain in left hip - Plan: PT plan of care cert/re-cert  Difficulty in walking, not elsewhere classified - Plan: PT plan of care cert/re-cert     Problem List Patient Active Problem List   Diagnosis Date Noted   Status post total replacement of left hip 06/30/2020   Unilateral primary osteoarthritis, left hip 04/03/2020    Sumner Boast, PT 07/16/2021, 9:18 AM  [place]. [place], [place], [address] Phone: [phone]   Fax:  [phone]  Name: Tonya Ingram MRN: [id number] Date of Birth: 26-Sep-1950

## 2021-07-19 ENCOUNTER — Ambulatory Visit: Payer: No Typology Code available for payment source | Admitting: Physical Therapy

## 2021-07-19 ENCOUNTER — Other Ambulatory Visit: Payer: Self-pay

## 2021-07-19 DIAGNOSIS — M25552 Pain in left hip: Secondary | ICD-10-CM | POA: Diagnosis not present

## 2021-07-19 DIAGNOSIS — R262 Difficulty in walking, not elsewhere classified: Secondary | ICD-10-CM

## 2021-07-19 NOTE — Therapy (Signed)
[place]. [place], [place], [address] Phone: [phone]   Fax:  [phone]  Physical Therapy Treatment  Patient Details  Name: Tonya Ingram MRN: [id number] Date of Birth: 03-24-1951 Referring Provider (PT): Rudean Hitt Date: 07/19/2021   PT End of Session - 07/19/21 0918     Visit Number 2    Date for PT Re-Evaluation 10/14/21    PT Start Time 3818    PT Stop Time 0935    PT Time Calculation (min) 52 min             Past Medical History:  Diagnosis Date   Arthritis    Depression    GERD (gastroesophageal reflux disease)    Hypothyroidism    Raynaud's disease    Thyroid disease     Past Surgical History:  Procedure Laterality Date   THYROIDECTOMY     TOTAL HIP ARTHROPLASTY Left 06/30/2020   Procedure: LEFT TOTAL HIP ARTHROPLASTY ANTERIOR APPROACH;  Surgeon: Mcarthur Rossetti, MD;  [facility];  Service: Orthopedics;  Laterality: Left;   TUBAL LIGATION      There were no vitals filed for this visit.   Subjective Assessment - 07/19/21 0844     Subjective have not ran in 4 monthes. pain better now. stretches and ex this morning....i can feel it but okay. walked thsi morning and did well.patch was good and helped    Currently in Pain? No/denies                               [facility] Adult PT Treatment/Exercise - 07/19/21 0001       Exercises   Exercises Knee/Hip;Lumbar      Lumbar Exercises: Supine   Bridge 10 reps;Compliant   then 10 with feet on ball. 10 with HS curl   Single Leg Bridge Non-compliant;10 reps;2 seconds      Knee/Hip Exercises: Aerobic   Nustep L 5 5 min LE      Knee/Hip Exercises: Machines for Strengthening   Cybex Knee Extension 15# 2 sets 10    Cybex Knee Flexion 10# 10 SL Left, 25# 10 x      Knee/Hip Exercises: Standing   Other Standing Knee Exercises BIL hip 3 way red tband 2 sets 10      Knee/Hip Exercises: Supine    Straight Leg Raises Strengthening;Left;10 reps   with abd 3#   Straight Leg Raises Limitations 3# CW and CCW circles      Knee/Hip Exercises: Sidelying   Hip ABduction Strengthening;Left;2 sets;10 reps    Hip ABduction Limitations 3#      Knee/Hip Exercises: Prone   Hip Extension Strengthening;Left;2 sets;10 reps   3#     Modalities   Modalities Iontophoresis      Iontophoresis   Type of Iontophoresis Dexamethasone    Location left HS origin    Dose 56m    Time 4 hour patch      Manual Therapy   Manual Therapy Soft tissue mobilization;Passive ROM    Soft tissue mobilization left buttock and HS with PROM    Passive ROM ITB and HS                     PT Education - 07/19/21 0856     Education Details hip 3 way red tband and bridges    Person(s) Educated Patient  Methods Explanation;Demonstration;Handout    Comprehension Verbalized understanding;Returned demonstration              PT Short Term Goals - 07/19/21 0919       PT SHORT TERM GOAL #1   Title independent with initial HEP    Status Achieved               PT Long Term Goals - 07/16/21 0916       PT LONG TERM GOAL #1   Title independent with advanced HEP    Time 12    Period Weeks    Status New      PT LONG TERM GOAL #2   Title report pain overall decreased 50%    Time 12    Period Weeks    Status New      PT LONG TERM GOAL #3   Title increse left hip flexor strength to 4+/5    Time 12    Period Weeks    Status New      PT LONG TERM GOAL #4   Title increase left HS SLR to 90 degress    Time 12    Period Weeks    Status New                   Plan - 07/19/21 0919     Clinical Impression Statement STG met, added to HEP. Progressed isolated hip ex with fatigue and minimal discomfort but tolerable. STW with PROM to left hip. relief with ionto so did another patch    PT Treatment/Interventions ADLs/Self Care Home Management;Electrical  Stimulation;Cryotherapy;Iontophoresis 60m/ml Dexamethasone;Moist Heat;Traction;Ultrasound;Gait training;Neuromuscular re-education;Balance training;Therapeutic exercise;Therapeutic activities;Functional mobility training;Stair training;Patient/family education;Manual techniques;Dry needling    PT Next Visit Plan assess and progress             Patient will benefit from skilled therapeutic intervention in order to improve the following deficits and impairments:  Abnormal gait, Decreased range of motion, Difficulty walking, Increased muscle spasms, Pain, Impaired flexibility, Decreased strength, Decreased mobility, Postural dysfunction  Visit Diagnosis: Pain in left hip  Difficulty in walking, not elsewhere classified     Problem List Patient Active Problem List   Diagnosis Date Noted   Status post total replacement of left hip 06/30/2020   Unilateral primary osteoarthritis, left hip 04/03/2020    Bee Hammerschmidt,[name], PTA 07/19/2021, 9:21 AM  [place] [address]Phone: [phone]  Fax:  [phone] Name: Tonya LapageMRN: [id number] of Birth: [birth year]

## 2021-07-20 ENCOUNTER — Ambulatory Visit
Admission: RE | Admit: 2021-07-20 | Discharge: 2021-07-20 | Disposition: A | Discharge status: Home / Self Care | Payer: No Typology Code available for payment source | Source: Ambulatory Visit | Attending: Obstetrics | Admitting: Obstetrics

## 2021-07-20 DIAGNOSIS — Z13820 Encounter for screening for osteoporosis: Secondary | ICD-10-CM

## 2021-07-20 DIAGNOSIS — M8589 Other specified disorders of bone density and structure, multiple sites: Secondary | ICD-10-CM | POA: Diagnosis not present

## 2021-07-20 DIAGNOSIS — Z780 Asymptomatic menopausal state: Secondary | ICD-10-CM | POA: Diagnosis not present

## 2021-07-24 ENCOUNTER — Ambulatory Visit: Payer: No Typology Code available for payment source | Admitting: Physical Therapy

## 2021-07-24 ENCOUNTER — Other Ambulatory Visit: Payer: Self-pay

## 2021-07-24 DIAGNOSIS — M25552 Pain in left hip: Secondary | ICD-10-CM | POA: Diagnosis not present

## 2021-07-24 DIAGNOSIS — R262 Difficulty in walking, not elsewhere classified: Secondary | ICD-10-CM

## 2021-07-24 NOTE — Therapy (Signed)
[place]. [place], [place], [address] Phone: [phone]   Fax:  [phone]  Physical Therapy Treatment  Patient Details  Name: Tonya Ingram MRN: [id number] Date of Birth: 1951-04-01 Referring Provider (PT): Rudean Hitt Date: 07/24/2021   PT End of Session - 07/24/21 1400     Visit Number 3    Date for PT Re-Evaluation 10/14/21    PT Start Time 1315    PT Stop Time 1405    PT Time Calculation (min) 50 min             Past Medical History:  Diagnosis Date   Arthritis    Depression    GERD (gastroesophageal reflux disease)    Hypothyroidism    Raynaud's disease    Thyroid disease     Past Surgical History:  Procedure Laterality Date   THYROIDECTOMY     TOTAL HIP ARTHROPLASTY Left 06/30/2020   Procedure: LEFT TOTAL HIP ARTHROPLASTY ANTERIOR APPROACH;  Surgeon: Mcarthur Rossetti, MD;  [facility];  Service: Orthopedics;  Laterality: Left;   TUBAL LIGATION      There were no vitals filed for this visit.   Subjective Assessment - 07/24/21 1317     Subjective felt pretty good after last session, patch is great    Currently in Pain? No/denies                               [facility] Adult PT Treatment/Exercise - 07/24/21 0001       Knee/Hip Exercises: Aerobic   Nustep L 6 5 min LE only      Knee/Hip Exercises: Machines for Strengthening   Cybex Leg Press 40# 3 sets 10- feet 3 way for hips   20# LLE only 10 x     Knee/Hip Exercises: Standing   Lateral Step Up Both;15 reps;Hand Hold: 1;Step Height: 6"   opp leg abd   Forward Step Up Both;15 reps;Step Height: 6";Hand Hold: 1   opp leg ext   Walking with Sports Cord 40# 5x fwd, 5 x back and 3 x each side    Other Standing Knee Exercises fitter 2 bands 15 x 3 way BIL      Knee/Hip Exercises: Supine   Straight Leg Raises Strengthening;Left;10 reps;2 sets   with abd   Straight Leg Raises Limitations 3# plus  CW and CCW      Knee/Hip Exercises: Sidelying   Hip ABduction Strengthening;Left;2 sets;10 reps    Hip ABduction Limitations 3#      Knee/Hip Exercises: Prone   Hamstring Curl 2 sets;10 reps   3#   Hip Extension Strengthening;Left;2 sets;10 reps   bent knee and SL 2 sets each 3#     Iontophoresis   Type of Iontophoresis Dexamethasone    Location left HS origin    Dose 76mA    Time 4 hour patch      Manual Therapy   Manual Therapy Soft tissue mobilization;Passive ROM    Soft tissue mobilization left buttock and HS with PROM                       PT Short Term Goals - 07/19/21 0919       PT SHORT TERM GOAL #1   Title independent with initial HEP    Status Achieved  PT Long Term Goals - 07/16/21 0916       PT LONG TERM GOAL #1   Title independent with advanced HEP    Time 12    Period Weeks    Status New      PT LONG TERM GOAL #2   Title report pain overall decreased 50%    Time 12    Period Weeks    Status New      PT LONG TERM GOAL #3   Title increse left hip flexor strength to 4+/5    Time 12    Period Weeks    Status New      PT LONG TERM GOAL #4   Title increase left HS SLR to 90 degress    Time 12    Period Weeks    Status New                   Plan - 07/24/21 1400     Clinical Impression Statement progressed ex and noted weakness in left vs rt with muscle isolation, cuing for compensation. educ on ex to do at gym. STW and Passive stretching with ionto    PT Treatment/Interventions ADLs/Self Care Home Management;Electrical Stimulation;Cryotherapy;Iontophoresis 4mg /ml Dexamethasone;Moist Heat;Traction;Ultrasound;Gait training;Neuromuscular re-education;Balance training;Therapeutic exercise;Therapeutic activities;Functional mobility training;Stair training;Patient/family education;Manual techniques;Dry needling    PT Next Visit Plan assess and progress             Patient will benefit from skilled  therapeutic intervention in order to improve the following deficits and impairments:  Abnormal gait, Decreased range of motion, Difficulty walking, Increased muscle spasms, Pain, Impaired flexibility, Decreased strength, Decreased mobility, Postural dysfunction  Visit Diagnosis: Pain in left hip  Difficulty in walking, not elsewhere classified     Problem List Patient Active Problem List   Diagnosis Date Noted   Status post total replacement of left hip 06/30/2020   Unilateral primary osteoarthritis, left hip 04/03/2020    Tonya Ingram,[name], PTA 07/24/2021, 2:02 PM  [place]. [place], [place], [address] Phone: [phone]   Fax:  [phone]  Name: Tonya Ingram MRN: [id number] Date of Birth: Apr 14, 1951

## 2021-07-27 ENCOUNTER — Other Ambulatory Visit: Payer: Self-pay

## 2021-07-27 ENCOUNTER — Ambulatory Visit: Payer: No Typology Code available for payment source | Attending: Sports Medicine | Admitting: Physical Therapy

## 2021-07-27 DIAGNOSIS — M25552 Pain in left hip: Secondary | ICD-10-CM | POA: Insufficient documentation

## 2021-07-27 DIAGNOSIS — R262 Difficulty in walking, not elsewhere classified: Secondary | ICD-10-CM | POA: Insufficient documentation

## 2021-07-27 NOTE — Therapy (Signed)
[place]. [place], [place], [address] Phone: [phone]   Fax:  [phone]  Physical Therapy Treatment  Patient Details  Name: Tonya Ingram MRN: [id number] Date of Birth: Jun 10, 1951 Referring Provider (PT): Rudean Hitt Date: 07/27/2021   PT End of Session - 07/27/21 0959     Visit Number 4    Date for PT Re-Evaluation 10/14/21    PT Start Time 0922    PT Stop Time 1010    PT Time Calculation (min) 48 min             Past Medical History:  Diagnosis Date   Arthritis    Depression    GERD (gastroesophageal reflux disease)    Hypothyroidism    Raynaud's disease    Thyroid disease     Past Surgical History:  Procedure Laterality Date   THYROIDECTOMY     TOTAL HIP ARTHROPLASTY Left 06/30/2020   Procedure: LEFT TOTAL HIP ARTHROPLASTY ANTERIOR APPROACH;  Surgeon: Mcarthur Rossetti, MD;  [facility];  Service: Orthopedics;  Laterality: Left;   TUBAL LIGATION      There were no vitals filed for this visit.   Subjective Assessment - 07/27/21 0924     Subjective overall 50% better. I think I am good to just try and ex on my own                               [facility] Adult PT Treatment/Exercise - 07/27/21 0001       Knee/Hip Exercises: Aerobic   Tread Mill 2 min each side   lft leg push/pull 20 each   Nustep L 6 4 min LEonly      Knee/Hip Exercises: Standing   Lateral Step Up Both;15 reps;Hand Hold: 1;Step Height: 6"   BOSU   Forward Step Up Both;15 reps;Step Height: 6";Hand Hold: 1   BOS   Other Standing Knee Exercises BOSU squats 15      Knee/Hip Exercises: Supine   Straight Leg Raises Strengthening;Left;10 reps;2 sets   with abd   Straight Leg Raises Limitations 3# plus CW and CCW      Knee/Hip Exercises: Sidelying   Hip ABduction Strengthening;Left;2 sets;10 reps    Hip ABduction Limitations 3      Knee/Hip Exercises: Prone   Hamstring Curl 2  sets;10 reps   3#   Hip Extension Strengthening;Left;2 sets;10 reps   3#     Iontophoresis   Type of Iontophoresis Dexamethasone    Location left HS origin    Dose 13m    Time 4 hour patch      Manual Therapy   Manual Therapy Soft tissue mobilization;Passive ROM    Soft tissue mobilization left buttock and HS with PROM                     PT Education - 07/27/21 0948     Education Details [id number] with SL variations, TM and BOSU work    [employer] Educated Patient    Methods Explanation;Demonstration;Handout    Comprehension Verbalized understanding;Returned demonstration              PT Short Term Goals - 07/19/21 0919       PT SHORT TERM GOAL #1   Title independent with initial HEP    Status Achieved  PT Long Term Goals - 07/27/21 0950       PT LONG TERM GOAL #1   Title independent with advanced HEP    Status Achieved      PT LONG TERM GOAL #2   Title report pain overall decreased 50%    Status Achieved      PT LONG TERM GOAL #3   Title increse left hip flexor strength to 4+/5    Status Partially Met      PT LONG TERM GOAL #4   Title increase left HS SLR to 90 degress    Status Achieved                   Plan - 07/27/21 0958     Clinical Impression Statement all goals met except strength. pt very pleaed and feels with advanced HEP can try on her own. issued add'l HEP and educ on gym ex    PT Treatment/Interventions ADLs/Self Care Home Management;Electrical Stimulation;Cryotherapy;Iontophoresis 3m/ml Dexamethasone;Moist Heat;Traction;Ultrasound;Gait training;Neuromuscular re-education;Balance training;Therapeutic exercise;Therapeutic activities;Functional mobility training;Stair training;Patient/family education;Manual techniques;Dry needling    PT Next Visit Plan HOLD             Patient will benefit from skilled therapeutic intervention in order to improve the following deficits and impairments:  Abnormal  gait, Decreased range of motion, Difficulty walking, Increased muscle spasms, Pain, Impaired flexibility, Decreased strength, Decreased mobility, Postural dysfunction  Visit Diagnosis: Pain in left hip  Difficulty in walking, not elsewhere classified     Problem List Patient Active Problem List   Diagnosis Date Noted   Status post total replacement of left hip 06/30/2020   Unilateral primary osteoarthritis, left hip 04/03/2020    Jeane Cashatt,[name], PTA 07/27/2021, 9:59 AM  [place] [address]Phone: [phone]  Fax:  [phone] Name: KHaliyah FrymanMRN: [id number] of Birth: 1December 17, 1952

## 2021-09-04 ENCOUNTER — Other Ambulatory Visit: Payer: Self-pay

## 2021-09-04 ENCOUNTER — Other Ambulatory Visit: Payer: Self-pay | Admitting: Family Medicine

## 2021-09-04 DIAGNOSIS — M5416 Radiculopathy, lumbar region: Secondary | ICD-10-CM

## 2021-09-04 NOTE — Progress Notes (Signed)
Pt called asking for repeat lumbar ESI order. ? ?Order placed. Pt instructed to f/u with [place] a week or so after the injection for follow up. ?

## 2021-09-14 ENCOUNTER — Other Ambulatory Visit: Payer: Self-pay | Admitting: Family Medicine

## 2021-09-14 ENCOUNTER — Ambulatory Visit
Admission: RE | Admit: 2021-09-14 | Discharge: 2021-09-14 | Disposition: A | Discharge status: Home / Self Care | Payer: No Typology Code available for payment source | Source: Ambulatory Visit | Attending: Family Medicine | Admitting: Family Medicine

## 2021-09-14 DIAGNOSIS — M5416 Radiculopathy, lumbar region: Secondary | ICD-10-CM | POA: Diagnosis not present

## 2021-09-14 MED ORDER — IOPAMIDOL (ISOVUE-M 200) INJECTION 41%
1.0000 mL | Freq: Once | INTRAMUSCULAR | Status: AC
  Administered 2021-09-14: 1 mL via EPIDURAL

## 2021-09-14 MED ORDER — METHYLPREDNISOLONE ACETATE 40 MG/ML INJ SUSP (RADIOLOG: 80.0000 mg | Freq: Once | INTRAMUSCULAR | Status: DC

## 2021-09-14 NOTE — Discharge Instructions (Signed)

## 2021-09-18 ENCOUNTER — Encounter: Payer: Self-pay | Admitting: Sports Medicine

## 2021-09-18 ENCOUNTER — Ambulatory Visit (INDEPENDENT_AMBULATORY_CARE_PROVIDER_SITE_OTHER): Payer: No Typology Code available for payment source | Admitting: Sports Medicine

## 2021-09-18 VITALS — BP 124/72 | Ht 65.0 in | Wt 120.0 lb

## 2021-09-18 DIAGNOSIS — M48061 Spinal stenosis, lumbar region without neurogenic claudication: Secondary | ICD-10-CM | POA: Diagnosis not present

## 2021-09-19 NOTE — Progress Notes (Signed)
Patient ID: Tonya Ingram, female   DOB: 04-11-1951, 71 y.o.   MRN: [id number] ? ?Tonya Ingram presents today for follow-up.  She is status post lumbar ESI on March 24.  She is about 40% better.  She has a well-documented history of severe lumbar spinal stenosis.  An MRI of her lumbar spine in July 2020 to confirm this.  She has gotten intermittent relief with epidural injections in the past but she has noticed that each injection provides less relief.  She is also following up on posterior left hip pain secondary to proximal hamstring tendinopathy.  She has done physical therapy and is doing well in this regard.  Physical exam was not repeated today.  We primarily talked about her severe spinal stenosis.  I would like to go ahead and refer her to Dr. Ellene Route.  She is in agreement with that plan.  For her proximal left hamstring tendinopathy she will continue with her home exercises.  Follow-up with me as needed. ? ?This note was dictated using Dragon naturally speaking software and may contain errors in syntax, spelling, or content which have not been identified prior to signing this note.  ?

## 2021-10-10 ENCOUNTER — Ambulatory Visit (INDEPENDENT_AMBULATORY_CARE_PROVIDER_SITE_OTHER): Payer: No Typology Code available for payment source | Admitting: Sports Medicine

## 2021-10-10 ENCOUNTER — Ambulatory Visit: Payer: Self-pay

## 2021-10-10 VITALS — BP 123/51 | Ht 65.0 in | Wt 120.0 lb

## 2021-10-10 DIAGNOSIS — M25551 Pain in right hip: Secondary | ICD-10-CM

## 2021-10-10 DIAGNOSIS — M7061 Trochanteric bursitis, right hip: Secondary | ICD-10-CM

## 2021-10-10 MED ORDER — METHYLPREDNISOLONE ACETATE 80 MG/ML IJ SUSP
80.0000 mg | Freq: Once | INTRAMUSCULAR | Status: AC
  Administered 2021-10-10: 80 mg via INTRA_ARTICULAR

## 2021-10-10 NOTE — Progress Notes (Signed)
PCP: Caren Macadam, MD ? ?Subjective:  ? ?HPI: ?Tonya Ingram is a pleasant 71 y.o. female here for right lateral hip pain. Previously seen by Dr. Micheline Chapman and thought US-guided GT injection may be both diagnostic and therapeutic. She would like to proceed with this today. ? ?BP (!) 123/51   Ht '5\' 5"'$  (1.651 m)   Wt 120 lb (54.4 kg)   BMI 19.97 kg/m?  ? ? ?  06/28/2021  ?  8:51 AM 09/18/2021  ?  3:01 PM  ?[place] Adult Exercise  ?Frequency of aerobic exercise (# of days/week) 7 6  ?Average time in minutes 40 75  ?Frequency of strengthening activities (# of days/week) 6 6  ? ? ?   ? View : No data to display.  ?  ?  ?  ? ? ?    ?Objective:  ?Physical Exam: ? ?Gen: Well-appearing, in no acute distress; non-toxic ?CV: Regular Rate. Well-perfused. Warm.  ?Resp: Breathing unlabored on room air; no wheezing. ?Psych: Fluid speech in conversation; appropriate affect; normal thought process ?Neuro: Sensation intact throughout. No gross coordination deficits.  ?MSK:  ?- Right hip: + TTP over posterior aspect of greater trochanter.  Inspection of the skin demonstrates no overlying erythema, ecchymosis or swelling.  She has full range of motion about the hip.  Negative FADIR testing, some pain with FABER testing and resisted Abduction. NVI.  ?  ?Assessment & Plan:  ?1. Greater trochanteric bursitis, right hip ?2. Right hip pain ? ?Procedure: After informed written consent timeout was performed, patient was lying in lateral recumbent position on exam table. Using ultrasound guidance, the greater trochanter was identified. The area overlying right trochanteric bursa was then prepped with Betadine and alcohol swabs. Following sterile precautions, ultrasound was reapplied to visualize needle guidance with a 22-gauge 3.5" needle utilizing an in-plane approach to inject the bursa with 4:1 lidocaine: depomedrol '80mg'$ /mL. Delivery of the injectate was visualized into the region of hypoechoic fluid of the greater trochanteric bursa.  Patient tolerated procedure well without immediate complications.   ?  ? ? ?Plan: ?-Ultrasound-guided greater trochanteric injection performed today, patient tolerated well.  She had immediate relief of pain a few minutes following the injection. ?-May use ice and/or Tylenol/NSAIDs for any postinjection pain ?-May resume normal activity after 24 hours.  May follow-up with Dr. Micheline Chapman or myself as needed. ? ?Elba Barman, DO ?PGY-4, Sports Medicine Fellow ?[place] ? ?This note was dictated using Dragon naturally speaking software and may contain errors in syntax, spelling, or content which have not been identified prior to signing this note.  ? ?Addendum:  I was the preceptor for this visit and available for immediate consultation.  Karlton Lemon MD CAQSM ? ?

## 2021-10-31 ENCOUNTER — Other Ambulatory Visit: Payer: Self-pay | Admitting: Family Medicine

## 2021-10-31 ENCOUNTER — Other Ambulatory Visit: Payer: Self-pay

## 2021-10-31 DIAGNOSIS — M5416 Radiculopathy, lumbar region: Secondary | ICD-10-CM

## 2021-10-31 NOTE — Progress Notes (Signed)
Pt called asking for an order be placed for another ESI.  ? ?Order placed. ?

## 2021-11-02 IMAGING — XA Imaging study
2 series · 2 of 2 positions shown · non-contrast
Comparison: none

CLINICAL DATA: Lumbosacral spondylosis without myelopathy with
radiculopathy. Pain radiating down the left leg in an L5
distribution. No back pain. Severe spinal canal stenosis at L4-L5.

[Series 1: ortho standard · 1 of 1 slices shown (1 of 2)]
[im 1/1]
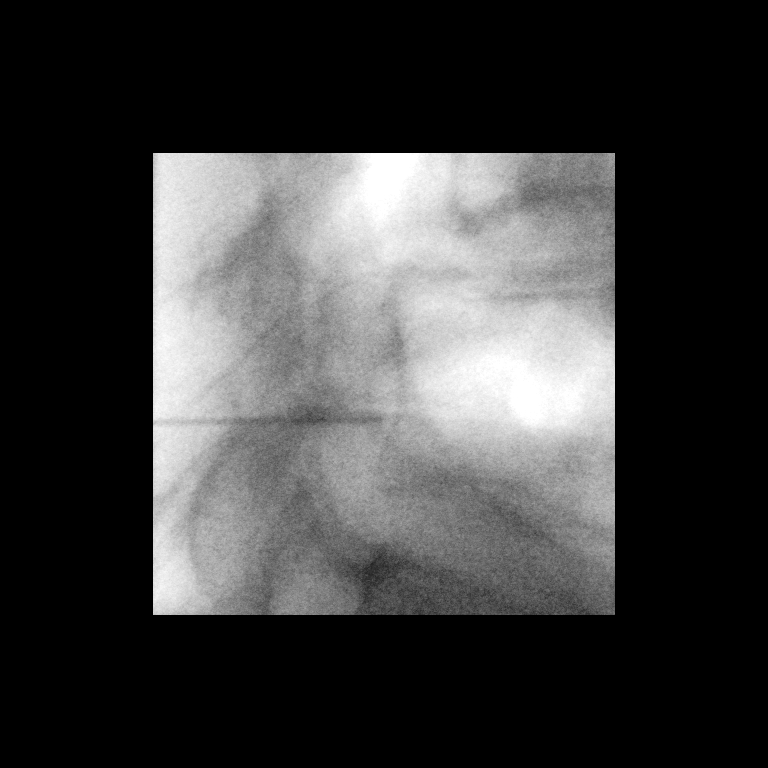

[Series 2: ortho standard · 1 of 1 slices shown (2 of 2)]
[im 1/1]
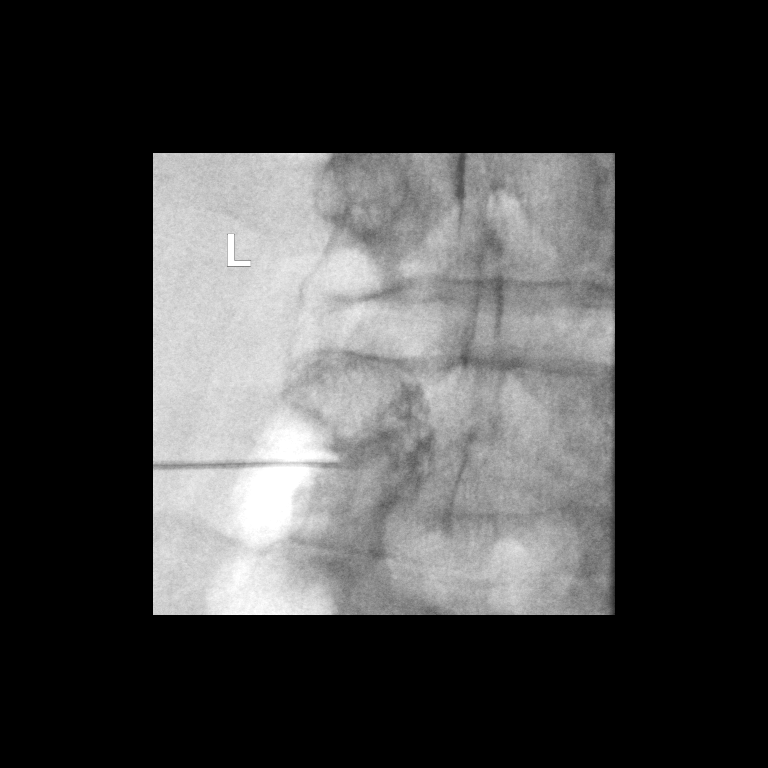

[2 of 2 positions shown; findings below may reference images not displayed]

EXAM:
DG INJECT/PETERSON INC NEEDLE/LUAT/CADECA EPI/MANJU/SAC W/IMG

FLUOROSCOPY TIME:  Radiation Exposure Index (as provided by the
fluoroscopic device): 3.0 mGy

Fluoroscopy Time:  27 seconds

Number of Acquired Images:  0

PROCEDURE:
The procedure, risks, benefits, and alternatives were explained to
the patient. Questions regarding the procedure were encouraged and
answered. The patient understands and consents to the procedure.

LEFT L5 NERVE ROOT BLOCK AND TRANSFORAMINAL EPIDURAL: A posterior
oblique approach was taken to the intervertebral foramen on the left
at L5-S1 using a curved 3.5 inch 22 gauge spinal needle. Injection
of Isovue M 200 outlined the left L5 nerve root and showed good
epidural spread. No vascular opacification is seen. 120 mg of
Depo-Medrol mixed with 2 mL of 1% lidocaine were instilled. The
procedure was well-tolerated, and the patient was discharged thirty
minutes following the injection in good condition.

COMPLICATIONS:
None immediate.
IMPRESSION: Technically successful injection consisting of a left L5 nerve root
block and transforaminal epidural.

## 2021-11-14 ENCOUNTER — Other Ambulatory Visit: Payer: Self-pay | Admitting: Family Medicine

## 2021-11-14 ENCOUNTER — Ambulatory Visit
Admission: RE | Admit: 2021-11-14 | Discharge: 2021-11-14 | Disposition: A | Discharge status: Home / Self Care | Payer: No Typology Code available for payment source | Source: Ambulatory Visit | Attending: Family Medicine | Admitting: Family Medicine

## 2021-11-14 DIAGNOSIS — M48061 Spinal stenosis, lumbar region without neurogenic claudication: Secondary | ICD-10-CM | POA: Diagnosis not present

## 2021-11-14 DIAGNOSIS — M5416 Radiculopathy, lumbar region: Secondary | ICD-10-CM

## 2021-11-14 DIAGNOSIS — M4727 Other spondylosis with radiculopathy, lumbosacral region: Secondary | ICD-10-CM | POA: Diagnosis not present

## 2021-11-14 MED ORDER — IOPAMIDOL (ISOVUE-M 200) INJECTION 41%
1.0000 mL | Freq: Once | INTRAMUSCULAR | Status: AC
  Administered 2021-11-14: 1 mL via EPIDURAL

## 2021-11-14 MED ORDER — METHYLPREDNISOLONE ACETATE 40 MG/ML INJ SUSP (RADIOLOG
80.0000 mg | Freq: Once | INTRAMUSCULAR | Status: AC
  Administered 2021-11-14: 80 mg via EPIDURAL

## 2021-11-14 NOTE — Discharge Instructions (Signed)

## 2021-11-28 DIAGNOSIS — M4316 Spondylolisthesis, lumbar region: Secondary | ICD-10-CM | POA: Diagnosis not present

## 2021-12-04 ENCOUNTER — Other Ambulatory Visit: Payer: Self-pay | Admitting: Neurological Surgery

## 2021-12-24 ENCOUNTER — Ambulatory Visit
Admission: EM | Admit: 2021-12-24 | Discharge: 2021-12-24 | Disposition: A | Discharge status: Home / Self Care | Payer: No Typology Code available for payment source | Attending: Internal Medicine | Admitting: Internal Medicine

## 2021-12-24 DIAGNOSIS — L03019 Cellulitis of unspecified finger: Secondary | ICD-10-CM | POA: Diagnosis not present

## 2021-12-24 MED ORDER — IBUPROFEN 600 MG PO TABS: 600.0000 mg | ORAL_TABLET | Freq: Four times a day (QID) | ORAL | 0 refills | Status: DC | PRN

## 2021-12-24 MED ORDER — SULFAMETHOXAZOLE-TRIMETHOPRIM 800-160 MG PO TABS: 1.0000 | ORAL_TABLET | Freq: Two times a day (BID) | ORAL | 0 refills | Status: DC

## 2021-12-24 NOTE — ED Provider Notes (Signed)
[facility]    CSN: [id number] Arrival date & time: 12/24/21  0809      History   Chief Complaint Chief Complaint  Patient presents with   Finger Infection    HPI Tonya Ingram is a 71 y.o. female comes to the urgent care with 4 to 5-week history of painful swelling of the right index finger.  Patient states pain is of moderate severity and started insidiously about 5 weeks ago.  She has been applying mupirocin cream and other over-the-counter regimens with no improvement in pain.  No fever or chills.  No trauma to the finger.  Mild finger swelling.  HPI  Past Medical History:  Diagnosis Date   Arthritis    Depression    GERD (gastroesophageal reflux disease)    Hypothyroidism    Raynaud's disease    Thyroid disease     Patient Active Problem List   Diagnosis Date Noted   Status post total replacement of left hip 06/30/2020   Unilateral primary osteoarthritis, left hip 04/03/2020    Past Surgical History:  Procedure Laterality Date   THYROIDECTOMY     TOTAL HIP ARTHROPLASTY Left 06/30/2020   Procedure: LEFT TOTAL HIP ARTHROPLASTY ANTERIOR APPROACH;  Surgeon: Mcarthur Rossetti, MD;  Location: [facility];  Service: Orthopedics;  Laterality: Left;   TUBAL LIGATION      OB History   No obstetric history on file.      Home Medications    Prior to Admission medications   Medication Sig Start Date End Date Taking? Authorizing Provider  ibuprofen (ADVIL) 600 MG tablet Take 1 tablet (600 mg total) by mouth every 6 (six) hours as needed. 12/24/21  Yes Dillon Livermore, Myrene Galas, MD  sulfamethoxazole-trimethoprim (BACTRIM DS) 800-160 MG tablet Take 1 tablet by mouth 2 (two) times daily for 7 days. 12/24/21 12/31/21 Yes Malania Gawthrop, Myrene Galas, MD  Calcium Carbonate-Vitamin D 600-400 MG-UNIT tablet Take 1 tablet by mouth daily.    [provider]  cetirizine (ZYRTEC) 10 MG chewable tablet Chew 10 mg by mouth daily.    [provider]  furosemide (LASIX) 20  MG tablet Take 10 mg by mouth daily as needed for edema.    [provider]  levothyroxine (SYNTHROID) 50 MCG tablet Take 50 mcg by mouth daily before breakfast.    [provider]  magnesium oxide (MAG-OX) 400 MG tablet Take 400 mg by mouth daily.    [provider]  NONFORMULARY OR COMPOUNDED ITEM Apply 1 application topically daily. Hormone Cream : estrogen and progesterone mixed    [provider]    Family History Family History  Problem Relation Age of Onset   Parkinson's disease Mother    Arthritis Mother    Prostate cancer Father    Hypertension Father     Social History Social History   Tobacco Use   Smoking status: Never   Smokeless tobacco: Never  Vaping Use   Vaping Use: Never used  Substance Use Topics   Alcohol use: Never   Drug use: Never     Allergies   Codeine   Review of Systems Review of Systems  Musculoskeletal: Negative.   Skin:  Positive for color change and wound.     Physical Exam Triage Vital Signs ED Triage Vitals  Enc Vitals Group     BP 12/24/21 0822 123/79     Pulse Rate 12/24/21 0822 81     Resp 12/24/21 0822 18     Temp 12/24/21 7846  98.2 F (36.8 C)     Temp Source 12/24/21 0822 Oral     SpO2 12/24/21 0822 96 %     Weight --      Height --      Head Circumference --      Peak Flow --      Pain Score 12/24/21 0823 7     Pain Loc --      Pain Edu? --      Excl. in [place]? --    No data found.  Updated Vital Signs BP 123/79 (BP Location: Left Arm)   Pulse 81   Temp 98.2 F (36.8 C) (Oral)   Resp 18   SpO2 96%   Visual Acuity Right Eye Distance:   Left Eye Distance:   Bilateral Distance:    Right Eye Near:   Left Eye Near:    Bilateral Near:     Physical Exam Vitals and nursing note reviewed.  Constitutional:      Appearance: Normal appearance.  Cardiovascular:     Rate and Rhythm: Normal rate and regular rhythm.  Skin:    Comments: Tender swelling of right index finger.   Shallow ulceration on the lateral nail fold of the right index finger.  Mild erythema.  Neurological:     Mental Status: She is alert.      UC Treatments / Results  Labs (all labs ordered are listed, but only abnormal results are displayed) Labs Reviewed - No data to display  EKG   Radiology No results found.  Procedures Procedures (including critical care time)  Medications Ordered in UC Medications - No data to display  Initial Impression / Assessment and Plan / UC Course  I have reviewed the triage vital signs and the nursing notes.  Pertinent labs & imaging results that were available during my care of the patient were reviewed by me and considered in my medical decision making (see chart for details).     1.  Paronychia of the right index finger: Warm salt water soaks Ibuprofen as needed for pain Bactrim 800 mg twice daily for 7 days If symptoms worsen please return to urgent care to be reevaluated. Final Clinical Impressions(s) / UC Diagnoses   Final diagnoses:  Paronychia of index finger     Discharge Instructions      Please soak fingers in warm salt water 2-3 times a day Take ibuprofen as needed for pain Take antibiotics as directed If you notice worsening swelling of your finger, drainage or worsening pain please return to urgent care to be reevaluated   ED Prescriptions     Medication Sig Dispense Auth. Provider   sulfamethoxazole-trimethoprim (BACTRIM DS) 800-160 MG tablet Take 1 tablet by mouth 2 (two) times daily for 7 days. 14 tablet Paiton Fosco, Myrene Galas, MD   ibuprofen (ADVIL) 600 MG tablet Take 1 tablet (600 mg total) by mouth every 6 (six) hours as needed. 30 tablet Emanie Behan, Myrene Galas, MD      PDMP not reviewed this encounter.   Chase Picket, MD 12/24/21 [phone]

## 2021-12-24 NOTE — Discharge Instructions (Signed)
Please soak fingers in warm salt water 2-3 times a day Take ibuprofen as needed for pain Take antibiotics as directed If you notice worsening swelling of your finger, drainage or worsening pain please return to urgent care to be reevaluated

## 2021-12-24 NOTE — ED Triage Notes (Signed)
Pt presents with infection to right index finger X 5 weeks.

## 2021-12-28 ENCOUNTER — Observation Stay (HOSPITAL_COMMUNITY): Payer: No Typology Code available for payment source

## 2021-12-28 ENCOUNTER — Encounter (HOSPITAL_COMMUNITY): Payer: Self-pay

## 2021-12-28 ENCOUNTER — Emergency Department (HOSPITAL_COMMUNITY): Payer: No Typology Code available for payment source

## 2021-12-28 ENCOUNTER — Other Ambulatory Visit: Payer: Self-pay

## 2021-12-28 ENCOUNTER — Observation Stay (HOSPITAL_COMMUNITY)
Admission: EM | Admit: 2021-12-28 | Discharge: 2021-12-28 | Disposition: A | Discharge status: Home / Self Care | Payer: No Typology Code available for payment source | Attending: Internal Medicine | Admitting: Internal Medicine

## 2021-12-28 DIAGNOSIS — L98499 Non-pressure chronic ulcer of skin of other sites with unspecified severity: Secondary | ICD-10-CM | POA: Diagnosis not present

## 2021-12-28 DIAGNOSIS — Z96642 Presence of left artificial hip joint: Secondary | ICD-10-CM | POA: Diagnosis not present

## 2021-12-28 DIAGNOSIS — S61208A Unspecified open wound of other finger without damage to nail, initial encounter: Secondary | ICD-10-CM | POA: Diagnosis not present

## 2021-12-28 DIAGNOSIS — R001 Bradycardia, unspecified: Secondary | ICD-10-CM | POA: Diagnosis not present

## 2021-12-28 DIAGNOSIS — E039 Hypothyroidism, unspecified: Secondary | ICD-10-CM | POA: Insufficient documentation

## 2021-12-28 DIAGNOSIS — Z79899 Other long term (current) drug therapy: Secondary | ICD-10-CM | POA: Diagnosis not present

## 2021-12-28 DIAGNOSIS — I700 Atherosclerosis of aorta: Secondary | ICD-10-CM | POA: Diagnosis not present

## 2021-12-28 DIAGNOSIS — I998 Other disorder of circulatory system: Secondary | ICD-10-CM | POA: Diagnosis not present

## 2021-12-28 DIAGNOSIS — F32A Depression, unspecified: Secondary | ICD-10-CM

## 2021-12-28 DIAGNOSIS — M79644 Pain in right finger(s): Secondary | ICD-10-CM | POA: Diagnosis present

## 2021-12-28 DIAGNOSIS — R600 Localized edema: Secondary | ICD-10-CM | POA: Diagnosis not present

## 2021-12-28 DIAGNOSIS — M7989 Other specified soft tissue disorders: Secondary | ICD-10-CM | POA: Diagnosis not present

## 2021-12-28 DIAGNOSIS — R911 Solitary pulmonary nodule: Secondary | ICD-10-CM | POA: Diagnosis not present

## 2021-12-28 DIAGNOSIS — I7300 Raynaud's syndrome without gangrene: Secondary | ICD-10-CM | POA: Diagnosis not present

## 2021-12-28 LAB — CBC WITH DIFFERENTIAL/PLATELET
Abs Immature Granulocytes: 0.01 10*3/uL (ref 0.00–0.07)
Basophils Absolute: 0 10*3/uL (ref 0.0–0.1)
Basophils Relative: 1 %
Eosinophils Absolute: 0.2 10*3/uL (ref 0.0–0.5)
Eosinophils Relative: 4 %
HCT: 36.7 % (ref 36.0–46.0)
Hemoglobin: 12 g/dL (ref 12.0–15.0)
Immature Granulocytes: 0 %
Lymphocytes Relative: 38 %
Lymphs Abs: 1.8 10*3/uL (ref 0.7–4.0)
MCH: 32.8 pg (ref 26.0–34.0)
MCHC: 32.7 g/dL (ref 30.0–36.0)
MCV: 100.3 fL — ABNORMAL HIGH (ref 80.0–100.0)
Monocytes Absolute: 0.7 10*3/uL (ref 0.1–1.0)
Monocytes Relative: 15 %
Neutro Abs: 2 10*3/uL (ref 1.7–7.7)
Neutrophils Relative %: 42 %
Platelets: 301 10*3/uL (ref 150–400)
RBC: 3.66 MIL/uL — ABNORMAL LOW (ref 3.87–5.11)
RDW: 13.1 % (ref 11.5–15.5)
WBC: 4.7 10*3/uL (ref 4.0–10.5)
nRBC: 0 % (ref 0.0–0.2)

## 2021-12-28 LAB — HEPARIN LEVEL (UNFRACTIONATED): Heparin Unfractionated: 0.56 IU/mL (ref 0.30–0.70)

## 2021-12-28 LAB — PROTIME-INR
INR: 0.9 (ref 0.8–1.2)
Prothrombin Time: 12.5 seconds (ref 11.4–15.2)

## 2021-12-28 LAB — I-STAT CHEM 8, ED
BUN: 19 mg/dL (ref 8–23)
Calcium, Ion: 1.26 mmol/L (ref 1.15–1.40)
Chloride: 103 mmol/L (ref 98–111)
Creatinine, Ser: 1 mg/dL (ref 0.44–1.00)
Glucose, Bld: 87 mg/dL (ref 70–99)
HCT: 37 % (ref 36.0–46.0)
Hemoglobin: 12.6 g/dL (ref 12.0–15.0)
Potassium: 3.9 mmol/L (ref 3.5–5.1)
Sodium: 140 mmol/L (ref 135–145)
TCO2: 25 mmol/L (ref 22–32)

## 2021-12-28 LAB — APTT: aPTT: 28 seconds (ref 24–36)

## 2021-12-28 LAB — HIV ANTIBODY (ROUTINE TESTING W REFLEX): HIV Screen 4th Generation wRfx: NONREACTIVE

## 2021-12-28 MED ORDER — LEVOTHYROXINE SODIUM 50 MCG PO TABS
50.0000 ug | ORAL_TABLET | Freq: Every day | ORAL | Status: DC
  Administered 2021-12-28: 50 ug via ORAL
  Filled 2021-12-28: qty 1

## 2021-12-28 MED ORDER — ONDANSETRON HCL 4 MG/2ML IJ SOLN: 4.0000 mg | Freq: Four times a day (QID) | INTRAMUSCULAR | Status: DC | PRN

## 2021-12-28 MED ORDER — ACETAMINOPHEN 500 MG PO TABS
1000.0000 mg | ORAL_TABLET | Freq: Three times a day (TID) | ORAL | 0 refills | Status: DC | PRN
Start: 2021-12-28 — End: 2022-01-17

## 2021-12-28 MED ORDER — ONDANSETRON HCL 4 MG PO TABS: 4.0000 mg | ORAL_TABLET | Freq: Four times a day (QID) | ORAL | Status: DC | PRN

## 2021-12-28 MED ORDER — NITROGLYCERIN 2 % TD OINT
1.0000 [in_us] | TOPICAL_OINTMENT | Freq: Once | TRANSDERMAL | Status: AC
  Administered 2021-12-28: 1 [in_us] via TOPICAL
  Filled 2021-12-28: qty 1

## 2021-12-28 MED ORDER — ASPIRIN 81 MG PO TBEC: 81.0000 mg | DELAYED_RELEASE_TABLET | Freq: Every day | ORAL | Status: DC

## 2021-12-28 MED ORDER — TRAZODONE HCL 50 MG PO TABS: 25.0000 mg | ORAL_TABLET | Freq: Every evening | ORAL | Status: DC | PRN

## 2021-12-28 MED ORDER — HYDROMORPHONE HCL 1 MG/ML IJ SOLN
1.0000 mg | INTRAMUSCULAR | Status: DC | PRN
  Administered 2021-12-28: 1 mg via INTRAVENOUS
  Filled 2021-12-28: qty 1

## 2021-12-28 MED ORDER — FENTANYL CITRATE PF 50 MCG/ML IJ SOSY
50.0000 ug | PREFILLED_SYRINGE | Freq: Once | INTRAMUSCULAR | Status: AC
  Administered 2021-12-28: 50 ug via INTRAVENOUS
  Filled 2021-12-28 (×2): qty 1

## 2021-12-28 MED ORDER — MAGNESIUM HYDROXIDE 400 MG/5ML PO SUSP: 30.0000 mL | Freq: Every day | ORAL | Status: DC | PRN

## 2021-12-28 MED ORDER — ACETAMINOPHEN 500 MG PO TABS
1000.0000 mg | ORAL_TABLET | Freq: Once | ORAL | Status: AC
  Administered 2021-12-28: 1000 mg via ORAL
  Filled 2021-12-28: qty 2

## 2021-12-28 MED ORDER — CALCIUM CARBONATE-VITAMIN D 600-400 MG-UNIT PO TABS: 1.0000 | ORAL_TABLET | Freq: Every day | ORAL | Status: DC

## 2021-12-28 MED ORDER — ACETAMINOPHEN 650 MG RE SUPP: 650.0000 mg | Freq: Four times a day (QID) | RECTAL | Status: DC | PRN

## 2021-12-28 MED ORDER — HEPARIN (PORCINE) 25000 UT/250ML-% IV SOLN: 12.0000 [IU]/kg/h | INTRAVENOUS | Status: DC

## 2021-12-28 MED ORDER — LORATADINE 10 MG PO TABS
10.0000 mg | ORAL_TABLET | Freq: Every day | ORAL | Status: DC
  Administered 2021-12-28: 10 mg via ORAL
  Filled 2021-12-28: qty 1

## 2021-12-28 MED ORDER — HEPARIN BOLUS VIA INFUSION
2000.0000 [IU] | Freq: Once | INTRAVENOUS | Status: AC
  Administered 2021-12-28: 2000 [IU] via INTRAVENOUS
  Filled 2021-12-28: qty 2000

## 2021-12-28 MED ORDER — HEPARIN (PORCINE) 25000 UT/250ML-% IV SOLN
950.0000 [IU]/h | INTRAVENOUS | Status: DC
  Administered 2021-12-28: 950 [IU]/h via INTRAVENOUS
  Filled 2021-12-28: qty 250

## 2021-12-28 MED ORDER — CILOSTAZOL 100 MG PO TABS
100.0000 mg | ORAL_TABLET | Freq: Two times a day (BID) | ORAL | Status: DC
Start: 2021-12-28 — End: 2021-12-29
  Administered 2021-12-28: 100 mg via ORAL
  Filled 2021-12-28 (×2): qty 1

## 2021-12-28 MED ORDER — OYSTER SHELL CALCIUM/D3 500-5 MG-MCG PO TABS
1.0000 | ORAL_TABLET | Freq: Every day | ORAL | Status: DC
  Administered 2021-12-28: 1 via ORAL
  Filled 2021-12-28 (×2): qty 1

## 2021-12-28 MED ORDER — MAGNESIUM OXIDE -MG SUPPLEMENT 400 (240 MG) MG PO TABS
400.0000 mg | ORAL_TABLET | Freq: Every day | ORAL | Status: DC
  Administered 2021-12-28: 400 mg via ORAL
  Filled 2021-12-28 (×2): qty 1

## 2021-12-28 MED ORDER — ACETAMINOPHEN 325 MG PO TABS: 650.0000 mg | ORAL_TABLET | Freq: Four times a day (QID) | ORAL | Status: DC | PRN

## 2021-12-28 MED ORDER — IOHEXOL 350 MG/ML SOLN
100.0000 mL | Freq: Once | INTRAVENOUS | Status: AC | PRN
  Administered 2021-12-28: 100 mL via INTRAVENOUS

## 2021-12-28 NOTE — Assessment & Plan Note (Addendum)
-    continue Synthroid. 

## 2021-12-28 NOTE — H&P (Signed)
Hospital Consult    Reason for Consult:  right index finger wound Referring Physician:  Dr. Sabino Gasser MRN #:  [id number]  History of Present Illness: This is a 71 y.o. female without previous vascular disease.  She does have known Raynaud's phenomenon of fingers of both hands.  More recently she has about 1 month history of index finger wound that has failed to heal.  She has been evaluated by hand surgery.  She does not have any previous upper extremity procedures although does have an injury to her right shoulder a few years ago from a fall that resulted in shoulder separation.  She does not have any personal or family history of aneurysm disease.  No previous shoulder upper extremity surgeries.  Heparin drip has been initiated, she does not take any antiplatelet agents.  Past Medical History:  Diagnosis Date   Arthritis    Depression    GERD (gastroesophageal reflux disease)    Hypothyroidism    Raynaud's disease    Thyroid disease     Past Surgical History:  Procedure Laterality Date   THYROIDECTOMY     TOTAL HIP ARTHROPLASTY Left 06/30/2020   Procedure: LEFT TOTAL HIP ARTHROPLASTY ANTERIOR APPROACH;  Surgeon: Mcarthur Rossetti, MD;  Location: [facility];  Service: Orthopedics;  Laterality: Left;   TUBAL LIGATION      Allergies  Allergen Reactions   Codeine Nausea And Vomiting    Prior to Admission medications   Medication Sig Start Date End Date Taking? Authorizing Provider  acetaminophen (TYLENOL) 500 MG tablet Take 1,500 mg by mouth 3 (three) times daily as needed for mild pain.   Yes [provider]  Calcium Carb-Cholecalciferol (CALCIUM + VITAMIN D3 PO) Take 1 tablet by mouth daily.   Yes [provider]  cetirizine (ZYRTEC) 10 MG tablet Take 10 mg by mouth daily.   Yes [provider]  levothyroxine (SYNTHROID) 50 MCG tablet Take 50 mcg by mouth daily before breakfast.   Yes [provider]  MAGNESIUM PO Take 1 tablet by mouth daily.    Yes [provider]  meclizine (ANTIVERT) 25 MG tablet Take 25 mg by mouth 3 (three) times daily as needed for dizziness (vertigo).   Yes [provider]  NONFORMULARY OR COMPOUNDED ITEM Apply 1 application topically daily. Hormone Cream : estrogen and progesterone mixed   Yes [provider]  sulfamethoxazole-trimethoprim (BACTRIM DS) 800-160 MG tablet Take 1 tablet by mouth 2 (two) times daily for 7 days. Patient taking differently: Take 1 tablet by mouth 2 (two) times daily. 7 day course. Pt is on day 4. 12/24/21 12/31/21 Yes Lamptey, Myrene Galas, MD  valACYclovir (VALTREX) 500 MG tablet Take 500 mg by mouth 2 (two) times daily. 12/01/21  Yes [provider]  ibuprofen (ADVIL) 600 MG tablet Take 1 tablet (600 mg total) by mouth every 6 (six) hours as needed. Patient not taking: Reported on 12/28/2021 12/24/21   Lamptey, Myrene Galas, MD    Social History   Socioeconomic History   Marital status: Divorced    Spouse name: Not on file   Number of children: Not on file   Years of education: Not on file   Highest education level: Not on file  Occupational History   Not on file  Tobacco Use   Smoking status: Never   Smokeless tobacco: Never  Vaping Use   Vaping Use: Never used  Substance and Sexual Activity   Alcohol use: Never   Drug use: Never  Sexual activity: Yes  Other Topics Concern   Not on file  Social History Narrative   Not on file   Social Determinants of Health   Financial Resource Strain: Not on file  Food Insecurity: Not on file  Transportation Needs: Not on file  Physical Activity: Not on file  Stress: Not on file  Social Connections: Not on file  Intimate Partner Violence: Not on file     Family History  Problem Relation Age of Onset   Parkinson's disease Mother    Arthritis Mother    Prostate cancer Father    Hypertension Father     Review of Systems  Constitutional: Negative.   HENT: Negative.    Eyes: Negative.    Respiratory: Negative.    Cardiovascular: Negative.   Gastrointestinal: Negative.   Musculoskeletal:        Wound right index finger  Skin: Negative.   Neurological: Negative.   Endo/Heme/Allergies: Negative.   Psychiatric/Behavioral: Negative.       Physical Examination  Vitals:   12/28/21 0852 12/28/21 1141  BP: 133/79 119/75  Pulse: (!) 58 (!) 57  Resp: 18 18  Temp: 97.7 F (36.5 C) 98.1 F (36.7 C)  SpO2: 100% 100%   Body mass index is 19.97 kg/m.  Physical Exam HENT:     Head: Normocephalic.     Nose: Nose normal.  Eyes:     Pupils: Pupils are equal, round, and reactive to light.  Cardiovascular:     Rate and Rhythm: Normal rate.     Pulses: Normal pulses.  Pulmonary:     Effort: Pulmonary effort is normal.  Abdominal:     General: Abdomen is flat.  Musculoskeletal:        General: Normal range of motion.     Cervical back: Normal range of motion and neck supple.     Right lower leg: No edema.     Left lower leg: No edema.  Skin:    General: Skin is dry.     Capillary Refill: Capillary refill takes less than 2 seconds.     Comments: Small wound distal right index finger measuring 0.5 cm without any surrounding erythema  Neurological:     General: No focal deficit present.     Mental Status: She is alert.  Psychiatric:        Mood and Affect: Mood normal.     CBC    Component Value Date/Time   WBC 4.7 12/28/2021 0150   RBC 3.66 (L) 12/28/2021 0150   HGB 12.6 12/28/2021 0215   HCT 37.0 12/28/2021 0215   PLT 301 12/28/2021 0150   MCV 100.3 (H) 12/28/2021 0150   MCH 32.8 12/28/2021 0150   MCHC 32.7 12/28/2021 0150   RDW 13.1 12/28/2021 0150   LYMPHSABS 1.8 12/28/2021 0150   MONOABS 0.7 12/28/2021 0150   EOSABS 0.2 12/28/2021 0150   BASOSABS 0.0 12/28/2021 0150    BMET    Component Value Date/Time   NA 140 12/28/2021 0215   K 3.9 12/28/2021 0215   CL 103 12/28/2021 0215   CO2 26 12/26/2020 1410   GLUCOSE 87 12/28/2021 0215   BUN 19  12/28/2021 0215   CREATININE 1.00 12/28/2021 0215   CALCIUM 8.9 12/26/2020 1410   GFRNONAA >60 12/26/2020 1410    COAGS: Lab Results  Component Value Date   INR 0.9 12/28/2021     Non-Invasive Vascular Imaging:   No studies   ASSESSMENT/PLAN: 71 y.o. female here with nonhealing right  index finger wound concerning for ischemic insult.  She does have easily palpable pulses and other fingers appear grossly well perfused.  I have concerned that she may have an embolizing lesion that has caused a blue finger type syndrome particularly from her shoulder or thoracic aorta.  I have ordered CT scan for better evaluation of her central arterial structures.  If this is normal she would likely benefit from angiography to better define her arch anatomy and index finger flow.  Angiography could be performed as outpatient patient should be okay for discharge from vascular standpoint as long as there are no indications for acute treatment of an embolizing lesion after CT scan results.  Would likely benefit from at least baby aspirin therapy on discharge  Mirren Gest C. Donzetta Matters, MD [facility] Office: [phone] Pager: [phone]

## 2021-12-28 NOTE — ED Notes (Signed)
[name], CN at [facility], refused report due to no beds available in the ED. All MD's aware via secure message.

## 2021-12-28 NOTE — Care Management Obs Status (Signed)
[place] NOTIFICATION   Patient Details  Name: Crislyn Willbanks MRN: [id number] Date of Birth: [birth year]   Medicare Observation Status Notification Given:  Yes    Angelita Ingles, RN 12/28/2021, 3:07 PM

## 2021-12-28 NOTE — ED Notes (Signed)
Patient complains of intense heat causing pain and finger stinging. Patient removed finger from hot packs. MD notified.

## 2021-12-28 NOTE — ED Notes (Signed)
Finger sladdered with NTG, wrapped with 2"kling applied.

## 2021-12-28 NOTE — TOC Progression Note (Signed)
Transition of Care [facility]) - Progression Note    Patient Details  Name: Tonya Ingram MRN: [id number] Date of Birth: [birth year]  Transition of Care [facility]) CM/SW [place], RN Phone Number:[phone]  12/28/2021, 3:33 PM  Clinical Narrative:     Transition of Care [facility]) Screening Note   Patient Details  Name: Tonya Ingram Date of Birth: 1950-11-03   Transition of Care [facility]) CM/SW Contact:    Angelita Ingles, RN Phone Number: 12/28/2021, 3:33 PM    Transition of Care Department [facility]) has reviewed patient and no TOC needs have been identified at this time. We will continue to monitor patient advancement through interdisciplinary progression rounds.           Expected Discharge Plan and Services                                                 Social Determinants of Health (SDOH) Interventions    Readmission Risk Interventions     No data to display

## 2021-12-28 NOTE — ED Notes (Signed)
ED Provider at bedside. 

## 2021-12-28 NOTE — ED Notes (Signed)
Patient complains of unbearable pain to finger, finger wiped with alcohol pad and cool papertowels applied.  MD aware.

## 2021-12-28 NOTE — ED Notes (Signed)
Call placed to [facility] to give report to receiving nurse. Writer notified that receiving nurse had already looked over the chart and is ready to receive patient.

## 2021-12-28 NOTE — ED Notes (Signed)
Placed on cardiac monitor 

## 2021-12-28 NOTE — Progress Notes (Signed)
ANTICOAGULATION CONSULT NOTE  Pharmacy Consult for Heparin Indication: ischemic finger  Allergies  Allergen Reactions   Codeine Nausea And Vomiting    Patient Measurements: Height: '5\' 5"'$  (165.1 cm) Weight: 54.4 kg (120 lb) IBW/kg (Calculated) : 57 Heparin Dosing Weight: actual body weight  Vital Signs: Temp: 97.7 F (36.5 C) (07/07 0852) Temp Source: Oral (07/07 0852) BP: 133/79 (07/07 0852) Pulse Rate: 58 (07/07 0852)  Labs: Recent Labs    12/28/21 0150 12/28/21 0215 12/28/21 0326 12/28/21 0929  HGB 12.0 12.6  --   --   HCT 36.7 37.0  --   --   PLT 301  --   --   --   APTT  --   --  28  --   LABPROT  --   --  12.5  --   INR  --   --  0.9  --   HEPARINUNFRC  --   --   --  0.56  CREATININE  --  1.00  --   --      Estimated Creatinine Clearance: 45.6 mL/min (by C-G formula based on SCr of 1 mg/dL).   Medical History: Past Medical History:  Diagnosis Date   Arthritis    Depression    GERD (gastroesophageal reflux disease)    Hypothyroidism    Raynaud's disease    Thyroid disease     Medications:  No oral anticoagulation PTA  Assessment: 71 yr female with reported finger infection x 4 weeks and has been taking Septra since 12/24/21. Finger noted to be blue and pale. Pharmacy consulted for IV heparin management 12/28/21 heparin level WNL. No documented signs/symptoms of bleeding. CBC and plts stable.   Goal of Therapy:  Heparin level 0.3-0.7 units/ml Monitor platelets by anticoagulation protocol: Yes   Plan:  Continue heparin gtt @ 950 units/hr Next level with AM labs  Follow daily heparin level and CBC Monitor for signs & symptoms of bleeding  Billey Gosling, PharmD 12/28/2021,10:16 AM

## 2021-12-28 NOTE — ED Notes (Signed)
Patient refused to allow NTG cream to stay on finger, demanded it be removed.

## 2021-12-28 NOTE — Discharge Summary (Signed)
Physician Discharge Summary   Tonya Ingram FXT:[id number] DOB: Mar 16, 1951 DOA: 12/28/2021  PCP: Caren Macadam, MD  Admit date: 12/28/2021 Discharge date: 12/28/2021  Admitted From: Home Disposition:  Home Discharging physician: Dwyane Dee, MD  Recommendations for Outpatient Follow-up:  Follow up with vascular surgery   Discharge Condition: stable CODE STATUS: Full Diet recommendation:  Diet Orders (From admission, onward)     Start     Ordered   12/28/21 1238  Diet regular Room service appropriate? Yes; Fluid consistency: Thin  Diet effective now       Question Answer Comment  Room service appropriate? Yes   Fluid consistency: Thin      12/28/21 1238            Hospital Course:  Assessment and Plan: * Ischemia of finger - intact right radial pulse  - does have ulcer noted at distal tip of finger; etiology possibly ischemia vs some contribution of underlying Raynaud's  - s/p heparin drip on admission with improvement in color of finger - evaluated by hand surgery and vascular surgery  - underwent CTA chest/aorta prior to discharge   Depression - continue Lexapro.  Hypothyroidism - continue Synthroid.   The patient's chronic medical conditions were treated accordingly per the patient's home medication regimen except as noted.  On day of discharge, patient was felt deemed stable for discharge. Patient/family member advised to call PCP or come back to ER if needed.   Principal Diagnosis: Ischemia of finger  Discharge Diagnoses: Active Hospital Problems   Diagnosis Date Noted   Ischemia of finger 12/28/2021    Priority: 1.   Hypothyroidism 12/28/2021   Depression 12/28/2021    Resolved Hospital Problems  No resolved problems to display.      Allergies as of 12/28/2021       Reactions   Codeine Nausea And Vomiting     Med Rec must be completed prior to using this SMART[employer]***       Allergies  Allergen Reactions   Codeine Nausea And  Vomiting    Consultations: Vascular surgery  Procedures:   Discharge Exam: BP (!) 127/59 (BP Location: Right Arm)   Pulse 66   Temp 98.2 F (36.8 C) (Oral)   Resp 18   Ht '5\' 5"'$  (1.651 m)   Wt 54.4 kg   SpO2 100%   BMI 19.97 kg/m  Physical Exam Constitutional:      General: She is not in acute distress.    Appearance: Normal appearance.  HENT:     Head: Normocephalic and atraumatic.     Mouth/Throat:     Mouth: Mucous membranes are moist.  Eyes:     Extraocular Movements: Extraocular movements intact.  Cardiovascular:     Rate and Rhythm: Normal rate and regular rhythm.     Pulses: Normal pulses.     Comments: Intact pulse, 3+ in right radial artery Pulmonary:     Effort: Pulmonary effort is normal.     Breath sounds: Normal breath sounds.  Abdominal:     General: Bowel sounds are normal. There is no distension.     Palpations: Abdomen is soft.     Tenderness: There is no abdominal tenderness.  Musculoskeletal:        General: Normal range of motion.     Cervical back: Normal range of motion and neck supple.     Comments: No further cyanosis or blanching of right index finger noted; small distal ulceration noted in distal fingertip next to nailbed  Skin:    General: Skin is warm and dry.  Neurological:     General: No focal deficit present.     Mental Status: She is alert.  Psychiatric:        Mood and Affect: Mood normal.        Behavior: Behavior normal.      The results of significant diagnostics from this hospitalization (including imaging, microbiology, ancillary and laboratory) are listed below for reference.   Microbiology: No results found for this or any previous visit (from the past 240 hour(s)).   Labs: BNP (last 3 results) No results for input(s): "BNP" in the last 8760 hours. Basic Metabolic Panel: Recent Labs  Lab 12/28/21 0215  NA 140  K 3.9  CL 103  GLUCOSE 87  BUN 19  CREATININE 1.00   Liver Function Tests: No results for  input(s): "AST", "ALT", "ALKPHOS", "BILITOT", "PROT", "ALBUMIN" in the last 168 hours. No results for input(s): "LIPASE", "AMYLASE" in the last 168 hours. No results for input(s): "AMMONIA" in the last 168 hours. CBC: Recent Labs  Lab 12/28/21 0150 12/28/21 0215  WBC 4.7  --   NEUTROABS 2.0  --   HGB 12.0 12.6  HCT 36.7 37.0  MCV 100.3*  --   PLT 301  --    Cardiac Enzymes: No results for input(s): "CKTOTAL", "CKMB", "CKMBINDEX", "TROPONINI" in the last 168 hours. BNP: Invalid input(s): "POCBNP" CBG: No results for input(s): "GLUCAP" in the last 168 hours. D-Dimer No results for input(s): "DDIMER" in the last 72 hours. Hgb A1c No results for input(s): "HGBA1C" in the last 72 hours. Lipid Profile No results for input(s): "CHOL", "HDL", "LDLCALC", "TRIG", "CHOLHDL", "LDLDIRECT" in the last 72 hours. Thyroid function studies No results for input(s): "TSH", "T4TOTAL", "T3FREE", "THYROIDAB" in the last 72 hours.  Invalid input(s): "FREET3" Anemia work up No results for input(s): "VITAMINB12", "FOLATE", "FERRITIN", "TIBC", "IRON", "RETICCTPCT" in the last 72 hours. Urinalysis    Component Value Date/Time   COLORURINE STRAW (A) 12/26/2020 1442   APPEARANCEUR CLEAR 12/26/2020 1442   LABSPEC 1.010 12/26/2020 1442   PHURINE 6.0 12/26/2020 1442   GLUCOSEU NEGATIVE 12/26/2020 1442   HGBUR NEGATIVE 12/26/2020 1442   BILIRUBINUR NEGATIVE 12/26/2020 1442   BILIRUBINUR negative 12/15/2020 1233   KETONESUR NEGATIVE 12/26/2020 1442   PROTEINUR NEGATIVE 12/26/2020 1442   UROBILINOGEN 0.2 12/15/2020 1233   UROBILINOGEN 0.2 11/01/2017 1401   NITRITE NEGATIVE 12/26/2020 1442   LEUKOCYTESUR NEGATIVE 12/26/2020 1442   Sepsis Labs Recent Labs  Lab 12/28/21 0150  WBC 4.7   Microbiology No results found for this or any previous visit (from the past 240 hour(s)).  Procedures/Studies: DG Hand Complete Right  Result Date: 12/28/2021 CLINICAL DATA:  Infection of index finger.  Pain and  swelling. EXAM: RIGHT HAND - COMPLETE 3+ VIEW COMPARISON:  None Available. FINDINGS: No fracture. No dislocation. Multifocal osteoarthritis, most prominently affecting the thumb carpal metacarpal joint. There is also involvement of the second and third metacarpal phalangeal joints. No erosion, periosteal reaction, or bone destruction. Soft tissue edema of the index finger. No soft tissue gas. No radiopaque foreign body. IMPRESSION: 1. Soft tissue edema of the index finger. No soft tissue gas or radiographic findings of osteomyelitis. 2. Multifocal osteoarthritis, most prominently affecting the thumb carpometacarpal joint. Electronically Signed   By: Keith Rake M.D.   On: 12/28/2021 01:44     Time coordinating discharge: Over 30 minutes    Dwyane Dee, MD  [facility] 12/28/2021, 3:25 PM

## 2021-12-28 NOTE — Progress Notes (Signed)
I examined the patient and the right index finger ischemia is resolved her finger was pink she is on heparin her pain is much better controlled she does have an ulcer to the tip of the finger along the ulnar aspect.  Her symptoms have been present for 4 to 5 weeks now they do come and go.  Her pain was severe last night and thus she was admitted.  Appreciate vascular surgery consultation who will obtain CTA for proximal vasculature to a ssess for possible source of embolus.  Possible traditional angiogram to the right hand to assess for vasculature to the digit itself which could potentially be performed on an outpatient basis.  No role for surgical intervention at this time the patient can follow-up with me on an outpatient basis.

## 2021-12-28 NOTE — Assessment & Plan Note (Addendum)
-   continue Lexapro.

## 2021-12-28 NOTE — ED Provider Notes (Addendum)
[place] DEPT Provider Note   CSN: [id number] Arrival date & time: 12/28/21  0020     History  Chief Complaint  Patient presents with   Finger Infection     Tonya Ingram is a 71 y.o. female.  The history is provided by the patient.  Hand Pain This is a new problem. The current episode started more than 1 week ago (4-5 weeks). The problem occurs constantly. The problem has been gradually worsening. Pertinent negatives include no chest pain and no abdominal pain. Nothing aggravates the symptoms. Nothing relieves the symptoms. Treatments tried: keflex. The treatment provided no relief.  Diagnosed with paronychia at urgent care.       Home Medications Prior to Admission medications   Medication Sig Start Date End Date Taking? Authorizing Provider  Calcium Carbonate-Vitamin D 600-400 MG-UNIT tablet Take 1 tablet by mouth daily.    [provider]  cetirizine (ZYRTEC) 10 MG chewable tablet Chew 10 mg by mouth daily.    [provider]  furosemide (LASIX) 20 MG tablet Take 10 mg by mouth daily as needed for edema.    [provider]  ibuprofen (ADVIL) 600 MG tablet Take 1 tablet (600 mg total) by mouth every 6 (six) hours as needed. 12/24/21   LampteyMyrene Galas, MD  levothyroxine (SYNTHROID) 50 MCG tablet Take 50 mcg by mouth daily before breakfast.    [provider]  magnesium oxide (MAG-OX) 400 MG tablet Take 400 mg by mouth daily.    [provider]  NONFORMULARY OR COMPOUNDED ITEM Apply 1 application topically daily. Hormone Cream : estrogen and progesterone mixed    [provider]  sulfamethoxazole-trimethoprim (BACTRIM DS) 800-160 MG tablet Take 1 tablet by mouth 2 (two) times daily for 7 days. 12/24/21 12/31/21  LampteyMyrene Galas, MD      Allergies    Codeine    Review of Systems   Review of Systems  Constitutional:  Negative for fever.  Eyes:  Negative for redness.  Respiratory:   Negative for wheezing and stridor.   Cardiovascular:  Negative for chest pain.  Gastrointestinal:  Negative for abdominal pain.  Genitourinary:  Negative for difficulty urinating.  All other systems reviewed and are negative.   Physical Exam Updated Vital Signs BP 128/60   Pulse (!) 57   Temp (!) 97.5 F (36.4 C)   Resp 18   Ht '5\' 5"'$  (1.651 m)   Wt 54.4 kg   SpO2 100%   BMI 19.97 kg/m  Physical Exam Vitals and nursing note reviewed.  Constitutional:      General: She is not in acute distress.    Appearance: Normal appearance. She is well-developed.  HENT:     Head: Normocephalic and atraumatic.     Nose: Nose normal.  Eyes:     Pupils: Pupils are equal, round, and reactive to light.  Cardiovascular:     Rate and Rhythm: Normal rate and regular rhythm.     Pulses: Normal pulses.     Heart sounds: Normal heart sounds.  Pulmonary:     Effort: Pulmonary effort is normal. No respiratory distress.     Breath sounds: Normal breath sounds.  Abdominal:     General: Bowel sounds are normal. There is no distension.     Palpations: Abdomen is soft.     Tenderness: There is no abdominal tenderness. There is no guarding or rebound.  Genitourinary:    Vagina: No vaginal discharge.  Musculoskeletal:  General: Normal range of motion.     Cervical back: Neck supple.  Skin:    General: Skin is warm and dry.     Capillary Refill: Capillary refill takes less than 2 seconds.     Findings: No erythema or rash.  Neurological:     General: No focal deficit present.     Mental Status: She is alert and oriented to person, place, and time.     Deep Tendon Reflexes: Reflexes normal.  Psychiatric:        Mood and Affect: Mood normal.        Behavior: Behavior normal.        ED Results / Procedures / Treatments   Labs (all labs ordered are listed, but only abnormal results are displayed) Results for orders placed or performed during the hospital encounter of 12/28/21  CBC  with Differential  Result Value Ref Range   WBC 4.7 4.0 - 10.5 K/uL   RBC 3.66 (L) 3.87 - 5.11 MIL/uL   Hemoglobin 12.0 12.0 - 15.0 g/dL   HCT 36.7 36.0 - 46.0 %   MCV 100.3 (H) 80.0 - 100.0 fL   MCH 32.8 26.0 - 34.0 pg   MCHC 32.7 30.0 - 36.0 g/dL   RDW 13.1 11.5 - 15.5 %   Platelets 301 150 - 400 K/uL   nRBC 0.0 0.0 - 0.2 %   Neutrophils Relative % 42 %   Neutro Abs 2.0 1.7 - 7.7 K/uL   Lymphocytes Relative 38 %   Lymphs Abs 1.8 0.7 - 4.0 K/uL   Monocytes Relative 15 %   Monocytes Absolute 0.7 0.1 - 1.0 K/uL   Eosinophils Relative 4 %   Eosinophils Absolute 0.2 0.0 - 0.5 K/uL   Basophils Relative 1 %   Basophils Absolute 0.0 0.0 - 0.1 K/uL   Immature Granulocytes 0 %   Abs Immature Granulocytes 0.01 0.00 - 0.07 K/uL  APTT  Result Value Ref Range   aPTT 28 24 - 36 seconds  Protime-INR  Result Value Ref Range   Prothrombin Time 12.5 11.4 - 15.2 seconds   INR 0.9 0.8 - 1.2  I-stat chem 8, ED (not at [facility] or [facility])  Result Value Ref Range   Sodium 140 135 - 145 mmol/L   Potassium 3.9 3.5 - 5.1 mmol/L   Chloride 103 98 - 111 mmol/L   BUN 19 8 - 23 mg/dL   Creatinine, Ser 1.00 0.44 - 1.00 mg/dL   Glucose, Bld 87 70 - 99 mg/dL   Calcium, Ion 1.26 1.15 - 1.40 mmol/L   TCO2 25 22 - 32 mmol/L   Hemoglobin 12.6 12.0 - 15.0 g/dL   HCT 37.0 36.0 - 46.0 %   DG Hand Complete Right  Result Date: 12/28/2021 CLINICAL DATA:  Infection of index finger.  Pain and swelling. EXAM: RIGHT HAND - COMPLETE 3+ VIEW COMPARISON:  None Available. FINDINGS: No fracture. No dislocation. Multifocal osteoarthritis, most prominently affecting the thumb carpal metacarpal joint. There is also involvement of the second and third metacarpal phalangeal joints. No erosion, periosteal reaction, or bone destruction. Soft tissue edema of the index finger. No soft tissue gas. No radiopaque foreign body. IMPRESSION: 1. Soft tissue edema of the index finger. No soft tissue gas or radiographic findings of osteomyelitis.  2. Multifocal osteoarthritis, most prominently affecting the thumb carpometacarpal joint. Electronically Signed   By: Keith Rake M.D.   On: 12/28/2021 01:44      Radiology DG Hand Complete Right  Result Date: 12/28/2021 CLINICAL DATA:  Infection of index finger.  Pain and swelling. EXAM: RIGHT HAND - COMPLETE 3+ VIEW COMPARISON:  None Available. FINDINGS: No fracture. No dislocation. Multifocal osteoarthritis, most prominently affecting the thumb carpal metacarpal joint. There is also involvement of the second and third metacarpal phalangeal joints. No erosion, periosteal reaction, or bone destruction. Soft tissue edema of the index finger. No soft tissue gas. No radiopaque foreign body. IMPRESSION: 1. Soft tissue edema of the index finger. No soft tissue gas or radiographic findings of osteomyelitis. 2. Multifocal osteoarthritis, most prominently affecting the thumb carpometacarpal joint. Electronically Signed   By: Keith Rake M.D.   On: 12/28/2021 01:44    Procedures Procedures    Medications Ordered in ED Medications  acetaminophen (TYLENOL) tablet 1,000 mg (1,000 mg Oral Given 12/28/21 0157)  nitroGLYCERIN (NITROGLYN) 2 % ointment 1 inch (1 inch Topical Given 12/28/21 0217)    ED Course/ Medical Decision Making/ A&P                           Medical Decision Making Blue finger x 4-5 weeks   Amount and/or Complexity of Data Reviewed External Data Reviewed: notes.    Details: previous notes reviewed Labs: ordered.    Details: all labs reviewed: normal white count and hemoglobin and platelets, normal sodium 140, normal potassium, normal creatinine. Radiology: ordered. Discussion of management or test interpretation with external provider(s): Case d/w Dr. Greta Doom admit to medicine digit is ischemic and will need angio.  Please admit at [facility]  Risk OTC drugs. Prescription drug management. Decision regarding hospitalization.    Final Clinical Impression(s) / ED  Diagnoses Final diagnoses:  None   The patient appears reasonably stabilized for admission considering the current resources, flow, and capabilities available in the ED at this time, and I doubt any other [facility] requiring further screening and/or treatment in the ED prior to admission.  Rx / DC Orders ED Discharge Orders     None            Darlene Brozowski, MD 12/28/21 [phone]

## 2021-12-28 NOTE — H&P (Signed)
PATIENT NAME: Tonya Ingram    MR#:  [id number]  DATE OF BIRTH:  [birth year]  DATE OF ADMISSION:  12/28/2021  PRIMARY CARE PHYSICIAN: Caren Macadam, MD   Patient is coming from: Home  REQUESTING/REFERRING PHYSICIAN: [place], April, MD  CHIEF COMPLAINT:   Chief Complaint  Patient presents with   Finger Infection     HISTORY OF PRESENT ILLNESS:  Tonya Ingram is a 71 y.o. female with medical history significant for GERD, hypothyroidism, Raynaud's disease, depression and osteoarthritis, who presented to the emergency room with acute onset of worsening right index finger swelling with associated pain.  The symptoms have been going on over the last 4 weeks.  She was apparently managed for paronychia and was given antibiotics as well without improvement of her pain or swelling.  She denies any fever or chills.  No chest pain or dyspnea or cough.  No nausea or vomiting or abdominal pain.  She denies any tobacco abuse.  She was seen in urgent care on Monday and was given Septra DS.  ED Course: Upon presentation to the emergency room, BP was 142/75 with otherwise unremarkable vital signs.  Labs revealed unremarkable BMP and macrocytosis with otherwise unremarkable CBC.  Imaging: Right hand x-ray showed soft tissue edema of the index finger with no soft tissue gas or radiographic findings of osteomyelitis.  It showed multifocal osteoarthritis most prominent to the affecting the thumb carpometacarpal joint.  The patient was given IV heparin as well as 50 mcg of IV fentanyl, 1 inch of Nitropaste and 1 g of p.o. Tylenol.  Contact was made with Dr. Greta Doom at [facility] and plan was made for transfer there for angiography and vascular intervention.  The patient will be admitted to [facility] for further evaluation and management. PAST MEDICAL HISTORY:   Past Medical History:  Diagnosis Date   Arthritis    Depression    GERD (gastroesophageal reflux disease)    Hypothyroidism     Raynaud's disease    Thyroid disease     PAST SURGICAL HISTORY:   Past Surgical History:  Procedure Laterality Date   THYROIDECTOMY     TOTAL HIP ARTHROPLASTY Left 06/30/2020   Procedure: LEFT TOTAL HIP ARTHROPLASTY ANTERIOR APPROACH;  Surgeon: Mcarthur Rossetti, MD;  Location: [facility];  Service: Orthopedics;  Laterality: Left;   TUBAL LIGATION      SOCIAL HISTORY:   Social History   Tobacco Use   Smoking status: Never   Smokeless tobacco: Never  Substance Use Topics   Alcohol use: Never    FAMILY HISTORY:   Family History  Problem Relation Age of Onset   Parkinson's disease Mother    Arthritis Mother    Prostate cancer Father    Hypertension Father     DRUG ALLERGIES:   Allergies  Allergen Reactions   Codeine Nausea And Vomiting    REVIEW OF SYSTEMS:   ROS As per history of present illness. All pertinent systems were reviewed above. Constitutional, HEENT, cardiovascular, respiratory, GI, GU, musculoskeletal, neuro, psychiatric, endocrine, integumentary and hematologic systems were reviewed and are otherwise negative/unremarkable except for positive findings mentioned above in the HPI.   MEDICATIONS AT HOME:   Prior to Admission medications   Medication Sig Start Date End Date Taking? Authorizing Provider  Calcium Carbonate-Vitamin D 600-400 MG-UNIT tablet Take 1 tablet by mouth daily.    [provider]  cetirizine (ZYRTEC) 10 MG chewable tablet Chew 10 mg by mouth daily.  [provider]  furosemide (LASIX) 20 MG tablet Take 10 mg by mouth daily as needed for edema.    [provider]  ibuprofen (ADVIL) 600 MG tablet Take 1 tablet (600 mg total) by mouth every 6 (six) hours as needed. 12/24/21   LampteyMyrene Galas, MD  levothyroxine (SYNTHROID) 50 MCG tablet Take 50 mcg by mouth daily before breakfast.    [provider]  magnesium oxide (MAG-OX) 400 MG tablet Take 400 mg by mouth daily.    [provider]   NONFORMULARY OR COMPOUNDED ITEM Apply 1 application topically daily. Hormone Cream : estrogen and progesterone mixed    [provider]  sulfamethoxazole-trimethoprim (BACTRIM DS) 800-160 MG tablet Take 1 tablet by mouth 2 (two) times daily for 7 days. 12/24/21 12/31/21  Chase Picket, MD      VITAL SIGNS:  Blood pressure 133/79, pulse (!) 58, temperature 97.7 F (36.5 C), temperature source Oral, resp. rate 18, height '5\' 5"'$  (1.651 m), weight 54.4 kg, SpO2 100 %.  PHYSICAL EXAMINATION:  Physical Exam  GENERAL:  71 y.o.-year-old Caucasian female patient lying in the bed with no acute distress.  EYES: Pupils equal, round, reactive to light and accommodation. No scleral icterus. Extraocular muscles intact.  HEENT: Head atraumatic, normocephalic. Oropharynx and nasopharynx clear.  NECK:  Supple, no jugular venous distention. No thyroid enlargement, no tenderness.  LUNGS: Normal breath sounds bilaterally, no wheezing, rales,rhonchi or crepitation. No use of accessory muscles of respiration.  CARDIOVASCULAR: Regular rate and rhythm, S1, S2 normal. No murmurs, rubs, or gallops.  Swollen right index finger with erythema and purplish discoloration, warmth and tenderness. ABDOMEN: Soft, nondistended, nontender. Bowel sounds present. No organomegaly or mass.  EXTREMITIES: No pedal edema, cyanosis, or clubbing.  NEUROLOGIC: Cranial nerves II through XII are intact. Muscle strength 5/5 in all extremities. Sensation intact. Gait not checked.  PSYCHIATRIC: The patient is alert and oriented x 3.  Normal affect and good eye contact. SKIN: Right index fingers as shown below and described above otherwise no obvious rash, lesion, or ulcer.      LABORATORY PANEL:   CBC Recent Labs  Lab 12/28/21 0150 12/28/21 0215  WBC 4.7  --   HGB 12.0 12.6  HCT 36.7 37.0  PLT 301  --     ------------------------------------------------------------------------------------------------------------------  Chemistries  Recent Labs  Lab 12/28/21 0215  NA 140  K 3.9  CL 103  GLUCOSE 87  BUN 19  CREATININE 1.00   ------------------------------------------------------------------------------------------------------------------  Cardiac Enzymes No results for input(s): "TROPONINI" in the last 168 hours. ------------------------------------------------------------------------------------------------------------------  RADIOLOGY:  DG Hand Complete Right  Result Date: 12/28/2021 CLINICAL DATA:  Infection of index finger.  Pain and swelling. EXAM: RIGHT HAND - COMPLETE 3+ VIEW COMPARISON:  None Available. FINDINGS: No fracture. No dislocation. Multifocal osteoarthritis, most prominently affecting the thumb carpal metacarpal joint. There is also involvement of the second and third metacarpal phalangeal joints. No erosion, periosteal reaction, or bone destruction. Soft tissue edema of the index finger. No soft tissue gas. No radiopaque foreign body. IMPRESSION: 1. Soft tissue edema of the index finger. No soft tissue gas or radiographic findings of osteomyelitis. 2. Multifocal osteoarthritis, most prominently affecting the thumb carpometacarpal joint. Electronically Signed   By: Keith Rake M.D.   On: 12/28/2021 01:44      IMPRESSION AND PLAN:  Assessment and Plan: * Ischemia of finger - The patient will be admitted to [facility] medical the surgical bed. - This could be secondary  to severe Raynaud's phenomenon. - Dr. Greta Doom was notified and is aware about the patient. - Pain management to be provided. - The patient was given Nitropaste and placed on IV heparin. - We will continue IV heparin and pain management will be provided. - She will have angiography and potential vascular intervention at [facility].  Depression - We will continue Lexapro.  Hypothyroidism -  We will continue Synthroid.    DVT prophylaxis: IV heparin.   Advanced Care Planning:  Code Status: full code.  Family Communication:  The plan of care was discussed in details with the patient (and family). I answered all questions. The patient agreed to proceed with the above mentioned plan. Further management will depend upon hospital course. Disposition Plan: Back to previous home environment Consults called: Dr. Greta Doom with hand surgery was consulted. All the records are reviewed and case discussed with ED provider.  Status is: Inpatient   At the time of the admission, it appears that the appropriate admission status for this patient is inpatient.  This is judged to be reasonable and necessary in order to provide the required intensity of service to ensure the patient's safety given the presenting symptoms, physical exam findings and initial radiographic and laboratory data in the context of comorbid conditions.  The patient requires inpatient status due to high intensity of service, high risk of further deterioration and high frequency of surveillance required.  I certify that at the time of admission, it is my clinical judgment that the patient will require inpatient hospital care extending more than 2 midnights.                            Dispo: The patient is from: Home              Anticipated d/c is to: Home              Patient currently is not medically stable to d/c.              Difficult to place patient: No  Christel Mormon M.D on 12/28/2021 at 9:15 AM  [facility]   From 7 PM-7 AM, contact night-coverage [email or web address]  CC: Primary care physician; Caren Macadam, MD

## 2021-12-28 NOTE — Progress Notes (Signed)
ANTICOAGULATION CONSULT NOTE - Initial Consult  Pharmacy Consult for Heparin Indication: ischemic finger  Allergies  Allergen Reactions   Codeine Nausea And Vomiting    Patient Measurements: Height: '5\' 5"'$  (165.1 cm) Weight: 54.4 kg (120 lb) IBW/kg (Calculated) : 57 Heparin Dosing Weight: actual body weight  Vital Signs: Temp: 97.5 F (36.4 C) (07/07 0042) BP: 124/79 (07/07 0315) Pulse Rate: 60 (07/07 0315)  Labs: Recent Labs    12/28/21 0150 12/28/21 0215  HGB 12.0 12.6  HCT 36.7 37.0  PLT 301  --   CREATININE  --  1.00    Estimated Creatinine Clearance: 45.6 mL/min (by C-G formula based on SCr of 1 mg/dL).   Medical History: Past Medical History:  Diagnosis Date   Arthritis    Depression    GERD (gastroesophageal reflux disease)    Hypothyroidism    Raynaud's disease    Thyroid disease     Medications:  No oral anticoagulation PTA  Assessment: 70 yr female with reported finger infection x 4 weeks and has been taking Septra since 12/24/21. Finger noted to be blue and pale. Pharmacy consulted to begin IV heparin for ischemic finger  Goal of Therapy:  Heparin level 0.3-0.7 units/ml Monitor platelets by anticoagulation protocol: Yes   Plan:  Obtain baseline aPTT and PT/INR Heparin 2000 units IV bolus x 1 followed by heparin gtt @ 950 units/hr Check heparin level 6 hr after heparin gtt started Follow daily heparin level and CBC Monitor for signs & symptoms of bleeding  Gracelynne Benedict, Toribio Harbour, PharmD 12/28/2021,3:20 AM

## 2021-12-28 NOTE — ED Notes (Signed)
[facility] at bedside 

## 2021-12-28 NOTE — ED Notes (Signed)
ED TO INPATIENT HANDOFF REPORT  ED Nurse Name and Phone #:   S Name/Age/Gender Tonya Ingram 71 y.o. female Room/Bed: WA02/WA02  Code Status   Code Status: Full Code  Home/SNF/Other Home Patient oriented to: self, place, time, orientation Is this baseline? Yes   Triage Complete: Triage complete  Chief Complaint Ischemia of finger [I99.8]  Triage Note Pt presents to ED with c/o infection to right index finger x 4 weeks.Pt seen at [facility] 07/3 and placed on Septra DS. Pt denies missing doses, states infection as not gotten better. Index finger noted to be blue and pale upon triage.    Allergies Allergies  Allergen Reactions   Codeine Nausea And Vomiting    Level of Care/Admitting Diagnosis ED Disposition     ED Disposition  Admit   Condition  --   Comment  Hospital Area: [place] [[id number]]  Level of Care: Med-Surg [16]  May admit patient to [facility] or [facility] if equivalent level of care is available:: No  Covid Evaluation: Asymptomatic - no recent exposure (last 10 days) testing not required  Diagnosis: Ischemia of finger [741638]  Admitting Physician: Christel Mormon [[id number]]  Attending Physician: Christel Mormon [[id number]]  Certification:: I certify this patient will need inpatient services for at least 2 midnights  Estimated Length of Stay: 2          B Medical/Surgery History Past Medical History:  Diagnosis Date   Arthritis    Depression    GERD (gastroesophageal reflux disease)    Hypothyroidism    Raynaud's disease    Thyroid disease    Past Surgical History:  Procedure Laterality Date   THYROIDECTOMY     TOTAL HIP ARTHROPLASTY Left 06/30/2020   Procedure: LEFT TOTAL HIP ARTHROPLASTY ANTERIOR APPROACH;  Surgeon: Mcarthur Rossetti, MD;  Location: [facility];  Service: Orthopedics;  Laterality: Left;   TUBAL LIGATION       A IV Location/Drains/Wounds Patient Lines/Drains/Airways Status     Active Line/Drains/Airways      Name Placement date Placement time Site Days   Peripheral IV 12/28/21 20 G 1" Anterior;Left Forearm 12/28/21  0323  Forearm  less than 1   Urethral Catheter Cat Williams, RN Straight-tip 16 Fr. 12/26/20  1443  Straight-tip  367   Incision (Closed) 06/30/20 Hip Left 06/30/20  1105  -- 546            Intake/Output Last 24 hours No intake or output data in the 24 hours ending 12/28/21 0601  Labs/Imaging Results for orders placed or performed during the hospital encounter of 12/28/21 (from the past 48 hour(s))  CBC with Differential     Status: Abnormal   Collection Time: 12/28/21  1:50 AM  Result Value Ref Range   WBC 4.7 4.0 - 10.5 K/uL   RBC 3.66 (L) 3.87 - 5.11 MIL/uL   Hemoglobin 12.0 12.0 - 15.0 g/dL   HCT 36.7 36.0 - 46.0 %   MCV 100.3 (H) 80.0 - 100.0 fL   MCH 32.8 26.0 - 34.0 pg   MCHC 32.7 30.0 - 36.0 g/dL   RDW 13.1 11.5 - 15.5 %   Platelets 301 150 - 400 K/uL   nRBC 0.0 0.0 - 0.2 %   Neutrophils Relative % 42 %   Neutro Abs 2.0 1.7 - 7.7 K/uL   Lymphocytes Relative 38 %   Lymphs Abs 1.8 0.7 - 4.0 K/uL   Monocytes Relative 15 %   Monocytes Absolute 0.7  0.1 - 1.0 K/uL   Eosinophils Relative 4 %   Eosinophils Absolute 0.2 0.0 - 0.5 K/uL   Basophils Relative 1 %   Basophils Absolute 0.0 0.0 - 0.1 K/uL   Immature Granulocytes 0 %   Abs Immature Granulocytes 0.01 0.00 - 0.07 K/uL    Comment: Performed at [facility], [place] [address] [place], [place] [address]  I-stat chem 8, ED (not at [facility] or [facility])     Status: None   Collection Time: 12/28/21  2:15 AM  Result Value Ref Range   Sodium 140 135 - 145 mmol/L   Potassium 3.9 3.5 - 5.1 mmol/L   Chloride 103 98 - 111 mmol/L   BUN 19 8 - 23 mg/dL   Creatinine, Ser 1.00 0.44 - 1.00 mg/dL   Glucose, Bld 87 70 - 99 mg/dL    Comment: Glucose reference range applies only to samples taken after fasting for at least 8 hours.   Calcium, Ion 1.26 1.15 - 1.40 mmol/L   TCO2 25 22 - 32 mmol/L   Hemoglobin  12.6 12.0 - 15.0 g/dL   HCT 37.0 36.0 - 46.0 %  APTT     Status: None   Collection Time: 12/28/21  3:26 AM  Result Value Ref Range   aPTT 28 24 - 36 seconds    Comment: Performed at [facility], [place] [address], [place], [place] [address]  Protime-INR     Status: None   Collection Time: 12/28/21  3:26 AM  Result Value Ref Range   Prothrombin Time 12.5 11.4 - 15.2 seconds   INR 0.9 0.8 - 1.2    Comment: (NOTE) INR goal varies based on device and disease states. Performed at [facility], [place] [address], [place], [place] [address]    DG Hand Complete Right  Result Date: 12/28/2021 CLINICAL DATA:  Infection of index finger.  Pain and swelling. EXAM: RIGHT HAND - COMPLETE 3+ VIEW COMPARISON:  None Available. FINDINGS: No fracture. No dislocation. Multifocal osteoarthritis, most prominently affecting the thumb carpal metacarpal joint. There is also involvement of the second and third metacarpal phalangeal joints. No erosion, periosteal reaction, or bone destruction. Soft tissue edema of the index finger. No soft tissue gas. No radiopaque foreign body. IMPRESSION: 1. Soft tissue edema of the index finger. No soft tissue gas or radiographic findings of osteomyelitis. 2. Multifocal osteoarthritis, most prominently affecting the thumb carpometacarpal joint. Electronically Signed   By: Keith Rake M.D.   On: 12/28/2021 01:44    Pending Labs Unresulted Labs (From admission, onward)     Start     Ordered   12/29/21 0500  CBC  Daily,   R      12/28/21 0324   12/29/21 7425  Basic metabolic panel  Tomorrow morning,   R        12/28/21 0557   12/29/21 0500  CBC  Tomorrow morning,   R        12/28/21 0557   12/28/21 1000  Heparin level (unfractionated)  Once-Timed,   URGENT        12/28/21 0324   12/28/21 0556  HIV Antibody (routine testing w rflx)  (HIV Antibody (Routine testing w reflex) panel)  Once,   R        12/28/21 0557             Vitals/Pain Today's Vitals   12/28/21 0400 12/28/21 0415 12/28/21 0430 12/28/21 0457  BP: 126/62 131/63 131/69  Pulse: 60 (!) 59 (!) 58   Resp:      Temp:      SpO2: 99% 99% 99%   Weight:      Height:      PainSc:    2     Isolation Precautions No active isolations  Medications Medications  heparin bolus via infusion 2,000 Units (2,000 Units Intravenous Bolus from Bag 12/28/21 0345)    Followed by  heparin ADULT infusion 100 units/mL (25000 units/257m) (950 Units/hr Intravenous New Bag/Given 12/28/21 0344)  levothyroxine (SYNTHROID) tablet 50 mcg (has no administration in time range)  magnesium oxide (MAG-OX) tablet 400 mg (has no administration in time range)  Calcium Carbonate-Vitamin D 600-400 MG-UNIT 1 tablet (has no administration in time range)  loratadine (CLARITIN) tablet 10 mg (has no administration in time range)  cilostazol (PLETAL) tablet 100 mg (has no administration in time range)  acetaminophen (TYLENOL) tablet 650 mg (has no administration in time range)    Or  acetaminophen (TYLENOL) suppository 650 mg (has no administration in time range)  traZODone (DESYREL) tablet 25 mg (has no administration in time range)  magnesium hydroxide (MILK OF MAGNESIA) suspension 30 mL (has no administration in time range)  ondansetron (ZOFRAN) tablet 4 mg (has no administration in time range)    Or  ondansetron (ZOFRAN) injection 4 mg (has no administration in time range)  acetaminophen (TYLENOL) tablet 1,000 mg (1,000 mg Oral Given 12/28/21 0157)  nitroGLYCERIN (NITROGLYN) 2 % ointment 1 inch (1 inch Topical Given 12/28/21 0217)  fentaNYL (SUBLIMAZE) injection 50 mcg (50 mcg Intravenous Given 12/28/21 0324)    Mobility walks Low fall risk   Focused Assessments   R Recommendations: See Admitting Provider Note  Report given to:   Additional Notes:

## 2021-12-28 NOTE — Assessment & Plan Note (Addendum)
-   intact right radial pulse  - does have ulcer noted at distal tip of finger; etiology possibly ischemia vs some contribution of underlying Raynaud's  - s/p heparin drip on admission with improvement in color of finger - evaluated by hand surgery and vascular surgery  - underwent CTA chest/aorta prior to discharge

## 2021-12-28 NOTE — ED Triage Notes (Signed)
Pt presents to ED with c/o infection to right index finger x 4 weeks.Pt seen at [facility] 07/3 and placed on Septra DS. Pt denies missing doses, states infection as not gotten better. Index finger noted to be blue and pale upon triage.

## 2021-12-28 NOTE — Discharge Instructions (Signed)
Follow up with primary care to discuss further plans regarding 9 mm right lower lobe nodule noted on your CT chest.   Follow up with vascular surgery, Dr. Donzetta Matters

## 2021-12-28 NOTE — ED Notes (Signed)
Patient refused pain medications.  Finger feeling better.

## 2021-12-28 NOTE — Progress Notes (Signed)
Pt arrived via [facility]link from [facility] ED. No report was received from [facility]. [facility] provided information. Pt was not supposed to be brought to short stay, not scheduled for surgery today, verified this with [name] in [place]. Pt was supposed to go to [facility] ED but was turned down and pushed to short stay. Pt found to have a bed on 4N14. Report then called to Wells Guiles, RN. Tommy from [place] accompanied RN with pt to 4N. Bedside report and update given at bedside. Situation explained to pt as well. Pt has cellphone in the bed, all personal belongings at bedside.

## 2021-12-28 NOTE — ED Notes (Signed)
After pain medication, sats dropped to 84%, placed patient on 2/L O2 per Burna and MD notified.

## 2021-12-28 NOTE — Care Management Obs Status (Signed)
[facility] NOTIFICATION   Patient Details  Name: Tonya Ingram MRN: [id number] Date of Birth: Aug 08, 1950   Medicare Observation Status Notification Given:  Yes    Angelita Ingles, RN 12/28/2021, 3:07 PM

## 2021-12-29 NOTE — Consult Note (Signed)
Orthopedic Hand Surgery Consultation:  Reason for Consult: Right index finger ischemia Referring Physician: Dr. Sidney Ace   HPI: Tonya Ingram is a(an) 71 y.o. female with right index finger pain and discoloration. Pain and discoloration comes and goes, pain has worsened over last 4-5 weeks. No trauma to the finger. She does have history of Raynauds. Denies numbnes or tingling.    Physical Exam: Right Upper Extremity Index finger with blue/purple discoloration distal to PIP joint which has resolved and is now pink. This occurs intermittently. Pain at rest is present. SILT. BCR to tip of digit on exam.    Assessment/Plan: I examined the patient and the right index finger ischemia is resolved her finger was pink she is on heparin her pain is much better controlled she does have an ulcer to the tip of the finger along the ulnar aspect.  Her symptoms have been present for 4 to 5 weeks now they do come and go.  Her pain was severe last night and thus she was admitted.  Appreciate vascular surgery consultation who will obtain CTA for proximal vasculature to a ssess for possible source of embolus.  Possible traditional angiogram to the right hand to assess for vasculature to the digit itself which could potentially be performed on an outpatient basis.  No role for surgical intervention at this time the patient can follow-up with me on an outpatient basis.     Izell Otis, MD Orthopaedic Hand Surgeon [facility] Office number: [phone] [address], [address] [place], [place] [address]    Past Medical History:  Diagnosis Date   Arthritis    Depression    GERD (gastroesophageal reflux disease)    Hypothyroidism    Raynaud's disease    Thyroid disease     Past Surgical History:  Procedure Laterality Date   THYROIDECTOMY     TOTAL HIP ARTHROPLASTY Left 06/30/2020   Procedure: LEFT TOTAL HIP ARTHROPLASTY ANTERIOR APPROACH;  Surgeon: Mcarthur Rossetti, MD;  Location: [facility];   Service: Orthopedics;  Laterality: Left;   TUBAL LIGATION      Family History  Problem Relation Age of Onset   Parkinson's disease Mother    Arthritis Mother    Prostate cancer Father    Hypertension Father     Social History:  reports that she has never smoked. She has never used smokeless tobacco. She reports that she does not drink alcohol and does not use drugs.  Allergies:  Allergies  Allergen Reactions   Codeine Nausea And Vomiting    Medications: reviewed, no changes to patient's home medications  Results for orders placed or performed during the hospital encounter of 12/28/21 (from the past 48 hour(s))  CBC with Differential     Status: Abnormal   Collection Time: 12/28/21  1:50 AM  Result Value Ref Range   WBC 4.7 4.0 - 10.5 K/uL   RBC 3.66 (L) 3.87 - 5.11 MIL/uL   Hemoglobin 12.0 12.0 - 15.0 g/dL   HCT 36.7 36.0 - 46.0 %   MCV 100.3 (H) 80.0 - 100.0 fL   MCH 32.8 26.0 - 34.0 pg   MCHC 32.7 30.0 - 36.0 g/dL   RDW 13.1 11.5 - 15.5 %   Platelets 301 150 - 400 K/uL   nRBC 0.0 0.0 - 0.2 %   Neutrophils Relative % 42 %   Neutro Abs 2.0 1.7 - 7.7 K/uL   Lymphocytes Relative 38 %   Lymphs Abs 1.8 0.7 - 4.0 K/uL   Monocytes Relative  15 %   Monocytes Absolute 0.7 0.1 - 1.0 K/uL   Eosinophils Relative 4 %   Eosinophils Absolute 0.2 0.0 - 0.5 K/uL   Basophils Relative 1 %   Basophils Absolute 0.0 0.0 - 0.1 K/uL   Immature Granulocytes 0 %   Abs Immature Granulocytes 0.01 0.00 - 0.07 K/uL    Comment: Performed at [facility], [place] [address], [place], [place] [address]  I-stat chem 8, ED (not at [facility] or [facility])     Status: None   Collection Time: 12/28/21  2:15 AM  Result Value Ref Range   Sodium 140 135 - 145 mmol/L   Potassium 3.9 3.5 - 5.1 mmol/L   Chloride 103 98 - 111 mmol/L   BUN 19 8 - 23 mg/dL   Creatinine, Ser 1.00 0.44 - 1.00 mg/dL   Glucose, Bld 87 70 - 99 mg/dL    Comment: Glucose reference range applies only to samples taken after  fasting for at least 8 hours.   Calcium, Ion 1.26 1.15 - 1.40 mmol/L   TCO2 25 22 - 32 mmol/L   Hemoglobin 12.6 12.0 - 15.0 g/dL   HCT 37.0 36.0 - 46.0 %  APTT     Status: None   Collection Time: 12/28/21  3:26 AM  Result Value Ref Range   aPTT 28 24 - 36 seconds    Comment: Performed at [facility], [place] [address] [place], [place] [address]  Protime-INR     Status: None   Collection Time: 12/28/21  3:26 AM  Result Value Ref Range   Prothrombin Time 12.5 11.4 - 15.2 seconds   INR 0.9 0.8 - 1.2    Comment: (NOTE) INR goal varies based on device and disease states. Performed at [facility], [place] [address], [place], [place] [address]   HIV Antibody (routine testing w rflx)     Status: None   Collection Time: 12/28/21  8:54 AM  Result Value Ref Range   HIV Screen 4th Generation wRfx Non Reactive Non Reactive    Comment: Performed at [facility], [place] [address] [place], [place] [address]  Heparin level (unfractionated)     Status: None   Collection Time: 12/28/21  9:29 AM  Result Value Ref Range   Heparin Unfractionated 0.56 0.30 - 0.70 IU/mL    Comment: (NOTE) The clinical reportable range upper limit is being lowered to >1.10 to align with the FDA approved guidance for the current laboratory assay.  If heparin results are below expected values, and patient dosage has  been confirmed, suggest follow up testing of antithrombin III levels. Performed at [facility], [place] [address] [place],  [address]     CT ANGIO CHEST AORTA W/CM & OR WO/CM  Result Date: 12/28/2021 CLINICAL DATA:  Atherosclerosis.  Blue finger in the right arm. EXAM: CT ANGIOGRAPHY CHEST WITH CONTRAST TECHNIQUE: Multidetector CT imaging of the chest was performed using the standard protocol during bolus administration of intravenous contrast. Multiplanar CT image reconstructions and MIPs were obtained to evaluate the vascular anatomy. RADIATION DOSE  REDUCTION: This exam was performed according to the departmental dose-optimization program which includes automated exposure control, adjustment of the mA and/or kV according to patient size and/or use of iterative reconstruction technique. CONTRAST:  110m OMNIPAQUE IOHEXOL 350 MG/ML SOLN COMPARISON:  None Available. FINDINGS: Cardiovascular: Preferential opacification of the thoracic aorta. No evidence of thoracic aortic aneurysm or dissection. Normal heart size. No pericardial  effusion. No evidence for pulmonary embolism. No significant atherosclerotic disease identified. Mediastinum/Nodes: No enlarged mediastinal, hilar, or axillary lymph nodes. Thyroid gland, trachea, and esophagus demonstrate no significant findings. Lungs/Pleura: Subpleural ground-glass opacities are seen in the bilateral lower lobes. There is a nodular density in the right lung base/right lower lobe measuring 9 x 7 mm. No pleural effusion or pneumothorax. Trachea and central airways are within normal limits. Upper Abdomen: No acute abnormality. Musculoskeletal: No chest wall abnormality. No acute or significant osseous findings. Review of the MIP images confirms the above findings. IMPRESSION: 1. No acute aortic pathology identified. No significant atherosclerotic disease identified. 2. Subpleural ground-glass opacities in the bilateral lower lobes may be chronic scarring or related to infectious/inflammatory process. 3. 9 mm right lower lobe nodule. Consider one of the following in 3 months for both low-risk and high-risk individuals: (a) repeat chest CT, (b) follow-up PET-CT, or (c) tissue sampling. This recommendation follows the consensus statement: Guidelines for Management of Incidental Pulmonary Nodules Detected on CT Images: From the Fleischner Society 2017; Radiology 2017; 284:228-243. Electronically Signed   By: Ronney Asters M.D.   On: 12/28/2021 17:48   DG Hand Complete Right  Result Date: 12/28/2021 CLINICAL DATA:  Infection  of index finger.  Pain and swelling. EXAM: RIGHT HAND - COMPLETE 3+ VIEW COMPARISON:  None Available. FINDINGS: No fracture. No dislocation. Multifocal osteoarthritis, most prominently affecting the thumb carpal metacarpal joint. There is also involvement of the second and third metacarpal phalangeal joints. No erosion, periosteal reaction, or bone destruction. Soft tissue edema of the index finger. No soft tissue gas. No radiopaque foreign body. IMPRESSION: 1. Soft tissue edema of the index finger. No soft tissue gas or radiographic findings of osteomyelitis. 2. Multifocal osteoarthritis, most prominently affecting the thumb carpometacarpal joint. Electronically Signed   By: Keith Rake M.D.   On: 12/28/2021 01:44    ROS: 14 point review of systems negative except per HPI

## 2021-12-30 ENCOUNTER — Encounter (INDEPENDENT_AMBULATORY_CARE_PROVIDER_SITE_OTHER): Payer: Self-pay

## 2022-01-01 ENCOUNTER — Telehealth: Payer: Self-pay

## 2022-01-01 NOTE — Telephone Encounter (Signed)
Pt called stating that she was seen in the ED for an ischemic ulcer on her index finger. She had questions regarding the angiogram.  Reviewed pt's chart, returned pt's call, two identifiers used. Pt had CT done showing no vascular abnormalities. Dr Donzetta Matters noted proceeding with an angiogram if CT was normal for diagnostic purposes. Pt wanted to proceed with angiogram instead of having an office visit first. Explained that he would need to see her in the office first, go over surgery, answer questions, and then schedule procedure. Confirmed understanding.

## 2022-01-03 ENCOUNTER — Telehealth: Payer: Self-pay

## 2022-01-03 DIAGNOSIS — M79644 Pain in right finger(s): Secondary | ICD-10-CM | POA: Diagnosis not present

## 2022-01-03 DIAGNOSIS — I38 Endocarditis, valve unspecified: Secondary | ICD-10-CM | POA: Diagnosis not present

## 2022-01-03 DIAGNOSIS — L089 Local infection of the skin and subcutaneous tissue, unspecified: Secondary | ICD-10-CM | POA: Diagnosis not present

## 2022-01-03 DIAGNOSIS — R911 Solitary pulmonary nodule: Secondary | ICD-10-CM | POA: Diagnosis not present

## 2022-01-03 NOTE — Telephone Encounter (Signed)
NOTES SCANNED TO REFERRAL 

## 2022-01-09 ENCOUNTER — Ambulatory Visit (INDEPENDENT_AMBULATORY_CARE_PROVIDER_SITE_OTHER): Payer: No Typology Code available for payment source | Admitting: Vascular Surgery

## 2022-01-09 ENCOUNTER — Other Ambulatory Visit: Payer: Self-pay | Admitting: Sports Medicine

## 2022-01-09 VITALS — BP 130/69 | HR 63 | Temp 97.8°F | Resp 18 | Ht 65.0 in | Wt 126.0 lb

## 2022-01-09 DIAGNOSIS — I7301 Raynaud's syndrome with gangrene: Secondary | ICD-10-CM | POA: Diagnosis not present

## 2022-01-09 MED ORDER — TRAMADOL HCL 50 MG PO TABS: 50.0000 mg | ORAL_TABLET | Freq: Four times a day (QID) | ORAL | 0 refills | Status: DC | PRN

## 2022-01-09 NOTE — Progress Notes (Signed)
Patient ID: Tonya Ingram, female   DOB: [birth year], 71 y.o.   MRN: [id number]  Reason for Consult: New Patient (Initial Visit)   Referred by Caren Macadam, MD  Subjective:     HPI:  Tonya Ingram is a 71 y.o. female history of Raynaud's syndrome and recently has a now 6-week history of index finger wound which is very painful and has failed to heal.  She was recently in the hospital underwent CT scanning and then was discharged home prior to discussion.  Who follows up today for further evaluation without any additional studies.  Past Medical History:  Diagnosis Date   Arthritis    Depression    GERD (gastroesophageal reflux disease)    Hypothyroidism    Raynaud's disease    Thyroid disease    Family History  Problem Relation Age of Onset   Parkinson's disease Mother    Arthritis Mother    Prostate cancer Father    Hypertension Father    Past Surgical History:  Procedure Laterality Date   THYROIDECTOMY     TOTAL HIP ARTHROPLASTY Left 06/30/2020   Procedure: LEFT TOTAL HIP ARTHROPLASTY ANTERIOR APPROACH;  Surgeon: Mcarthur Rossetti, MD;  Location: [facility];  Service: Orthopedics;  Laterality: Left;   TUBAL LIGATION      Short Social History:  Social History   Tobacco Use   Smoking status: Never   Smokeless tobacco: Never  Substance Use Topics   Alcohol use: Never    Allergies  Allergen Reactions   Codeine Nausea And Vomiting    Current Outpatient Medications  Medication Sig Dispense Refill   acetaminophen (TYLENOL) 500 MG tablet Take 2 tablets (1,000 mg total) by mouth 3 (three) times daily as needed for mild pain. 30 tablet 0   aspirin EC (ASPIRIN 81) 81 MG tablet Take 1 tablet (81 mg total) by mouth daily. Swallow whole.     Calcium Carb-Cholecalciferol (CALCIUM + VITAMIN D3 PO) Take 1 tablet by mouth daily.     cetirizine (ZYRTEC) 10 MG tablet Take 10 mg by mouth daily.     levothyroxine (SYNTHROID) 50 MCG tablet Take 50 mcg by mouth daily before  breakfast.     MAGNESIUM PO Take 1 tablet by mouth daily.     meclizine (ANTIVERT) 25 MG tablet Take 25 mg by mouth 3 (three) times daily as needed for dizziness (vertigo).     NONFORMULARY OR COMPOUNDED ITEM Apply 1 application topically daily. Hormone Cream : estrogen and progesterone mixed     traMADol (ULTRAM) 50 MG tablet Take 1 tablet (50 mg total) by mouth every 6 (six) hours as needed. 30 tablet 0   valACYclovir (VALTREX) 500 MG tablet Take 500 mg by mouth daily.     No current facility-administered medications for this visit.    Review of Systems  Constitutional:  Constitutional negative. HENT: HENT negative.  Eyes: Eyes negative.  GI: Gastrointestinal negative.  GU: Genitourinary negative. Musculoskeletal: Positive for back pain.  Skin: Positive for wound.  Neurological: Neurological negative. Hematologic: Hematologic/lymphatic negative.  Psychiatric: Psychiatric negative.        Objective:  Objective   Vitals:   01/09/22 1408  BP: 130/69  Pulse: 63  Resp: 18  Temp: 97.8 F (36.6 C)  SpO2: 90%  Weight: 126 lb (57.2 kg)  Height: '5\' 5"'$  (1.651 m)   Body mass index is 20.97 kg/m.  Physical Exam HENT:     Head: Normocephalic.     Nose: Nose normal.  Eyes:  Pupils: Pupils are equal, round, and reactive to light.  Cardiovascular:     Pulses:          Radial pulses are 2+ on the right side and 2+ on the left side.     Comments: Right ulnar pulses palpable I can trace both the radial and ulnar signals on the hand but cannot trace a complete palmar arch Pulmonary:     Effort: Pulmonary effort is normal.  Abdominal:     General: Abdomen is flat.     Palpations: Abdomen is soft.  Musculoskeletal:     Comments: All fingers on her hand have purple discoloration which she states is due to temperature in the room she does have nonhealing ulceration of the index finger  Neurological:     Mental Status: She is alert.     Data: CT IMPRESSION: 1. No acute  aortic pathology identified. No significant atherosclerotic disease identified. 2. Subpleural ground-glass opacities in the bilateral lower lobes may be chronic scarring or related to infectious/inflammatory process. 3. 9 mm right lower lobe nodule. Consider one of the following in 3 months for both low-risk and high-risk individuals: (a) repeat chest CT, (b) follow-up PET-CT, or (c) tissue sampling. This recommendation follows the consensus statement: Guidelines for Management of Incidental Pulmonary Nodules Detected on CT Images: From the Fleischner Society 2017; Radiology 2017; 284:228-243.     Assessment/Plan:    71 year old female with painful wound on the right index finger that does not appear to be vascular related.  I discussed with hand surgeon for need for finger amputation which she will also discussed with him and she can follow-up with me on an as-needed basis.     Waynetta Sandy MD [facility]

## 2022-01-13 ENCOUNTER — Other Ambulatory Visit: Payer: Self-pay

## 2022-01-13 ENCOUNTER — Encounter (HOSPITAL_COMMUNITY): Payer: Self-pay | Admitting: Emergency Medicine

## 2022-01-13 ENCOUNTER — Emergency Department (HOSPITAL_COMMUNITY)
Admission: EM | Admit: 2022-01-13 | Discharge: 2022-01-13 | Discharge status: Against Medical Advice | Payer: No Typology Code available for payment source | Attending: Emergency Medicine | Admitting: Emergency Medicine

## 2022-01-13 DIAGNOSIS — M79604 Pain in right leg: Secondary | ICD-10-CM | POA: Diagnosis not present

## 2022-01-13 DIAGNOSIS — M79605 Pain in left leg: Secondary | ICD-10-CM | POA: Insufficient documentation

## 2022-01-13 DIAGNOSIS — Z5321 Procedure and treatment not carried out due to patient leaving prior to being seen by health care provider: Secondary | ICD-10-CM | POA: Insufficient documentation

## 2022-01-13 DIAGNOSIS — G8929 Other chronic pain: Secondary | ICD-10-CM | POA: Insufficient documentation

## 2022-01-13 LAB — CBC WITH DIFFERENTIAL/PLATELET
Abs Immature Granulocytes: 0.01 10*3/uL (ref 0.00–0.07)
Basophils Absolute: 0 10*3/uL (ref 0.0–0.1)
Basophils Relative: 0 %
Eosinophils Absolute: 0.2 10*3/uL (ref 0.0–0.5)
Eosinophils Relative: 4 %
HCT: 38.6 % (ref 36.0–46.0)
Hemoglobin: 12.3 g/dL (ref 12.0–15.0)
Immature Granulocytes: 0 %
Lymphocytes Relative: 40 %
Lymphs Abs: 1.8 10*3/uL (ref 0.7–4.0)
MCH: 32.3 pg (ref 26.0–34.0)
MCHC: 31.9 g/dL (ref 30.0–36.0)
MCV: 101.3 fL — ABNORMAL HIGH (ref 80.0–100.0)
Monocytes Absolute: 0.6 10*3/uL (ref 0.1–1.0)
Monocytes Relative: 13 %
Neutro Abs: 1.9 10*3/uL (ref 1.7–7.7)
Neutrophils Relative %: 43 %
Platelets: 318 10*3/uL (ref 150–400)
RBC: 3.81 MIL/uL — ABNORMAL LOW (ref 3.87–5.11)
RDW: 12.4 % (ref 11.5–15.5)
WBC: 4.5 10*3/uL (ref 4.0–10.5)
nRBC: 0 % (ref 0.0–0.2)

## 2022-01-13 LAB — BASIC METABOLIC PANEL
Anion gap: 8 (ref 5–15)
BUN: 19 mg/dL (ref 8–23)
CO2: 24 mmol/L (ref 22–32)
Calcium: 9.2 mg/dL (ref 8.9–10.3)
Chloride: 106 mmol/L (ref 98–111)
Creatinine, Ser: 0.85 mg/dL (ref 0.44–1.00)
GFR, Estimated: >60 mL/min (ref >60)
Glucose, Bld: 96 mg/dL (ref 70–99)
Potassium: 4.4 mmol/L (ref 3.5–5.1)
Sodium: 138 mmol/L (ref 135–145)

## 2022-01-13 NOTE — ED Notes (Signed)
Pt family member alerted nurse tech that pt has taken 16 '600mg'$  Tylenol within the last 24 hours. Triage nurse notified.

## 2022-01-13 NOTE — ED Provider Triage Note (Signed)
Emergency Medicine Provider Triage Evaluation Note  Tonya Ingram , a 71 y.o. female  was evaluated in triage.  Pt complains of concerns for increasing pain to her chronic bilateral leg pain.  Has a history of back pain with a back surgery scheduled for August 28 with Dr. Ellene Route.  Has been receiving injections to her spine bilaterally for her pain.  Has been taking tramadol and Tylenol for symptoms.  Denies bowel/bladder incontinence, saddle paresthesia, new numbness or weakness.   Also notes an ulcer to the right first digit that she is to have amputated by her hand surgeon.  Review of Systems  Positive: As per HPI Negative:   Physical Exam  BP (!) 159/72 (BP Location: Right Arm)   Pulse (!) 57   Temp 98.4 F (36.9 C) (Oral)   Resp 18   SpO2 98%  Gen:   Awake, no distress   Resp:  Normal effort  MSK:   Moves extremities without difficulty  Other:  No spinal tenderness to palpation.  Positive straight leg raise noted bilaterally.  Able to ambulate without assistance or difficulty.  Ulceration noted to the lateral aspect of the first digit nailbed without drainage or erythema surrounding.  Capillary refill intact.  Medical Decision Making  Medically screening exam initiated at 8:35 PM.  Appropriate orders placed.  Tonya Ingram was informed that the remainder of the evaluation will be completed by another provider, this initial triage assessment does not replace that evaluation, and the importance of remaining in the ED until their evaluation is complete.  Work-up initiated   Georganne Siple A, PA-C 01/13/22 2037

## 2022-01-13 NOTE — ED Notes (Signed)
Pt left ama due to long wait times.  

## 2022-01-13 NOTE — ED Triage Notes (Signed)
Patient reports chronic bilateral legs pain for 2 years worse today , denies injury , she adds infected right distal index finger for several weeks , scheduled for back surgery by Dr. Ellene Route on Aug . 28 this year .

## 2022-01-14 ENCOUNTER — Other Ambulatory Visit: Payer: Self-pay | Admitting: Physician Assistant

## 2022-01-14 ENCOUNTER — Other Ambulatory Visit (HOSPITAL_COMMUNITY): Payer: Self-pay | Admitting: Physician Assistant

## 2022-01-14 ENCOUNTER — Other Ambulatory Visit: Payer: Self-pay

## 2022-01-14 ENCOUNTER — Other Ambulatory Visit: Payer: Self-pay | Admitting: Sports Medicine

## 2022-01-14 DIAGNOSIS — M5416 Radiculopathy, lumbar region: Secondary | ICD-10-CM

## 2022-01-14 DIAGNOSIS — R911 Solitary pulmonary nodule: Secondary | ICD-10-CM

## 2022-01-14 NOTE — Progress Notes (Signed)
Pt called requesting order for repeat lumbar ESI. Dr. Ellene Route has cleared this request as well.  Order placed.

## 2022-01-16 ENCOUNTER — Other Ambulatory Visit: Payer: Self-pay

## 2022-01-16 ENCOUNTER — Encounter (HOSPITAL_COMMUNITY): Payer: Self-pay | Admitting: Orthopedic Surgery

## 2022-01-16 NOTE — Progress Notes (Signed)
PCP - Dr Redmond School Cardiologist - n/a  CT Chest x-ray - 12/28/21 EKG - 12/28/21 Stress Test - n/a ECHO - n/a Cardiac Cath - n/a  ICD Pacemaker/Loop - none  Sleep Study -  n/a CPAP - none  Aspirin Instructions: Follow your surgeon's instructions on when to stop aspirin prior to surgery,  If no instructions were given by your surgeon then you will need to call the office for those instructions.  ERAS: Per MD, water til 12 Noon per patient.  STOP now taking any Aspirin (unless otherwise instructed by your surgeon), Aleve, Naproxen, Ibuprofen, Motrin, Advil, Goody's, BC's, all herbal medications, fish oil, and all vitamins.   Coronavirus Screening Do you have any of the following symptoms:  Cough yes/no: No Fever (>100.6F)  yes/no: No Runny nose yes/no: No Sore throat yes/no: No Difficulty breathing/shortness of breath  yes/no: No  Have you traveled in the last 14 days and where? yes/no: No  Patient verbalized understanding of instructions that were given via phone.

## 2022-01-16 NOTE — Anesthesia Preprocedure Evaluation (Signed)
Anesthesia Evaluation  Patient identified by MRN, date of birth, ID band Patient awake    Reviewed: Allergy & Precautions, Patient's Chart, lab work & pertinent test results  Airway Mallampati: II  TM Distance: >3 FB     Dental   Pulmonary neg pulmonary ROS,    breath sounds clear to auscultation       Cardiovascular + Peripheral Vascular Disease   Rhythm:Regular Rate:Normal     Neuro/Psych PSYCHIATRIC DISORDERS Depression negative neurological ROS     GI/Hepatic Neg liver ROS, GERD  ,  Endo/Other  Hypothyroidism   Renal/GU negative Renal ROS  negative genitourinary   Musculoskeletal  (+) Arthritis , Osteoarthritis,    Abdominal   Peds  Hematology negative hematology ROS (+) Hb 12.3   Anesthesia Other Findings   Reproductive/Obstetrics negative OB ROS                            Anesthesia Physical Anesthesia Plan  ASA: 2  Anesthesia Plan: MAC   Post-op Pain Management: Tylenol PO (pre-op)*   Induction: Intravenous  PONV Risk Score and Plan: 2 and Propofol infusion and TIVA  Airway Management Planned: Natural Airway and Simple Face Mask  Additional Equipment: None  Intra-op Plan:   Post-operative Plan:   Informed Consent: I have reviewed the patients History and Physical, chart, labs and discussed the procedure including the risks, benefits and alternatives for the proposed anesthesia with the patient or authorized representative who has indicated his/her understanding and acceptance.     Dental advisory given  Plan Discussed with: CRNA and Anesthesiologist  Anesthesia Plan Comments:       Anesthesia Quick Evaluation

## 2022-01-17 ENCOUNTER — Encounter (HOSPITAL_COMMUNITY)
Admission: RE | Disposition: A | Discharge status: Home / Self Care | Payer: Self-pay | Source: Home / Self Care | Attending: Orthopedic Surgery

## 2022-01-17 ENCOUNTER — Ambulatory Visit (HOSPITAL_COMMUNITY): Payer: No Typology Code available for payment source | Admitting: Anesthesiology

## 2022-01-17 ENCOUNTER — Ambulatory Visit (HOSPITAL_BASED_OUTPATIENT_CLINIC_OR_DEPARTMENT_OTHER): Payer: No Typology Code available for payment source | Admitting: Anesthesiology

## 2022-01-17 ENCOUNTER — Other Ambulatory Visit: Payer: Self-pay

## 2022-01-17 ENCOUNTER — Ambulatory Visit (HOSPITAL_COMMUNITY)
Admission: RE | Admit: 2022-01-17 | Discharge: 2022-01-17 | Disposition: A | Discharge status: Home / Self Care | Payer: No Typology Code available for payment source | Attending: Orthopedic Surgery | Admitting: Orthopedic Surgery

## 2022-01-17 DIAGNOSIS — K219 Gastro-esophageal reflux disease without esophagitis: Secondary | ICD-10-CM | POA: Diagnosis not present

## 2022-01-17 DIAGNOSIS — I998 Other disorder of circulatory system: Secondary | ICD-10-CM

## 2022-01-17 DIAGNOSIS — I70229 Atherosclerosis of native arteries of extremities with rest pain, unspecified extremity: Secondary | ICD-10-CM | POA: Insufficient documentation

## 2022-01-17 DIAGNOSIS — E039 Hypothyroidism, unspecified: Secondary | ICD-10-CM | POA: Diagnosis not present

## 2022-01-17 DIAGNOSIS — M87044 Idiopathic aseptic necrosis of right finger(s): Secondary | ICD-10-CM | POA: Diagnosis not present

## 2022-01-17 DIAGNOSIS — M1990 Unspecified osteoarthritis, unspecified site: Secondary | ICD-10-CM | POA: Diagnosis not present

## 2022-01-17 DIAGNOSIS — I739 Peripheral vascular disease, unspecified: Secondary | ICD-10-CM | POA: Diagnosis not present

## 2022-01-17 HISTORY — PX: AMPUTATION: SHX166

## 2022-01-17 HISTORY — DX: Herpesviral infection, unspecified: B00.9

## 2022-01-17 HISTORY — DX: Cardiac murmur, unspecified: R01.1

## 2022-01-17 LAB — HEPATIC FUNCTION PANEL
ALT: 17 U/L (ref 0–44)
AST: 25 U/L (ref 15–41)
Albumin: 3.9 g/dL (ref 3.5–5.0)
Alkaline Phosphatase: 46 U/L (ref 38–126)
Bilirubin, Direct: <0.1 mg/dL (ref 0.0–0.2)
Total Bilirubin: 0.3 mg/dL (ref 0.3–1.2)
Total Protein: 6.5 g/dL (ref 6.5–8.1)

## 2022-01-17 SURGERY — AMPUTATION DIGIT
Anesthesia: Monitor Anesthesia Care | Site: Hand | Laterality: Right

## 2022-01-17 MED ORDER — PROPOFOL 500 MG/50ML IV EMUL
INTRAVENOUS | Status: DC | PRN
  Administered 2022-01-17: 125 ug/kg/min via INTRAVENOUS

## 2022-01-17 MED ORDER — PHENYLEPHRINE 80 MCG/ML (10ML) SYRINGE FOR IV PUSH (FOR BLOOD PRESSURE SUPPORT)
PREFILLED_SYRINGE | INTRAVENOUS | Status: AC
  Filled 2022-01-17: qty 20

## 2022-01-17 MED ORDER — SODIUM CHLORIDE 0.9 % IR SOLN
Status: DC | PRN
  Administered 2022-01-17: 1000 mL

## 2022-01-17 MED ORDER — DEXAMETHASONE SODIUM PHOSPHATE 10 MG/ML IJ SOLN
INTRAMUSCULAR | Status: AC
  Filled 2022-01-17: qty 1

## 2022-01-17 MED ORDER — BACITRACIN ZINC 500 UNIT/GM EX OINT
TOPICAL_OINTMENT | CUTANEOUS | Status: DC | PRN
  Administered 2022-01-17: 1 via TOPICAL

## 2022-01-17 MED ORDER — MIDAZOLAM HCL 2 MG/2ML IJ SOLN
INTRAMUSCULAR | Status: AC
  Filled 2022-01-17: qty 2

## 2022-01-17 MED ORDER — CHLORHEXIDINE GLUCONATE 0.12 % MT SOLN
15.0000 mL | Freq: Once | OROMUCOSAL | Status: AC
  Administered 2022-01-17: 15 mL via OROMUCOSAL

## 2022-01-17 MED ORDER — ONDANSETRON HCL 4 MG/2ML IJ SOLN
INTRAMUSCULAR | Status: AC
  Filled 2022-01-17: qty 2

## 2022-01-17 MED ORDER — HYDROCODONE-ACETAMINOPHEN 5-325 MG PO TABS: 1.0000 | ORAL_TABLET | Freq: Four times a day (QID) | ORAL | 0 refills | Status: AC | PRN

## 2022-01-17 MED ORDER — MIDAZOLAM HCL 2 MG/2ML IJ SOLN
INTRAMUSCULAR | Status: DC | PRN
  Administered 2022-01-17: 1 mg via INTRAVENOUS

## 2022-01-17 MED ORDER — BUPIVACAINE HCL (PF) 0.25 % IJ SOLN
INTRAMUSCULAR | Status: DC | PRN
  Administered 2022-01-17: 10 mL

## 2022-01-17 MED ORDER — ACETAMINOPHEN 500 MG PO TABS: 1000.0000 mg | ORAL_TABLET | Freq: Once | ORAL | Status: DC

## 2022-01-17 MED ORDER — ORAL CARE MOUTH RINSE: 15.0000 mL | Freq: Once | OROMUCOSAL | Status: AC

## 2022-01-17 MED ORDER — FENTANYL CITRATE (PF) 250 MCG/5ML IJ SOLN
INTRAMUSCULAR | Status: AC
  Filled 2022-01-17: qty 5

## 2022-01-17 MED ORDER — BUPIVACAINE HCL (PF) 0.25 % IJ SOLN
INTRAMUSCULAR | Status: AC
  Filled 2022-01-17: qty 30

## 2022-01-17 MED ORDER — PROPOFOL 10 MG/ML IV BOLUS
INTRAVENOUS | Status: DC | PRN
  Administered 2022-01-17: 15 mg via INTRAVENOUS
  Administered 2022-01-17: 10 mg via INTRAVENOUS
  Administered 2022-01-17: 5 mg via INTRAVENOUS
  Administered 2022-01-17: 10 mg via INTRAVENOUS

## 2022-01-17 MED ORDER — LACTATED RINGERS IV SOLN
INTRAVENOUS | Status: DC
Start: 2022-01-17 — End: 2022-01-17

## 2022-01-17 MED ORDER — CEFAZOLIN SODIUM-DEXTROSE 2-3 GM-%(50ML) IV SOLR
INTRAVENOUS | Status: DC | PRN
  Administered 2022-01-17: 2 g via INTRAVENOUS

## 2022-01-17 MED ORDER — LIDOCAINE 2% (20 MG/ML) 5 ML SYRINGE
INTRAMUSCULAR | Status: AC
  Filled 2022-01-17: qty 15

## 2022-01-17 MED ORDER — EPHEDRINE 5 MG/ML INJ
INTRAVENOUS | Status: AC
  Filled 2022-01-17: qty 5

## 2022-01-17 MED ORDER — ONDANSETRON HCL 4 MG/2ML IJ SOLN
INTRAMUSCULAR | Status: DC | PRN
  Administered 2022-01-17: 4 mg via INTRAVENOUS

## 2022-01-17 MED ORDER — BACITRACIN ZINC 500 UNIT/GM EX OINT
TOPICAL_OINTMENT | CUTANEOUS | Status: AC
  Filled 2022-01-17: qty 28.35

## 2022-01-17 SURGICAL SUPPLY — 45 items
BAG COUNTER SPONGE SURGICOUNT (BAG) ×2 IMPLANT
BNDG COHESIVE 1X5 TAN STRL LF (GAUZE/BANDAGES/DRESSINGS) ×1 IMPLANT
BNDG ELASTIC 3X5.8 VLCR STR LF (GAUZE/BANDAGES/DRESSINGS) ×2 IMPLANT
BNDG ELASTIC 4X5.8 VLCR STR LF (GAUZE/BANDAGES/DRESSINGS) ×2 IMPLANT
BNDG ESMARK 4X9 LF (GAUZE/BANDAGES/DRESSINGS) ×2 IMPLANT
BNDG STRETCH 4X75 NS LF (GAUZE/BANDAGES/DRESSINGS) ×1 IMPLANT
CORD BIPOLAR FORCEPS 12FT (ELECTRODE) ×3 IMPLANT
COVER SURGICAL LIGHT HANDLE (MISCELLANEOUS) ×2 IMPLANT
CUFF TOURN SGL QUICK 18X4 (TOURNIQUET CUFF) ×2 IMPLANT
DRAIN PENROSE 0.75X12 (DRAIN) ×1 IMPLANT
DRAPE SURG 17X23 STRL (DRAPES) ×2 IMPLANT
DRAPE U-SHAPE 47X51 STRL (DRAPES) ×1 IMPLANT
DRSG ADAPTIC 3X8 NADH LF (GAUZE/BANDAGES/DRESSINGS) ×1 IMPLANT
ELECT REM PT RETURN 9FT ADLT (ELECTROSURGICAL) ×2
ELECTRODE REM PT RTRN 9FT ADLT (ELECTROSURGICAL) IMPLANT
GAUZE SPONGE 4X4 12PLY STRL (GAUZE/BANDAGES/DRESSINGS) ×1 IMPLANT
GLOVE BIOGEL PI IND STRL 8.5 (GLOVE) ×1 IMPLANT
GLOVE BIOGEL PI INDICATOR 8.5 (GLOVE) ×1
GLOVE SURG ORTHO 8.0 STRL STRW (GLOVE) ×2 IMPLANT
GLOVE SURG UNDER POLY LF SZ7.5 (GLOVE) ×4 IMPLANT
GOWN STRL REUS W/ TWL LRG LVL3 (GOWN DISPOSABLE) ×3 IMPLANT
GOWN STRL REUS W/ TWL XL LVL3 (GOWN DISPOSABLE) ×1 IMPLANT
GOWN STRL REUS W/TWL LRG LVL3 (GOWN DISPOSABLE) ×1
GOWN STRL REUS W/TWL XL LVL3 (GOWN DISPOSABLE) ×1
KIT BASIN OR (CUSTOM PROCEDURE TRAY) ×2 IMPLANT
KIT TURNOVER KIT B (KITS) ×2 IMPLANT
NDL RETROBULBAR 25GX1.5 (NEEDLE) IMPLANT
NDL SAFETY ECLIPSE 18X1.5 (NEEDLE) IMPLANT
NEEDLE HYPO 18GX1.5 SHARP (NEEDLE) ×1
NEEDLE RETROBULBAR 25GX1.5 (NEEDLE) ×2 IMPLANT
NS IRRIG 1000ML POUR BTL (IV SOLUTION) ×2 IMPLANT
PACK ORTHO EXTREMITY (CUSTOM PROCEDURE TRAY) ×2 IMPLANT
PAD ARMBOARD 7.5X6 YLW CONV (MISCELLANEOUS) ×4 IMPLANT
SOAP 2 % CHG 4 OZ (WOUND CARE) ×2 IMPLANT
SPONGE T-LAP 18X18 ~~LOC~~+RFID (SPONGE) ×2 IMPLANT
SPONGE T-LAP 4X18 ~~LOC~~+RFID (SPONGE) ×2 IMPLANT
SUT CHROMIC 4 0 RB 1X27 (SUTURE) ×2 IMPLANT
SWAB COLLECTION DEVICE MRSA (MISCELLANEOUS) ×2 IMPLANT
SYR CONTROL 10ML LL (SYRINGE) ×1 IMPLANT
TOWEL GREEN STERILE (TOWEL DISPOSABLE) ×2 IMPLANT
TOWEL GREEN STERILE FF (TOWEL DISPOSABLE) ×2 IMPLANT
TUBE CONNECTING 12X1/4 (SUCTIONS) ×2 IMPLANT
UNDERPAD 30X36 HEAVY ABSORB (UNDERPADS AND DIAPERS) ×2 IMPLANT
WATER STERILE IRR 1000ML POUR (IV SOLUTION) ×2 IMPLANT
YANKAUER SUCT BULB TIP NO VENT (SUCTIONS) ×2 IMPLANT

## 2022-01-17 NOTE — H&P (Signed)
Preoperative History & Physical Exam  Surgeon: Matt Holmes, MD  Diagnosis: Right index finger ischemia  Planned Procedure: Procedure(s) (LRB): Right index finger partial amputation (Right)  History of Present Illness:   Patient is a 71 y.o. female with symptoms consistent with Right index finger ischemia who presents for surgical intervention. The risks, benefits and alternatives of surgical intervention were discussed and informed consent was obtained prior to surgery.  Past Medical History:  Past Medical History:  Diagnosis Date   Arthritis    Depression    GERD (gastroesophageal reflux disease)    Heart murmur    never has caused problems   HSV infection    valtrex   Hypothyroidism    Raynaud's disease     Past Surgical History:  Past Surgical History:  Procedure Laterality Date   THYROIDECTOMY     TOTAL HIP ARTHROPLASTY Left 06/30/2020   Procedure: LEFT TOTAL HIP ARTHROPLASTY ANTERIOR APPROACH;  Surgeon: Mcarthur Rossetti, MD;  Location: [facility];  Service: Orthopedics;  Laterality: Left;   TUBAL LIGATION     WISDOM TOOTH EXTRACTION      Medications:  Prior to Admission medications   Medication Sig Start Date End Date Taking? Authorizing Provider  acetaminophen (TYLENOL) 500 MG tablet Take 2 tablets (1,000 mg total) by mouth 3 (three) times daily as needed for mild pain. 12/28/21  Yes Dwyane Dee, MD  aspirin EC (ASPIRIN 81) 81 MG tablet Take 1 tablet (81 mg total) by mouth daily. Swallow whole. 12/28/21  Yes Dwyane Dee, MD  Calcium Carb-Cholecalciferol (CALCIUM + VITAMIN D3 PO) Take 1 tablet by mouth daily.   Yes [provider]  cetirizine (ZYRTEC) 10 MG tablet Take 10 mg by mouth daily.   Yes [provider]  levothyroxine (SYNTHROID) 50 MCG tablet Take 50 mcg by mouth daily before breakfast.   Yes [provider]  MAGNESIUM PO Take 400 mg by mouth daily.   Yes [provider]  meclizine (ANTIVERT) 25 MG tablet Take  25 mg by mouth 3 (three) times daily as needed for dizziness (vertigo).   Yes [provider]  NONFORMULARY OR COMPOUNDED ITEM Apply 1 application topically daily. Hormone Cream : estrogen and progesterone mixed   Yes [provider]  traMADol (ULTRAM) 50 MG tablet Take 1 tablet (50 mg total) by mouth every 6 (six) hours as needed. 01/09/22  Yes Draper, Carlos Levering, DO  valACYclovir (VALTREX) 500 MG tablet Take 500 mg by mouth daily. 12/01/21  Yes [provider]    Allergies:  Codeine  Review of Systems: Negative except per HPI.  Physical Exam: Alert and oriented, NAD Head and neck: no masses, normal alignment CV: pulse intact Pulm: no increased work of breathing, respirations even and unlabored Abdomen: non-distended Extremities: extremities warm and well perfused  LABS: Recent Results (from the past 2160 hour(s))  CBC with Differential     Status: Abnormal   Collection Time: 12/28/21  1:50 AM  Result Value Ref Range   WBC 4.7 4.0 - 10.5 K/uL   RBC 3.66 (L) 3.87 - 5.11 MIL/uL   Hemoglobin 12.0 12.0 - 15.0 g/dL   HCT 36.7 36.0 - 46.0 %   MCV 100.3 (H) 80.0 - 100.0 fL   MCH 32.8 26.0 - 34.0 pg   MCHC 32.7 30.0 - 36.0 g/dL   RDW 13.1 11.5 - 15.5 %   Platelets 301 150 - 400 K/uL   nRBC 0.0 0.0 - 0.2 %   Neutrophils Relative %  42 %   Neutro Abs 2.0 1.7 - 7.7 K/uL   Lymphocytes Relative 38 %   Lymphs Abs 1.8 0.7 - 4.0 K/uL   Monocytes Relative 15 %   Monocytes Absolute 0.7 0.1 - 1.0 K/uL   Eosinophils Relative 4 %   Eosinophils Absolute 0.2 0.0 - 0.5 K/uL   Basophils Relative 1 %   Basophils Absolute 0.0 0.0 - 0.1 K/uL   Immature Granulocytes 0 %   Abs Immature Granulocytes 0.01 0.00 - 0.07 K/uL    Comment: Performed at [facility], [place] [address], [place], [place] [address]  I-stat chem 8, ED (not at [facility] or [facility])     Status: None   Collection Time: 12/28/21  2:15 AM  Result Value Ref Range   Sodium 140 135 - 145 mmol/L    Potassium 3.9 3.5 - 5.1 mmol/L   Chloride 103 98 - 111 mmol/L   BUN 19 8 - 23 mg/dL   Creatinine, Ser 1.00 0.44 - 1.00 mg/dL   Glucose, Bld 87 70 - 99 mg/dL    Comment: Glucose reference range applies only to samples taken after fasting for at least 8 hours.   Calcium, Ion 1.26 1.15 - 1.40 mmol/L   TCO2 25 22 - 32 mmol/L   Hemoglobin 12.6 12.0 - 15.0 g/dL   HCT 37.0 36.0 - 46.0 %  APTT     Status: None   Collection Time: 12/28/21  3:26 AM  Result Value Ref Range   aPTT 28 24 - 36 seconds    Comment: Performed at [facility], [place] [address], [place], [place] [address]  Protime-INR     Status: None   Collection Time: 12/28/21  3:26 AM  Result Value Ref Range   Prothrombin Time 12.5 11.4 - 15.2 seconds   INR 0.9 0.8 - 1.2    Comment: (NOTE) INR goal varies based on device and disease states. Performed at [facility], [place] [address] [place], [place] [address]   HIV Antibody (routine testing w rflx)     Status: None   Collection Time: 12/28/21  8:54 AM  Result Value Ref Range   HIV Screen 4th Generation wRfx Non Reactive Non Reactive    Comment: Performed at [facility], [place] [address] [place], [place] [address]  Heparin level (unfractionated)     Status: None   Collection Time: 12/28/21  9:29 AM  Result Value Ref Range   Heparin Unfractionated 0.56 0.30 - 0.70 IU/mL    Comment: (NOTE) The clinical reportable range upper limit is being lowered to >1.10 to align with the FDA approved guidance for the current laboratory assay.  If heparin results are below expected values, and patient dosage has  been confirmed, suggest follow up testing of antithrombin III levels. Performed at [facility], [place] [address], [place], [place] [address]   Basic metabolic panel     Status: None   Collection Time: 01/13/22  8:39 PM  Result Value Ref Range   Sodium 138 135 - 145 mmol/L   Potassium 4.4 3.5 - 5.1 mmol/L   Chloride 106 98 - 111  mmol/L   CO2 24 22 - 32 mmol/L   Glucose, Bld 96 70 - 99 mg/dL    Comment: Glucose reference range applies only to samples taken after fasting for at least 8 hours.   BUN 19 8 - 23 mg/dL   Creatinine, Ser 0.85 0.44 - 1.00 mg/dL  Calcium 9.2 8.9 - 10.3 mg/dL   GFR, Estimated >60 >60 mL/min    Comment: (NOTE) Calculated using the CKD-EPI Creatinine Equation (2021)    Anion gap 8 5 - 15    Comment: Performed at [facility], [place] [address], [place], [place] [address]  CBC with Differential     Status: Abnormal   Collection Time: 01/13/22  8:39 PM  Result Value Ref Range   WBC 4.5 4.0 - 10.5 K/uL   RBC 3.81 (L) 3.87 - 5.11 MIL/uL   Hemoglobin 12.3 12.0 - 15.0 g/dL   HCT 38.6 36.0 - 46.0 %   MCV 101.3 (H) 80.0 - 100.0 fL   MCH 32.3 26.0 - 34.0 pg   MCHC 31.9 30.0 - 36.0 g/dL   RDW 12.4 11.5 - 15.5 %   Platelets 318 150 - 400 K/uL   nRBC 0.0 0.0 - 0.2 %   Neutrophils Relative % 43 %   Neutro Abs 1.9 1.7 - 7.7 K/uL   Lymphocytes Relative 40 %   Lymphs Abs 1.8 0.7 - 4.0 K/uL   Monocytes Relative 13 %   Monocytes Absolute 0.6 0.1 - 1.0 K/uL   Eosinophils Relative 4 %   Eosinophils Absolute 0.2 0.0 - 0.5 K/uL   Basophils Relative 0 %   Basophils Absolute 0.0 0.0 - 0.1 K/uL   Immature Granulocytes 0 %   Abs Immature Granulocytes 0.01 0.00 - 0.07 K/uL    Comment: Performed at [place] [address] [place], [place] [address]  Hepatic function panel     Status: None   Collection Time: 01/17/22  1:48 PM  Result Value Ref Range   Total Protein 6.5 6.5 - 8.1 g/dL   Albumin 3.9 3.5 - 5.0 g/dL   AST 25 15 - 41 U/L   ALT 17 0 - 44 U/L   Alkaline Phosphatase 46 38 - 126 U/L   Total Bilirubin 0.3 0.3 - 1.2 mg/dL   Bilirubin, Direct <0.1 0.0 - 0.2 mg/dL   Indirect Bilirubin NOT CALCULATED 0.3 - 0.9 mg/dL    Comment: Performed at [facility] [address], [place], [place] [address]     Complete History and Physical exam available in the office  notes  Orene Desanctis

## 2022-01-17 NOTE — Anesthesia Procedure Notes (Signed)
Procedure Name: MAC Date/Time: 01/17/2022 3:19 PM  Performed by: Janene Harvey, CRNAPre-anesthesia Checklist: Patient identified, Emergency Drugs available, Suction available and Patient being monitored Patient Re-evaluated:Patient Re-evaluated prior to induction Oxygen Delivery Method: Simple face mask Induction Type: IV induction Placement Confirmation: positive ETCO2 Dental Injury: Teeth and Oropharynx as per pre-operative assessment

## 2022-01-17 NOTE — Discharge Instructions (Signed)
  Orthopaedic Hand Surgery Discharge Instructions  WEIGHT BEARING STATUS: Non weight bearing on operative extremity  DRESSING CARE: Please keep your dressing/splint/cast clean and dry until your follow-up appointment. You may shower by placing a waterproof covering over your dressing/splint/cast. Contact your surgeon if your splint/cast gets wet. It will need to be changed to prevent skin breakdown.  PAIN CONTROL: First line medications for post operative pain control are Tylenol (acetaminophen) and Motrin (ibuprofen) if you are able to take these medications. If you have been prescribed a medication these can be taken as breakthrough pain medications. Please note that some narcotic pain medication has acetaminophen added and you should never consume more than 4,000mg of acetaminophen in 24-hour period. Please note that if you are given Toradol (ketorolac) you should not take similar medications such as ibuprofen or naproxen.  DISCHARGE MEDICATIONS: If you have been prescribed medication it was sent electronically to your pharmacy. No changes have been made to your home medications.  ICE/ELEVATION: Ice and elevate your injured extremity as needed. Avoid direct contact of ice with skin.   BANDAGE FEELS TOO TIGHT: If your bandage feels too tight, first make sure you are elevating your fingers as much as possible. The outer layer of the bandage can be unwrapped and reapplied more loosely. If no improvement, you may carefully cut the inner layer longitudinally until the pressure has resolved and then rewrap the outer layer. If you are not comfortable with these instructions, please call the office and the bandage can be changed for you.   FOLLOW UP: You will be called after surgery with an appointment date and time, however if you have not received a phone call within 3 days, please call during regular office hours at [phone] to schedule a post operative appointment.  Please Seek Medical Attention  if: Call MD for: pain or pressure in chest, jaw, arm, back, neck  Call MD for: temperature greater than 101 F for more than 24 hrs Call MD for: difficulty breathing Call MD for: incision redness, bleeding, drainage  Call MD for: palpitations or feeling that the heart is racing  Call MD for: increased swelling in arm, leg, ankle, or abdomen  Call MD for: lightheadedness, dizziness, fainting Call 911 or go to ER for any medical emergency if you are not able to get in touch with your doctor   J. Reid Cataldo Cosgriff, MD Orthopaedic Hand Surgeon [facility] Office number: [phone] [address] [place], [place] [address]  

## 2022-01-17 NOTE — Transfer of Care (Signed)
Immediate Anesthesia Transfer of Care Note  Patient: Trevia Nop  Procedure(s) Performed: Right index finger partial amputation (Right: Hand)  Patient Location: PACU  Anesthesia Type:MAC  Level of Consciousness: drowsy and patient cooperative  Airway & Oxygen Therapy: Patient Spontanous Breathing and Patient connected to face mask oxygen  Post-op Assessment: Report given to RN and Post -op Vital signs reviewed and stable  Post vital signs: Reviewed and stable  Last Vitals:  Vitals Value Taken Time  BP 125/66 01/17/22 1615  Temp 36.5 C 01/17/22 1615  Pulse 55 01/17/22 1617  Resp 14 01/17/22 1617  SpO2 97 % 01/17/22 1617  Vitals shown include unvalidated device data.  Last Pain:  Vitals:   01/17/22 1216  TempSrc:   PainSc: 5          Complications: No notable events documented.

## 2022-01-17 NOTE — Op Note (Signed)
OPERATIVE NOTE  DATE OF PROCEDURE: 01/17/2022  SURGEONS:  Primary: Orene Desanctis, MD  PREOPERATIVE DIAGNOSIS: Right index finger ischemia  POSTOPERATIVE DIAGNOSIS: Same  NAME OF PROCEDURE:   Right index finger partial amputation, digital neurectomies with primary closure  ANESTHESIA: Monitor Anesthesia Care + Local  SKIN PREPARATION: Hibiclens  ESTIMATED BLOOD LOSS: Minimal  IMPLANTS: none  INDICATIONS:  Tonya Ingram is a 70 y.o. female who has the above preoperative diagnosis. The patient has decided to proceed with surgical intervention.  Risks, benefits and alternatives of operative management were discussed including, but not limited to, risks of anesthesia complications, infection, pain, persistent symptoms, stiffness, need for future surgery.  The patient understands, agrees and elects to proceed with surgery.    DESCRIPTION OF PROCEDURE: The patient was met in the pre-operative area and their identity was verified.  The operative location and laterality was also verified and marked.  The patient was brought to the OR and was placed supine on the table.  After repeat patient identification with the operative team anesthesia was provided and the patient was prepped and draped in the usual sterile fashion.  A final timeout was performed verifying the correction patient, procedure, location and laterality.  The right index finger was elevated and a finger tourniquet was placed to the base of the right index finger.  A fishmouth incision was made over the DIP joint of the right index finger.  Full-thickness flaps were created and the distal interphalangeal joint was disarticulated.  A rondure and bone cutter and a rasp was utilized to smooth the end of the middle phalanx and a partial resection of the middle phalanx was performed in order to have adequate closure over the bone with a tension-free and at a location that will heal with excellent perfusion.  The digital neurovascular bundles  were identified and traction neurectomies were performed of the digital nerves and bipolar cautery was utilized for hemostasis.  The wound was thoroughly irrigated.  The flexor and extensor tendons were trimmed back beneath the surface and allowed to retract.  The wound was again thoroughly irrigated and closed with 4-0 chromic suture with a combination of horizontal mattress and simple suture closure.  The finger tourniquet was removed and was warmed with warm saline and there was excellent perfusion to the tip of the finger stump with brisk capillary refill.  At this time, bacitracin, Adaptic rolled gauze and loosely applied Coban was applied to the tip of the digit.  The patient tolerated the procedure well.  All counts were correct x2.  The patient was awoken from anesthesia and brought to PACU for recovery in stable condition.  Tonya Holmes, MD

## 2022-01-17 NOTE — Anesthesia Postprocedure Evaluation (Signed)
Anesthesia Post Note  Patient: Tonya Ingram  Procedure(s) Performed: Right index finger partial amputation (Right: Hand)     Patient location during evaluation: PACU Anesthesia Type: MAC Level of consciousness: awake Pain management: pain level controlled Vital Signs Assessment: post-procedure vital signs reviewed and stable Respiratory status: spontaneous breathing Cardiovascular status: stable Postop Assessment: no apparent nausea or vomiting Anesthetic complications: no   No notable events documented.  Last Vitals:  Vitals:   01/17/22 1630 01/17/22 1645  BP: 140/72 126/63  Pulse: (!) 52 (!) 58  Resp: 11 12  Temp:  36.6 C  SpO2: 99% 98%    Last Pain:  Vitals:   01/17/22 1615  TempSrc:   PainSc: 0-No pain                 Disney Ruggiero

## 2022-01-18 ENCOUNTER — Encounter (HOSPITAL_COMMUNITY): Payer: Self-pay | Admitting: Orthopedic Surgery

## 2022-01-21 ENCOUNTER — Ambulatory Visit
Admission: RE | Admit: 2022-01-21 | Discharge: 2022-01-21 | Disposition: A | Discharge status: Home / Self Care | Payer: No Typology Code available for payment source | Source: Ambulatory Visit | Attending: Sports Medicine | Admitting: Sports Medicine

## 2022-01-21 ENCOUNTER — Encounter (HOSPITAL_COMMUNITY)
Admission: RE | Admit: 2022-01-21 | Discharge: 2022-01-21 | Disposition: A | Discharge status: Home / Self Care | Payer: No Typology Code available for payment source | Source: Ambulatory Visit | Attending: Physician Assistant | Admitting: Physician Assistant

## 2022-01-21 ENCOUNTER — Other Ambulatory Visit: Payer: Self-pay | Admitting: Sports Medicine

## 2022-01-21 DIAGNOSIS — R911 Solitary pulmonary nodule: Secondary | ICD-10-CM | POA: Diagnosis not present

## 2022-01-21 DIAGNOSIS — M5416 Radiculopathy, lumbar region: Secondary | ICD-10-CM

## 2022-01-21 LAB — SURGICAL PATHOLOGY

## 2022-01-21 LAB — GLUCOSE, CAPILLARY: Glucose-Capillary: 88 mg/dL (ref 70–99)

## 2022-01-21 MED ORDER — METHYLPREDNISOLONE ACETATE 40 MG/ML INJ SUSP (RADIOLOG: 80.0000 mg | Freq: Once | INTRAMUSCULAR | Status: DC

## 2022-01-21 MED ORDER — FLUDEOXYGLUCOSE F - 18 (FDG) INJECTION
6.0000 | Freq: Once | INTRAVENOUS | Status: AC | PRN
  Administered 2022-01-21: 6 via INTRAVENOUS

## 2022-01-21 MED ORDER — IOPAMIDOL (ISOVUE-M 200) INJECTION 41%: 1.0000 mL | Freq: Once | INTRAMUSCULAR | Status: DC

## 2022-01-21 NOTE — Discharge Instructions (Signed)

## 2022-01-24 NOTE — Progress Notes (Signed)
Cardiology Office Note:    Date:  01/25/2022   ID:  Tonya Ingram, DOB 03-Sep-1950, MRN [id number]  PCP:  Caren Macadam, MD   [facility] Providers Cardiologist:  None     Referring MD: Caren Macadam, MD   CC: Heart murmur Consulted for the evaluation of a heart murmur at the behest of Dr. Mannie Stabile  History of Present Illness:    Tonya Ingram is a 71 y.o. female with a hx of Raynaud's Disease, hypothyroidism who presents for evaluation.  Patient notes that she is feeling leg pain and and back pain but otherwise feels well.. She cut her finger.  She was having issues with wound healing.  Then saw urgent care.  No improvement, went to ED.  Found to have vascular issues.  CT Aorta OK.  Had a partial ambulation.  Getting back surgery on 8/282/23.  Has had no chest pain, chest pressure, chest tightness, chest stinging . Does a lot biking still despite having leg pain and back pain.  No shortness of breath, DOE .  No PND or orthopnea.  No weight gain, leg swelling , or abdominal swelling.  No syncope or near syncope . Notes  no palpitations or funny heart beats.     Patient reports prior cardiac testing including CT Aorta Dissection protocol  No history of pre-eclampsia, gestation HTN or gestational DM.     Past Medical History:  Diagnosis Date   Arthritis    Depression    GERD (gastroesophageal reflux disease)    Heart murmur    never has caused problems   HSV infection    valtrex   Hypothyroidism    Raynaud's disease     Past Surgical History:  Procedure Laterality Date   AMPUTATION Right 01/17/2022   Procedure: Right index finger partial amputation;  Surgeon: Orene Desanctis, MD;  Location: [facility];  Service: Orthopedics;  Laterality: Right;  with local anesthesia   THYROIDECTOMY     TOTAL HIP ARTHROPLASTY Left 06/30/2020   Procedure: LEFT TOTAL HIP ARTHROPLASTY ANTERIOR APPROACH;  Surgeon: Mcarthur Rossetti, MD;  Location: [facility];  Service: Orthopedics;   Laterality: Left;   TUBAL LIGATION     WISDOM TOOTH EXTRACTION      Current Medications: Current Meds  Medication Sig   Calcium Carb-Cholecalciferol (CALCIUM + VITAMIN D3 PO) Take 1 tablet by mouth daily.   cetirizine (ZYRTEC) 10 MG tablet Take 10 mg by mouth daily.   escitalopram (LEXAPRO) 10 MG tablet daily.   HYDROcodone-acetaminophen (NORCO/VICODIN) 5-325 MG tablet as needed for pain.   levothyroxine (SYNTHROID) 50 MCG tablet Take 50 mcg by mouth daily before breakfast.   MAGNESIUM PO Take 400 mg by mouth daily.   meclizine (ANTIVERT) 25 MG tablet Take 25 mg by mouth 3 (three) times daily as needed for dizziness (vertigo).   traMADol (ULTRAM) 50 MG tablet Take 1 tablet (50 mg total) by mouth every 6 (six) hours as needed.   valACYclovir (VALTREX) 500 MG tablet Take 500 mg by mouth daily.   [DISCONTINUED] aspirin EC (ASPIRIN 81) 81 MG tablet Take 1 tablet (81 mg total) by mouth daily. Swallow whole.   [DISCONTINUED] NONFORMULARY OR COMPOUNDED ITEM Apply 1 application topically daily. Hormone Cream : estrogen and progesterone mixed     Allergies:   Codeine   Social History   Socioeconomic History   Marital status: Divorced    Spouse name: Not on file   Number of children: Not on file   Years of education:  Not on file   Highest education level: Not on file  Occupational History   Not on file  Tobacco Use   Smoking status: Never   Smokeless tobacco: Never  Vaping Use   Vaping Use: Never used  Substance and Sexual Activity   Alcohol use: Never   Drug use: Never   Sexual activity: Yes    Birth control/protection: Surgical    Comment: tubal ligation  Other Topics Concern   Not on file  Social History Narrative   Not on file   Social Determinants of Health   Financial Resource Strain: Not on file  Food Insecurity: Not on file  Transportation Needs: Not on file  Physical Activity: Not on file  Stress: Not on file  Social Connections: Not on file    Social: Retired  Therapist, sports and NP  Family History: The patient's family history includes Arthritis in her mother; Hypertension in her father; Parkinson's disease in her mother; Prostate cancer in her father.  ROS:   Please see the history of present illness.     All other systems reviewed and are negative.  EKGs/Labs/Other Studies Reviewed:    The following studies were reviewed today:  EKG:   12/28/21: Sinus bradycardia with lateral TWI  Recent Labs: 01/13/2022: BUN 19; Creatinine, Ser 0.85; Hemoglobin 12.3; Platelets 318; Potassium 4.4; Sodium 138 01/17/2022: ALT 17  Recent Lipid Panel No results found for: "CHOL", "TRIG", "HDL", "CHOLHDL", "VLDL", "LDLCALC", "LDLDIRECT"      Physical Exam:    VS:  BP 122/62   Pulse 67   Ht '5\' 5"'$  (1.651 m)   Wt 126 lb 6.4 oz (57.3 kg)   SpO2 (!) 0%   BMI 21.03 kg/m     Wt Readings from Last 3 Encounters:  01/25/22 126 lb 6.4 oz (57.3 kg)  01/17/22 120 lb (54.4 kg)  01/09/22 126 lb (57.2 kg)    GEN:  Well nourished, well developed in no acute distress HEENT: Normal NECK: No JVD; No carotid bruits LYMPHATICS: No lymphadenopathy CARDIAC: RRR, prominent systolic crescendo, no rubs, gallops RESPIRATORY:  Clear to auscultation without rales, wheezing or rhonchi  ABDOMEN: Soft, non-tender, non-distended MUSCULOSKELETAL:  No edema; No deformity  SKIN: Warm and dry NEUROLOGIC:  Alert and oriented x 3 PSYCHIATRIC:  Normal affect   ASSESSMENT:    1. Heart murmur, systolic   2. Coronary artery calcification   3. Aortic atherosclerosis ([place])   4. Mixed hyperlipidemia   5. Preop cardiovascular exam    PLAN:    New Heart murmur AV Calcium 12/28/21 - will get expedite echo (before 02/18/22 surgery with Dr. Ellene Route, as no severe disease should be reasonable for surgery  Preoperative Risk Assessment - The Revised Cardiac Risk Index 0=0.4%: very low risk of perioperative myocardial infarction, pulmonary edema, ventricular fibrillation, cardiac arrest, or complete  heart block.  - DASI score of 33 associated with 6.8 functional mets - No further cardiac testing is recommended prior to surgery (will get echo as above)  HLD Aortic atherosclerosis CAC - at next visit, needs to start stain therapy LDL < 70 to start; given her present myalgias will wait until post surgery to start therapy  APP in 6 months unless significant valve disease, then with me      Medication Adjustments/Labs and Tests Ordered: Current medicines are reviewed at length with the patient today.  Concerns regarding medicines are outlined above.  Orders Placed This Encounter  Procedures   ECHOCARDIOGRAM COMPLETE   No orders of the  defined types were placed in this encounter.   Patient Instructions  Medication Instructions:  Your physician recommends that you continue on your current medications as directed. Please refer to the Current Medication list given to you today.  *If you need a refill on your cardiac medications before your next appointment, please call your pharmacy*   Lab Work: NONE If you have labs (blood work) drawn today and your tests are completely normal, you will receive your results only by: [place] (if you have MyChart) OR A paper copy in the mail If you have any lab test that is abnormal or we need to change your treatment, we will call you to review the results.   Testing/Procedures: ASAP- Your physician has requested that you have an echocardiogram. Echocardiography is a painless test that uses sound waves to create images of your heart. It provides your doctor with information about the size and shape of your heart and how well your heart's chambers and valves are working. This procedure takes approximately one hour. There are no restrictions for this procedure.    Follow-Up: At [facility], you and your health needs are our priority.  As part of our continuing mission to provide you with exceptional heart care, we have created  designated Provider Care Teams.  These Care Teams include your primary Cardiologist (physician) and Advanced Practice Providers (APPs -  Physician Assistants and Nurse Practitioners) who all work together to provide you with the care you need, when you need it.  Your next appointment:   6 month(s)  The format for your next appointment:   In Person  Provider:   Melina Copa, PA-C, Ermalinda Barrios, PA-C, or Christen Bame, NP.{  Important Information About Sugar         Signed, Werner Lean, MD  01/25/2022 10:32 AM    [place]

## 2022-01-25 ENCOUNTER — Ambulatory Visit (INDEPENDENT_AMBULATORY_CARE_PROVIDER_SITE_OTHER): Payer: No Typology Code available for payment source | Admitting: Internal Medicine

## 2022-01-25 ENCOUNTER — Encounter: Payer: Self-pay | Admitting: Internal Medicine

## 2022-01-25 ENCOUNTER — Other Ambulatory Visit: Payer: Self-pay | Admitting: Sports Medicine

## 2022-01-25 ENCOUNTER — Ambulatory Visit
Admission: RE | Admit: 2022-01-25 | Discharge: 2022-01-25 | Disposition: A | Discharge status: Home / Self Care | Payer: No Typology Code available for payment source | Source: Ambulatory Visit | Attending: Sports Medicine | Admitting: Sports Medicine

## 2022-01-25 VITALS — BP 122/62 | HR 67 | Ht 65.0 in | Wt 126.4 lb

## 2022-01-25 DIAGNOSIS — I2584 Coronary atherosclerosis due to calcified coronary lesion: Secondary | ICD-10-CM

## 2022-01-25 DIAGNOSIS — M5416 Radiculopathy, lumbar region: Secondary | ICD-10-CM

## 2022-01-25 DIAGNOSIS — Z01810 Encounter for preprocedural cardiovascular examination: Secondary | ICD-10-CM | POA: Diagnosis not present

## 2022-01-25 DIAGNOSIS — M48061 Spinal stenosis, lumbar region without neurogenic claudication: Secondary | ICD-10-CM | POA: Diagnosis not present

## 2022-01-25 DIAGNOSIS — R011 Cardiac murmur, unspecified: Secondary | ICD-10-CM | POA: Diagnosis not present

## 2022-01-25 DIAGNOSIS — I700 Atherosclerosis of aorta: Secondary | ICD-10-CM | POA: Diagnosis not present

## 2022-01-25 DIAGNOSIS — E782 Mixed hyperlipidemia: Secondary | ICD-10-CM | POA: Diagnosis not present

## 2022-01-25 DIAGNOSIS — I2510 Atherosclerotic heart disease of native coronary artery without angina pectoris: Secondary | ICD-10-CM

## 2022-01-25 DIAGNOSIS — M4727 Other spondylosis with radiculopathy, lumbosacral region: Secondary | ICD-10-CM | POA: Diagnosis not present

## 2022-01-25 MED ORDER — METHYLPREDNISOLONE ACETATE 40 MG/ML INJ SUSP (RADIOLOG
80.0000 mg | Freq: Once | INTRAMUSCULAR | Status: AC
  Administered 2022-01-25: 80 mg via EPIDURAL

## 2022-01-25 MED ORDER — IOPAMIDOL (ISOVUE-M 200) INJECTION 41%
1.0000 mL | Freq: Once | INTRAMUSCULAR | Status: AC
  Administered 2022-01-25: 1 mL via EPIDURAL

## 2022-01-25 NOTE — Discharge Instructions (Signed)

## 2022-01-25 NOTE — Patient Instructions (Signed)
Medication Instructions:  Your physician recommends that you continue on your current medications as directed. Please refer to the Current Medication list given to you today.  *If you need a refill on your cardiac medications before your next appointment, please call your pharmacy*   Lab Work: NONE If you have labs (blood work) drawn today and your tests are completely normal, you will receive your results only by: Tonya Ingram (if you have MyChart) OR A paper copy in the mail If you have any lab test that is abnormal or we need to change your treatment, we will call you to review the results.   Testing/Procedures: ASAP- Your physician has requested that you have an echocardiogram. Echocardiography is a painless test that uses sound waves to create images of your heart. It provides your doctor with information about the size and shape of your heart and how well your heart's chambers and valves are working. This procedure takes approximately one hour. There are no restrictions for this procedure.    Follow-Up: At [facility], you and your health needs are our priority.  As part of our continuing mission to provide you with exceptional heart care, we have created designated Provider Care Teams.  These Care Teams include your primary Cardiologist (physician) and Advanced Practice Providers (APPs -  Physician Assistants and Nurse Practitioners) who all work together to provide you with the care you need, when you need it.  Your next appointment:   6 month(s)  The format for your next appointment:   In Person  Provider:   Melina Copa, PA-C, Ermalinda Barrios, PA-C, or Christen Bame, NP.{  Important Information About Sugar

## 2022-01-29 DIAGNOSIS — M79644 Pain in right finger(s): Secondary | ICD-10-CM | POA: Diagnosis not present

## 2022-01-29 DIAGNOSIS — I998 Other disorder of circulatory system: Secondary | ICD-10-CM | POA: Diagnosis not present

## 2022-01-29 DIAGNOSIS — R030 Elevated blood-pressure reading, without diagnosis of hypertension: Secondary | ICD-10-CM | POA: Diagnosis not present

## 2022-01-29 DIAGNOSIS — F339 Major depressive disorder, recurrent, unspecified: Secondary | ICD-10-CM | POA: Diagnosis not present

## 2022-01-30 ENCOUNTER — Ambulatory Visit (HOSPITAL_COMMUNITY): Payer: No Typology Code available for payment source | Attending: Internal Medicine

## 2022-01-30 DIAGNOSIS — R011 Cardiac murmur, unspecified: Secondary | ICD-10-CM | POA: Diagnosis not present

## 2022-01-30 LAB — ECHOCARDIOGRAM COMPLETE
AR max vel: 1.26 cm2
AV Area VTI: 1.13 cm2
AV Area mean vel: 1.17 cm2
AV Mean grad: 17.2 mmHg
AV Peak grad: 30.2 mmHg
Ao pk vel: 2.75 m/s
Area-P 1/2: 1.69 cm2
S' Lateral: 2.8 cm

## 2022-02-06 DIAGNOSIS — M79644 Pain in right finger(s): Secondary | ICD-10-CM | POA: Diagnosis not present

## 2022-02-11 NOTE — Pre-Procedure Instructions (Signed)
Surgical Instructions    Your procedure is scheduled on Monday, August 28.  Report to [facility] Main Entrance "A" at 5:30 A.M., then check in with the Admitting office.  Call this number if you have problems the morning of surgery:  [phone]   If you have any questions prior to your surgery date call [phone]: Open Monday-Friday 8am-4pm    Remember:  Do not eat after midnight the night before your surgery  You may drink clear liquids until 4:30AM the morning of your surgery.   Clear liquids allowed are: Water, Non-Citrus Juices (without pulp), Carbonated Beverages, Clear Tea, Black Coffee ONLY (NO MILK, CREAM OR POWDERED CREAMER of any kind), and Gatorade    Take these medicines the morning of surgery with A SIP OF WATER:  levothyroxine (SYNTHROID)  escitalopram (LEXAPRO)  cetirizine (ZYRTEC)  valACYclovir (VALTREX) acetaminophen (TYLENOL)  meclizine (ANTIVERT) if needed  Follow your surgeon's instructions on when to stop Aspirin.  If no instructions were given by your surgeon then you will need to call the office to get those instructions.     As of today, STOP taking any Aleve, Naproxen, Ibuprofen, Motrin, Advil, Goody's, BC's, all herbal medications, fish oil, and all vitamins.   [facility] is not responsible for any belongings or valuables.    Do NOT Smoke (Tobacco/Vaping)  24 hours prior to your procedure  If you use a CPAP at night, you may bring your mask for your overnight stay.   Contacts, glasses, hearing aids, dentures or partials may not be worn into surgery, please bring cases for these belongings   For patients admitted to the hospital, discharge time will be determined by your treatment team.   Patients discharged the day of surgery will not be allowed to drive home, and someone needs to stay with them for 24 hours.   SURGICAL WAITING ROOM VISITATION Patients having surgery or a procedure may have no more than 2 support people in the waiting area  - these visitors may rotate.   Children under the age of 35 must have an adult with them who is not the patient. If the patient needs to stay at the hospital during part of their recovery, the visitor guidelines for inpatient rooms apply. Pre-op nurse will coordinate an appropriate time for 1 support person to accompany patient in pre-op.  This support person may not rotate.   Please refer to the [facility] website for the visitor guidelines for Inpatients (after your surgery is over and you are in a regular room).    Special instructions:    Oral Hygiene is also important to reduce your risk of infection.  Remember - BRUSH YOUR TEETH THE MORNING OF SURGERY WITH YOUR REGULAR TOOTHPASTE   Tonya Ingram- Preparing For Surgery  Before surgery, you can play an important role. Because skin is not sterile, your skin needs to be as free of germs as possible. You can reduce the number of germs on your skin by washing with CHG (chlorahexidine gluconate) Soap before surgery.  CHG is an antiseptic cleaner which kills germs and bonds with the skin to continue killing germs even after washing.     Please do not use if you have an allergy to CHG or antibacterial soaps. If your skin becomes reddened/irritated stop using the CHG.  Do not shave (including legs and underarms) for at least 48 hours prior to first CHG shower. It is OK to shave your face.  Please follow these instructions carefully.  Shower the NIGHT BEFORE SURGERY and the MORNING OF SURGERY with CHG Soap.   If you chose to wash your hair, wash your hair first as usual with your normal shampoo. After you shampoo, rinse your hair and body thoroughly to remove the shampoo.  Then [employer] and genitals (private parts) with your normal soap and rinse thoroughly to remove soap.  After that Use CHG Soap as you would any other liquid soap. You can apply CHG directly to the skin and wash gently with a scrungie or a clean washcloth.   Apply the CHG  Soap to your body ONLY FROM THE NECK DOWN.  Do not use on open wounds or open sores. Avoid contact with your eyes, ears, mouth and genitals (private parts). Wash Face and genitals (private parts)  with your normal soap.   Wash thoroughly, paying special attention to the area where your surgery will be performed.  Thoroughly rinse your body with warm water from the neck down.  DO NOT shower/wash with your normal soap after using and rinsing off the CHG Soap.  Pat yourself dry with a CLEAN TOWEL.  Wear CLEAN PAJAMAS to bed the night before surgery  Place CLEAN SHEETS on your bed the night before your surgery  DO NOT SLEEP WITH PETS.   Day of Surgery:  Take a shower with CHG soap. Wear Clean/Comfortable clothing the morning of surgery Do not wear jewelry or makeup. Do not wear lotions, powders, perfumes/cologne or deodorant. Do not shave 48 hours prior to surgery.  Men may shave face and neck. Do not bring valuables to the hospital. Do not wear nail polish, gel polish, artificial nails, or any other type of covering on natural nails (fingers and toes) If you have artificial nails or gel coating that need to be removed by a nail salon, please have this removed prior to surgery. Artificial nails or gel coating may interfere with anesthesia's ability to adequately monitor your vital signs. Remember to brush your teeth WITH YOUR REGULAR TOOTHPASTE.    If you received a COVID test during your pre-op visit, it is requested that you wear a mask when out in public, stay away from anyone that may not be feeling well, and notify your surgeon if you develop symptoms. If you have been in contact with anyone that has tested positive in the last 10 days, please notify your surgeon.    Please read over the following fact sheets that you were given.

## 2022-02-12 ENCOUNTER — Encounter (HOSPITAL_COMMUNITY): Payer: Self-pay

## 2022-02-12 ENCOUNTER — Other Ambulatory Visit: Payer: Self-pay

## 2022-02-12 ENCOUNTER — Encounter (HOSPITAL_COMMUNITY)
Admission: RE | Admit: 2022-02-12 | Discharge: 2022-02-12 | Disposition: A | Discharge status: Home / Self Care | Payer: No Typology Code available for payment source | Source: Ambulatory Visit | Attending: Neurological Surgery | Admitting: Neurological Surgery

## 2022-02-12 VITALS — BP 133/67 | HR 67 | Temp 98.0°F | Resp 16 | Ht 65.0 in | Wt 123.0 lb

## 2022-02-12 DIAGNOSIS — Z01818 Encounter for other preprocedural examination: Secondary | ICD-10-CM | POA: Insufficient documentation

## 2022-02-12 DIAGNOSIS — I2510 Atherosclerotic heart disease of native coronary artery without angina pectoris: Secondary | ICD-10-CM | POA: Diagnosis not present

## 2022-02-12 LAB — CBC
HCT: 38.7 % (ref 36.0–46.0)
Hemoglobin: 12.5 g/dL (ref 12.0–15.0)
MCH: 32.3 pg (ref 26.0–34.0)
MCHC: 32.3 g/dL (ref 30.0–36.0)
MCV: 100 fL (ref 80.0–100.0)
Platelets: 346 10*3/uL (ref 150–400)
RBC: 3.87 MIL/uL (ref 3.87–5.11)
RDW: 12.3 % (ref 11.5–15.5)
WBC: 6 10*3/uL (ref 4.0–10.5)
nRBC: 0 % (ref 0.0–0.2)

## 2022-02-12 LAB — TYPE AND SCREEN
ABO/RH(D): O POS
Antibody Screen: NEGATIVE

## 2022-02-12 LAB — BASIC METABOLIC PANEL
Anion gap: 7 (ref 5–15)
BUN: 13 mg/dL (ref 8–23)
CO2: 27 mmol/L (ref 22–32)
Calcium: 9.4 mg/dL (ref 8.9–10.3)
Chloride: 104 mmol/L (ref 98–111)
Creatinine, Ser: 0.78 mg/dL (ref 0.44–1.00)
GFR, Estimated: >60 mL/min (ref >60)
Glucose, Bld: 78 mg/dL (ref 70–99)
Potassium: 4 mmol/L (ref 3.5–5.1)
Sodium: 138 mmol/L (ref 135–145)

## 2022-02-12 LAB — SURGICAL PCR SCREEN
MRSA, PCR: NEGATIVE
Staphylococcus aureus: NEGATIVE

## 2022-02-12 NOTE — Progress Notes (Signed)
PCP - Dr. Caren Macadam Cardiologist - Dr. Gasper Sells  PPM/ICD - Denies Device Orders - n/a Rep Notified - n/a  Chest x-ray - n/a EKG - 12/28/2021 Stress Test - n/a ECHO - 01/30/2022 Cardiac Cath - n/a  Sleep Study - Denies CPAP - n/a  No DM  Blood Thinner Instructions: n/a Aspirin Instructions: Pt has already stopped her ASA.   ERAS Protcol - Yes. Clear liquids until 0430 morning of surgery PRE-SURGERY Ensure or G2- n/a. None ordered  COVID TEST- n/a   Anesthesia review: Yes. Recent heart murmur found. Became established with Cardiologist to follow and echocardiogram done per MD.  Patient denies shortness of breath, fever, cough and chest pain at PAT appointment   All instructions explained to the patient, with a verbal understanding of the material. Patient agrees to go over the instructions while at home for a better understanding. Patient also instructed to self quarantine after being tested for COVID-19. The opportunity to ask questions was provided.

## 2022-02-13 DIAGNOSIS — M79644 Pain in right finger(s): Secondary | ICD-10-CM | POA: Diagnosis not present

## 2022-02-13 NOTE — Anesthesia Preprocedure Evaluation (Addendum)
Anesthesia Evaluation  Patient identified by MRN, date of birth, ID band Patient awake    Reviewed: Allergy & Precautions, NPO status , Patient's Chart, lab work & pertinent test results  Airway Mallampati: I       Dental no notable dental hx.    Pulmonary neg pulmonary ROS,    Pulmonary exam normal        Cardiovascular Normal cardiovascular exam     Neuro/Psych PSYCHIATRIC DISORDERS Depression negative neurological ROS     GI/Hepatic   Endo/Other  Hypothyroidism   Renal/GU      Musculoskeletal   Abdominal Normal abdominal exam  (+)   Peds  Hematology   Anesthesia Other Findings  FINDINGS  Left Ventricle: Left ventricular ejection fraction, by estimation, is 60  to 65%. The left ventricle has normal function. The left ventricle has no  regional wall motion abnormalities. The left ventricular internal cavity  size was normal in size. There is  mild concentric left ventricular hypertrophy. Left ventricular diastolic  parameters are consistent with Grade I diastolic dysfunction (impaired  relaxation).   Right Ventricle: The right ventricular size is normal. No increase in  right ventricular wall thickness. Right ventricular systolic function is  normal.   Left Atrium: Left atrial size was mild to moderately dilated.   Right Atrium: Right atrial size was moderately dilated.   Pericardium: There is no evidence of pericardial effusion.   Mitral Valve: The mitral valve is normal in structure. Mild mitral valve  regurgitation. No evidence of mitral valve stenosis.   Tricuspid Valve: The tricuspid valve is normal in structure. Tricuspid  valve regurgitation is mild . No evidence of tricuspid stenosis.   Aortic Valve: The aortic valve is tricuspid. There is moderate  calcification of the aortic valve. Aortic valve regurgitation is not  visualized. Mild to moderate aortic stenosis is present. Aortic valve mean   gradient measures 17.2 mmHg. Aortic valve peak  gradient measures 30.2 mmHg. Aortic valve area, by VTI measures 1.13 cm.   Pulmonic Valve: The pulmonic valve was normal in structure. Pulmonic valve  regurgitation is not visualized. No evidence of pulmonic stenosis.   Aorta: The aortic root is normal in size and structure.   Venous: The inferior vena cava is normal in size with greater than 50%  respiratory variability, suggesting right atrial pressure of 3 mmHg.     Reproductive/Obstetrics                            Anesthesia Physical Anesthesia Plan  ASA: 2  Anesthesia Plan: General   Post-op Pain Management: Dilaudid IV   Induction: Intravenous  PONV Risk Score and Plan: 4 or greater and Ondansetron, Dexamethasone and Treatment may vary due to age or medical condition  Airway Management Planned: Oral ETT  Additional Equipment: None  Intra-op Plan:   Post-operative Plan: Extubation in OR  Informed Consent: I have reviewed the patients History and Physical, chart, labs and discussed the procedure including the risks, benefits and alternatives for the proposed anesthesia with the patient or authorized representative who has indicated his/her understanding and acceptance.     Dental advisory given  Plan Discussed with: CRNA  Anesthesia Plan Comments: (PAT note by Karoline Caldwell, PA-C: Patient recently referred to cardiology for evaluation of murmur as well as preop eval.  Seen by Dr. Gasper Sells 01/25/2022.  Per note, "New Heart murmur: AV Calcium 12/28/21 - will get expedite echo (before 02/18/22 surgery with  Dr. Ellene Route, as no severe disease should be reasonable for surgery. Preoperative Risk Assessment: - The Revised Cardiac Risk Index 0=0.4%: very low risk of perioperative myocardial infarction, pulmonary edema, ventricular fibrillation, cardiac arrest, or complete heart block.  - DASI score of33associated with 6.22fnctional mets - No further  cardiac testing is recommended prior to surgery(will get echo as above)."  Echo 01/30/2022 showed mild to moderate AS with mean gradient 17.2 mmHg.  Dr. CGasper Sellscommented on result stating, "Results: Mild to moderate AS. Plan: Echo in one year. OK for surgery."  History of Raynaud's.  Recently underwent partial amputation of right index finger on 01/17/2022.  Preop labs reviewed, WNL.  EKG 12/28/21: Sinus bradycardia. Rate 52. Left axis deviation. Nonspecific T wave abnormality  TTE 01/30/2022: 1. Left ventricular ejection fraction, by estimation, is 60 to 65%. The  left ventricle has normal function. The left ventricle has no regional  wall motion abnormalities. There is mild concentric left ventricular  hypertrophy. Left ventricular diastolic  parameters are consistent with Grade I diastolic dysfunction (impaired  relaxation).  2. Right ventricular systolic function is normal. The right ventricular  size is normal.  3. Left atrial size was mild to moderately dilated.  4. Right atrial size was moderately dilated.  5. The mitral valve is normal in structure. Mild mitral valve  regurgitation. No evidence of mitral stenosis.  6. The aortic valve is tricuspid. There is moderate calcification of the  aortic valve. Aortic valve regurgitation is not visualized. Mild to  moderate aortic valve stenosis. Aortic valve area, by VTI measures 1.13  cm. Aortic valve mean gradient measures  17.2 mmHg. Aortic valve Vmax measures 2.75 m/s.  7. The inferior vena cava is normal in size with greater than 50%  respiratory variability, suggesting right atrial pressure of 3 mmHg.   )      Anesthesia Quick Evaluation

## 2022-02-13 NOTE — Progress Notes (Signed)
Anesthesia Chart Review:  Patient recently referred to cardiology for evaluation of murmur as well as preop eval.  Seen by Dr. Gasper Sells 01/25/2022.  Per note, "New Heart murmur: AV Calcium 12/28/21 - will get expedite echo (before 02/18/22 surgery with Dr. Ellene Route, as no severe disease should be reasonable for surgery. Preoperative Risk Assessment: - The Revised Cardiac Risk Index 0=0.4%: very low risk of perioperative myocardial infarction, pulmonary edema, ventricular fibrillation, cardiac arrest, or complete heart block.  - DASI score of 33 associated with 6.8 functional mets - No further cardiac testing is recommended prior to surgery (will get echo as above)."  Echo 01/30/2022 showed mild to moderate AS with mean gradient 17.2 mmHg.  Dr. Gasper Sells commented on result stating, "Results: Mild to moderate AS. Plan: Echo in one year. OK for surgery."  History of Raynaud's.  Recently underwent partial amputation of right index finger on 01/17/2022.  Preop labs reviewed, WNL.  EKG 12/28/21: Sinus bradycardia. Rate 52. Left axis deviation. Nonspecific T wave abnormality  TTE 01/30/2022:  1. Left ventricular ejection fraction, by estimation, is 60 to 65%. The  left ventricle has normal function. The left ventricle has no regional  wall motion abnormalities. There is mild concentric left ventricular  hypertrophy. Left ventricular diastolic  parameters are consistent with Grade I diastolic dysfunction (impaired  relaxation).   2. Right ventricular systolic function is normal. The right ventricular  size is normal.   3. Left atrial size was mild to moderately dilated.   4. Right atrial size was moderately dilated.   5. The mitral valve is normal in structure. Mild mitral valve  regurgitation. No evidence of mitral stenosis.   6. The aortic valve is tricuspid. There is moderate calcification of the  aortic valve. Aortic valve regurgitation is not visualized. Mild to  moderate aortic valve stenosis.  Aortic valve area, by VTI measures 1.13  cm. Aortic valve mean gradient measures   17.2 mmHg. Aortic valve Vmax measures 2.75 m/s.   7. The inferior vena cava is normal in size with greater than 50%  respiratory variability, suggesting right atrial pressure of 3 mmHg.    Wynonia Musty [facility] Short Stay Center/Anesthesiology Phone [phone] 02/13/2022 1:46 PM

## 2022-02-18 ENCOUNTER — Ambulatory Visit (HOSPITAL_COMMUNITY): Payer: No Typology Code available for payment source

## 2022-02-18 ENCOUNTER — Observation Stay (HOSPITAL_COMMUNITY)
Admission: RE | Admit: 2022-02-18 | Discharge: 2022-02-20 | Disposition: A | Discharge status: Home / Self Care | Payer: No Typology Code available for payment source | Attending: Neurological Surgery | Admitting: Neurological Surgery

## 2022-02-18 ENCOUNTER — Ambulatory Visit (HOSPITAL_COMMUNITY): Payer: No Typology Code available for payment source | Admitting: Physician Assistant

## 2022-02-18 ENCOUNTER — Ambulatory Visit (HOSPITAL_BASED_OUTPATIENT_CLINIC_OR_DEPARTMENT_OTHER): Payer: No Typology Code available for payment source

## 2022-02-18 ENCOUNTER — Encounter (HOSPITAL_COMMUNITY)
Admission: RE | Disposition: A | Discharge status: Home / Self Care | Payer: Self-pay | Source: Home / Self Care | Attending: Neurological Surgery

## 2022-02-18 ENCOUNTER — Encounter (HOSPITAL_COMMUNITY): Payer: Self-pay | Admitting: Neurological Surgery

## 2022-02-18 ENCOUNTER — Other Ambulatory Visit: Payer: Self-pay

## 2022-02-18 DIAGNOSIS — M4316 Spondylolisthesis, lumbar region: Principal | ICD-10-CM | POA: Insufficient documentation

## 2022-02-18 DIAGNOSIS — M48062 Spinal stenosis, lumbar region with neurogenic claudication: Secondary | ICD-10-CM

## 2022-02-18 DIAGNOSIS — M5416 Radiculopathy, lumbar region: Secondary | ICD-10-CM

## 2022-02-18 DIAGNOSIS — Z981 Arthrodesis status: Secondary | ICD-10-CM | POA: Diagnosis not present

## 2022-02-18 LAB — ABO/RH: ABO/RH(D): O POS

## 2022-02-18 SURGERY — POSTERIOR LUMBAR FUSION 2 LEVEL
Anesthesia: General | Site: Back

## 2022-02-18 MED ORDER — LIDOCAINE 2% (20 MG/ML) 5 ML SYRINGE
INTRAMUSCULAR | Status: DC | PRN
  Administered 2022-02-18: 100 mg via INTRAVENOUS

## 2022-02-18 MED ORDER — PROPOFOL 500 MG/50ML IV EMUL
INTRAVENOUS | Status: DC | PRN
  Administered 2022-02-18: 125 ug/kg/min via INTRAVENOUS
  Administered 2022-02-18: 100 ug/kg/min via INTRAVENOUS

## 2022-02-18 MED ORDER — SODIUM CHLORIDE 0.9% FLUSH
3.0000 mL | INTRAVENOUS | Status: DC | PRN
Start: 2022-02-18 — End: 2022-02-20

## 2022-02-18 MED ORDER — GLYCOPYRROLATE PF 0.2 MG/ML IJ SOSY
PREFILLED_SYRINGE | INTRAMUSCULAR | Status: DC | PRN
  Administered 2022-02-18: .2 mg via INTRAVENOUS

## 2022-02-18 MED ORDER — PROPOFOL 10 MG/ML IV BOLUS
INTRAVENOUS | Status: AC
  Filled 2022-02-18: qty 20

## 2022-02-18 MED ORDER — TRAMADOL HCL 50 MG PO TABS: 50.0000 mg | ORAL_TABLET | Freq: Four times a day (QID) | ORAL | Status: DC | PRN

## 2022-02-18 MED ORDER — THROMBIN 5000 UNITS EX SOLR
OROMUCOSAL | Status: DC | PRN
  Administered 2022-02-18: 5 mL via TOPICAL

## 2022-02-18 MED ORDER — MAGNESIUM OXIDE -MG SUPPLEMENT 400 (240 MG) MG PO TABS
400.0000 mg | ORAL_TABLET | Freq: Every day | ORAL | Status: DC
  Filled 2022-02-18: qty 1

## 2022-02-18 MED ORDER — CHLORHEXIDINE GLUCONATE 0.12 % MT SOLN: 15.0000 mL | Freq: Once | OROMUCOSAL | Status: AC

## 2022-02-18 MED ORDER — ACETAMINOPHEN 325 MG PO TABS: 650.0000 mg | ORAL_TABLET | ORAL | Status: DC | PRN

## 2022-02-18 MED ORDER — EPHEDRINE SULFATE-NACL 50-0.9 MG/10ML-% IV SOSY
PREFILLED_SYRINGE | INTRAVENOUS | Status: DC | PRN
  Administered 2022-02-18: 15 mg via INTRAVENOUS

## 2022-02-18 MED ORDER — ROCURONIUM BROMIDE 10 MG/ML (PF) SYRINGE
PREFILLED_SYRINGE | INTRAVENOUS | Status: DC | PRN
  Administered 2022-02-18 (×2): 30 mg via INTRAVENOUS
  Administered 2022-02-18: 60 mg via INTRAVENOUS
  Administered 2022-02-18 (×2): 10 mg via INTRAVENOUS
  Administered 2022-02-18: 40 mg via INTRAVENOUS
  Administered 2022-02-18: 10 mg via INTRAVENOUS

## 2022-02-18 MED ORDER — ESCITALOPRAM OXALATE 10 MG PO TABS: 10.0000 mg | ORAL_TABLET | Freq: Every day | ORAL | Status: DC

## 2022-02-18 MED ORDER — MEPERIDINE HCL 25 MG/ML IJ SOLN: 6.2500 mg | INTRAMUSCULAR | Status: DC | PRN

## 2022-02-18 MED ORDER — CHLORHEXIDINE GLUCONATE CLOTH 2 % EX PADS: 6.0000 | MEDICATED_PAD | Freq: Once | CUTANEOUS | Status: DC

## 2022-02-18 MED ORDER — BUPIVACAINE HCL (PF) 0.5 % IJ SOLN
INTRAMUSCULAR | Status: DC | PRN
  Administered 2022-02-18: 5 mL
  Administered 2022-02-18: 20 mL

## 2022-02-18 MED ORDER — PHENYLEPHRINE HCL-NACL 20-0.9 MG/250ML-% IV SOLN
INTRAVENOUS | Status: DC | PRN
  Administered 2022-02-18: 20 ug/min via INTRAVENOUS

## 2022-02-18 MED ORDER — FENTANYL CITRATE (PF) 250 MCG/5ML IJ SOLN
INTRAMUSCULAR | Status: DC | PRN
  Administered 2022-02-18: 50 ug via INTRAVENOUS
  Administered 2022-02-18: 100 ug via INTRAVENOUS
  Administered 2022-02-18: 50 ug via INTRAVENOUS
  Administered 2022-02-18 (×2): 25 ug via INTRAVENOUS

## 2022-02-18 MED ORDER — LIDOCAINE-EPINEPHRINE 1 %-1:100000 IJ SOLN
INTRAMUSCULAR | Status: DC | PRN
  Administered 2022-02-18: 5 mL

## 2022-02-18 MED ORDER — EPHEDRINE 5 MG/ML INJ
INTRAVENOUS | Status: AC
  Filled 2022-02-18: qty 5

## 2022-02-18 MED ORDER — LACTATED RINGERS IV SOLN: INTRAVENOUS | Status: DC

## 2022-02-18 MED ORDER — ALUM & MAG HYDROXIDE-SIMETH 200-200-20 MG/5ML PO SUSP: 30.0000 mL | Freq: Four times a day (QID) | ORAL | Status: DC | PRN

## 2022-02-18 MED ORDER — LIDOCAINE 2% (20 MG/ML) 5 ML SYRINGE
INTRAMUSCULAR | Status: AC
  Filled 2022-02-18: qty 5

## 2022-02-18 MED ORDER — BUPIVACAINE HCL (PF) 0.5 % IJ SOLN
INTRAMUSCULAR | Status: AC
Start: 2022-02-18 — End: ?
  Filled 2022-02-18: qty 30

## 2022-02-18 MED ORDER — LORATADINE 10 MG PO TABS
10.0000 mg | ORAL_TABLET | Freq: Every day | ORAL | Status: DC
  Filled 2022-02-18: qty 1

## 2022-02-18 MED ORDER — SODIUM CHLORIDE 0.9% FLUSH
3.0000 mL | Freq: Two times a day (BID) | INTRAVENOUS | Status: DC
  Administered 2022-02-18: 3 mL via INTRAVENOUS

## 2022-02-18 MED ORDER — ACETAMINOPHEN 650 MG RE SUPP: 650.0000 mg | RECTAL | Status: DC | PRN

## 2022-02-18 MED ORDER — PHENOL 1.4 % MT LIQD: 1.0000 | OROMUCOSAL | Status: DC | PRN

## 2022-02-18 MED ORDER — ONDANSETRON HCL 4 MG PO TABS: 4.0000 mg | ORAL_TABLET | Freq: Four times a day (QID) | ORAL | Status: DC | PRN

## 2022-02-18 MED ORDER — ACETAMINOPHEN 500 MG PO TABS: 1000.0000 mg | ORAL_TABLET | Freq: Every day | ORAL | Status: DC

## 2022-02-18 MED ORDER — ONDANSETRON HCL 4 MG/2ML IJ SOLN
INTRAMUSCULAR | Status: DC | PRN
  Administered 2022-02-18: 4 mg via INTRAVENOUS

## 2022-02-18 MED ORDER — PROMETHAZINE HCL 25 MG/ML IJ SOLN: 6.2500 mg | INTRAMUSCULAR | Status: DC | PRN

## 2022-02-18 MED ORDER — VALACYCLOVIR HCL 500 MG PO TABS
500.0000 mg | ORAL_TABLET | Freq: Every day | ORAL | Status: DC
  Administered 2022-02-18 – 2022-02-19 (×2): 500 mg via ORAL
  Filled 2022-02-18 (×3): qty 1

## 2022-02-18 MED ORDER — ONDANSETRON HCL 4 MG/2ML IJ SOLN
4.0000 mg | Freq: Four times a day (QID) | INTRAMUSCULAR | Status: DC | PRN
Start: 2022-02-18 — End: 2022-02-20

## 2022-02-18 MED ORDER — HYDROMORPHONE HCL 1 MG/ML IJ SOLN: 0.2500 mg | INTRAMUSCULAR | Status: DC | PRN

## 2022-02-18 MED ORDER — 0.9 % SODIUM CHLORIDE (POUR BTL) OPTIME
TOPICAL | Status: DC | PRN
  Administered 2022-02-18: 1000 mL

## 2022-02-18 MED ORDER — METHOCARBAMOL 1000 MG/10ML IJ SOLN: 500.0000 mg | Freq: Four times a day (QID) | INTRAVENOUS | Status: DC | PRN

## 2022-02-18 MED ORDER — PHENYLEPHRINE 80 MCG/ML (10ML) SYRINGE FOR IV PUSH (FOR BLOOD PRESSURE SUPPORT)
PREFILLED_SYRINGE | INTRAVENOUS | Status: AC
  Filled 2022-02-18: qty 10

## 2022-02-18 MED ORDER — CEFAZOLIN SODIUM-DEXTROSE 2-4 GM/100ML-% IV SOLN
2.0000 g | Freq: Three times a day (TID) | INTRAVENOUS | Status: AC
  Administered 2022-02-18 (×2): 2 g via INTRAVENOUS
  Filled 2022-02-18 (×2): qty 100

## 2022-02-18 MED ORDER — ORAL CARE MOUTH RINSE: 15.0000 mL | OROMUCOSAL | Status: DC | PRN

## 2022-02-18 MED ORDER — SUGAMMADEX SODIUM 200 MG/2ML IV SOLN
INTRAVENOUS | Status: DC | PRN
  Administered 2022-02-18: 200 mg via INTRAVENOUS

## 2022-02-18 MED ORDER — METHOCARBAMOL 500 MG PO TABS
500.0000 mg | ORAL_TABLET | Freq: Four times a day (QID) | ORAL | Status: DC | PRN
  Administered 2022-02-19 – 2022-02-20 (×5): 500 mg via ORAL
  Filled 2022-02-18 (×5): qty 1

## 2022-02-18 MED ORDER — ATROPINE SULFATE 0.4 MG/ML IV SOLN
INTRAVENOUS | Status: AC
  Filled 2022-02-18: qty 1

## 2022-02-18 MED ORDER — FENTANYL CITRATE (PF) 250 MCG/5ML IJ SOLN
INTRAMUSCULAR | Status: AC
  Filled 2022-02-18: qty 5

## 2022-02-18 MED ORDER — THROMBIN 5000 UNITS EX SOLR
CUTANEOUS | Status: AC
  Filled 2022-02-18: qty 5000

## 2022-02-18 MED ORDER — ONDANSETRON HCL 4 MG/2ML IJ SOLN
INTRAMUSCULAR | Status: AC
  Filled 2022-02-18: qty 2

## 2022-02-18 MED ORDER — DEXAMETHASONE SODIUM PHOSPHATE 10 MG/ML IJ SOLN
INTRAMUSCULAR | Status: DC | PRN
  Administered 2022-02-18: 10 mg via INTRAVENOUS

## 2022-02-18 MED ORDER — HYDROMORPHONE HCL 1 MG/ML IJ SOLN: 1.0000 mg | INTRAMUSCULAR | Status: DC | PRN

## 2022-02-18 MED ORDER — MIDAZOLAM HCL 2 MG/2ML IJ SOLN
INTRAMUSCULAR | Status: AC
  Filled 2022-02-18: qty 2

## 2022-02-18 MED ORDER — DEXMEDETOMIDINE (PRECEDEX) IN NS 20 MCG/5ML (4 MCG/ML) IV SYRINGE
PREFILLED_SYRINGE | INTRAVENOUS | Status: DC | PRN
  Administered 2022-02-18 (×2): 4 ug via INTRAVENOUS

## 2022-02-18 MED ORDER — ROCURONIUM BROMIDE 10 MG/ML (PF) SYRINGE
PREFILLED_SYRINGE | INTRAVENOUS | Status: AC
  Filled 2022-02-18: qty 20

## 2022-02-18 MED ORDER — ORAL CARE MOUTH RINSE: 15.0000 mL | Freq: Once | OROMUCOSAL | Status: AC

## 2022-02-18 MED ORDER — DOCUSATE SODIUM 100 MG PO CAPS
100.0000 mg | ORAL_CAPSULE | Freq: Two times a day (BID) | ORAL | Status: DC
  Filled 2022-02-18 (×3): qty 1

## 2022-02-18 MED ORDER — FLEET ENEMA 7-19 GM/118ML RE ENEM: 1.0000 | ENEMA | Freq: Once | RECTAL | Status: DC | PRN

## 2022-02-18 MED ORDER — GLYCOPYRROLATE PF 0.2 MG/ML IJ SOSY
PREFILLED_SYRINGE | INTRAMUSCULAR | Status: AC
  Filled 2022-02-18: qty 1

## 2022-02-18 MED ORDER — LIDOCAINE-EPINEPHRINE 1 %-1:100000 IJ SOLN
INTRAMUSCULAR | Status: AC
  Filled 2022-02-18: qty 1

## 2022-02-18 MED ORDER — CHLORHEXIDINE GLUCONATE 0.12 % MT SOLN
OROMUCOSAL | Status: AC
  Administered 2022-02-18: 15 mL via OROMUCOSAL
  Filled 2022-02-18: qty 15

## 2022-02-18 MED ORDER — OXYCODONE-ACETAMINOPHEN 5-325 MG PO TABS
1.0000 | ORAL_TABLET | ORAL | Status: DC | PRN
  Administered 2022-02-18 – 2022-02-19 (×3): 1 via ORAL
  Administered 2022-02-19 – 2022-02-20 (×7): 2 via ORAL
  Filled 2022-02-18: qty 1
  Filled 2022-02-18 (×6): qty 2
  Filled 2022-02-18: qty 1
  Filled 2022-02-18 (×2): qty 2

## 2022-02-18 MED ORDER — MIDAZOLAM HCL 2 MG/2ML IJ SOLN
INTRAMUSCULAR | Status: DC | PRN
  Administered 2022-02-18: 2 mg via INTRAVENOUS

## 2022-02-18 MED ORDER — CEFAZOLIN SODIUM-DEXTROSE 2-4 GM/100ML-% IV SOLN
2.0000 g | INTRAVENOUS | Status: AC
  Administered 2022-02-18: 2 g via INTRAVENOUS

## 2022-02-18 MED ORDER — BISACODYL 10 MG RE SUPP: 10.0000 mg | Freq: Every day | RECTAL | Status: DC | PRN

## 2022-02-18 MED ORDER — LEVOTHYROXINE SODIUM 25 MCG PO TABS
50.0000 ug | ORAL_TABLET | Freq: Every day | ORAL | Status: DC
  Administered 2022-02-18 – 2022-02-19 (×2): 50 ug via ORAL
  Filled 2022-02-18 (×3): qty 2

## 2022-02-18 MED ORDER — DEXAMETHASONE SODIUM PHOSPHATE 10 MG/ML IJ SOLN
INTRAMUSCULAR | Status: AC
  Filled 2022-02-18: qty 1

## 2022-02-18 MED ORDER — MECLIZINE HCL 25 MG PO TABS: 25.0000 mg | ORAL_TABLET | Freq: Three times a day (TID) | ORAL | Status: DC | PRN

## 2022-02-18 MED ORDER — POLYETHYLENE GLYCOL 3350 17 G PO PACK: 17.0000 g | PACK | Freq: Every day | ORAL | Status: DC | PRN

## 2022-02-18 MED ORDER — SENNA 8.6 MG PO TABS
1.0000 | ORAL_TABLET | Freq: Two times a day (BID) | ORAL | Status: DC
  Filled 2022-02-18 (×2): qty 1

## 2022-02-18 MED ORDER — PROPOFOL 10 MG/ML IV BOLUS
INTRAVENOUS | Status: DC | PRN
  Administered 2022-02-18: 140 mg via INTRAVENOUS
  Administered 2022-02-18: 50 mg via INTRAVENOUS

## 2022-02-18 MED ORDER — SODIUM CHLORIDE 0.9 % IV SOLN: 250.0000 mL | INTRAVENOUS | Status: DC

## 2022-02-18 MED ORDER — MENTHOL 3 MG MT LOZG: 1.0000 | LOZENGE | OROMUCOSAL | Status: DC | PRN

## 2022-02-18 MED ORDER — CEFAZOLIN SODIUM-DEXTROSE 2-4 GM/100ML-% IV SOLN
INTRAVENOUS | Status: AC
  Filled 2022-02-18: qty 100

## 2022-02-18 MED ORDER — PHENYLEPHRINE 80 MCG/ML (10ML) SYRINGE FOR IV PUSH (FOR BLOOD PRESSURE SUPPORT)
PREFILLED_SYRINGE | INTRAVENOUS | Status: DC | PRN
  Administered 2022-02-18: 240 ug via INTRAVENOUS

## 2022-02-18 SURGICAL SUPPLY — 70 items
BAG COUNTER SPONGE SURGICOUNT (BAG) ×1 IMPLANT
BASKET BONE COLLECTION (BASKET) ×1 IMPLANT
BLADE BONE MILL MEDIUM (MISCELLANEOUS) ×1 IMPLANT
BLADE CLIPPER SURG (BLADE) IMPLANT
BONE CANC CHIPS 20CC PCAN1/4 (Bone Implant) ×1 IMPLANT
BUR MATCHSTICK NEURO 3.0 LAGG (BURR) ×1 IMPLANT
CAGE COROENT LG 10X9X23-12 (Cage) IMPLANT
CAGE COROENT MP 8X9X23M-8 SPIN (Cage) IMPLANT
CANISTER SUCT 3000ML PPV (MISCELLANEOUS) ×1 IMPLANT
CHIPS CANC BONE 20CC PCAN1/4 (Bone Implant) ×1 IMPLANT
CNTNR URN SCR LID CUP LEK RST (MISCELLANEOUS) ×1 IMPLANT
CONT SPEC 4OZ STRL OR WHT (MISCELLANEOUS) ×1
COVER BACK TABLE 60X90IN (DRAPES) ×1 IMPLANT
DERMABOND IMPLANT
DERMABOND ADVANCED (GAUZE/BANDAGES/DRESSINGS) ×1
DERMABOND ADVANCED .7 DNX12 (GAUZE/BANDAGES/DRESSINGS) ×1 IMPLANT
DEVICE DISSECT PLASMABLAD 3.0S (MISCELLANEOUS) ×1 IMPLANT
DRAPE C-ARM 42X72 X-RAY (DRAPES) ×2 IMPLANT
DRAPE HALF SHEET 40X57 (DRAPES) IMPLANT
DRAPE LAPAROTOMY 100X72X124 (DRAPES) ×1 IMPLANT
DRSG OPSITE POSTOP 4X6 (GAUZE/BANDAGES/DRESSINGS) IMPLANT
DURAPREP 26ML APPLICATOR (WOUND CARE) ×1 IMPLANT
DURASEAL APPLICATOR TIP (TIP) IMPLANT
DURASEAL SPINE SEALANT 3ML (MISCELLANEOUS) IMPLANT
ELECT REM PT RETURN 9FT ADLT (ELECTROSURGICAL) ×1
ELECTRODE REM PT RTRN 9FT ADLT (ELECTROSURGICAL) ×1 IMPLANT
GAUZE 4X4 16PLY ~~LOC~~+RFID DBL (SPONGE) IMPLANT
GAUZE SPONGE 4X4 12PLY STRL (GAUZE/BANDAGES/DRESSINGS) ×1 IMPLANT
GLOVE BIOGEL PI IND STRL 8.5 (GLOVE) ×2 IMPLANT
GLOVE BIOGEL PI INDICATOR 8.5 (GLOVE) ×2
GLOVE ECLIPSE 8.5 STRL (GLOVE) ×2 IMPLANT
GOWN STRL REUS W/ TWL LRG LVL3 (GOWN DISPOSABLE) IMPLANT
GOWN STRL REUS W/ TWL XL LVL3 (GOWN DISPOSABLE) IMPLANT
GOWN STRL REUS W/TWL 2XL LVL3 (GOWN DISPOSABLE) ×2 IMPLANT
GOWN STRL REUS W/TWL LRG LVL3 (GOWN DISPOSABLE) ×2
GOWN STRL REUS W/TWL XL LVL3 (GOWN DISPOSABLE) ×1
GRAFT BNE CANC CHIPS 1-8 20CC (Bone Implant) IMPLANT
GRAFT BONE PROTEIOS XL 10CC (Orthopedic Implant) IMPLANT
HEMOSTAT POWDER KIT SURGIFOAM (HEMOSTASIS) IMPLANT
KIT BASIN OR (CUSTOM PROCEDURE TRAY) ×1 IMPLANT
KIT GRAFTMAG DEL NEURO DISP (NEUROSURGERY SUPPLIES) IMPLANT
KIT TURNOVER KIT B (KITS) ×1 IMPLANT
MILL BONE PREP (MISCELLANEOUS) ×1 IMPLANT
NDL SPNL 18GX3.5 QUINCKE PK (NEEDLE) IMPLANT
NEEDLE HYPO 22GX1.5 SAFETY (NEEDLE) ×1 IMPLANT
NEEDLE SPNL 18GX3.5 QUINCKE PK (NEEDLE) IMPLANT
NS IRRIG 1000ML POUR BTL (IV SOLUTION) ×1 IMPLANT
PACK LAMINECTOMY NEURO (CUSTOM PROCEDURE TRAY) ×1 IMPLANT
PAD ARMBOARD 7.5X6 YLW CONV (MISCELLANEOUS) ×3 IMPLANT
PATTIES SURGICAL .5 X1 (DISPOSABLE) ×1 IMPLANT
PLASMABLADE 3.0S (MISCELLANEOUS) ×1
ROD RELINE-O LORDOTIC 5.5X60MM (Rod) IMPLANT
SCREW LOCK RELINE 5.5 TULIP (Screw) IMPLANT
SCREW RELINE-O POLY 6.5X45 (Screw) IMPLANT
SPIKE FLUID TRANSFER (MISCELLANEOUS) ×1 IMPLANT
SPONGE SURGIFOAM ABS GEL 100 (HEMOSTASIS) ×1 IMPLANT
SPONGE T-LAP 4X18 ~~LOC~~+RFID (SPONGE) IMPLANT
SUT PROLENE 6 0 BV (SUTURE) IMPLANT
SUT VIC AB 1 CT1 18XBRD ANBCTR (SUTURE) ×1 IMPLANT
SUT VIC AB 1 CT1 8-18 (SUTURE) ×1
SUT VIC AB 2-0 CP2 18 (SUTURE) ×1 IMPLANT
SUT VIC AB 3-0 SH 8-18 (SUTURE) ×1 IMPLANT
SUT VIC AB 4-0 RB1 18 (SUTURE) ×1 IMPLANT
SYR 3ML LL SCALE MARK (SYRINGE) ×4 IMPLANT
SYR 5ML LL (SYRINGE) IMPLANT
TOWEL GREEN STERILE (TOWEL DISPOSABLE) ×1 IMPLANT
TOWEL GREEN STERILE FF (TOWEL DISPOSABLE) ×1 IMPLANT
TRAY FOLEY MTR SLVR 14FR STAT (SET/KITS/TRAYS/PACK) IMPLANT
TRAY FOLEY MTR SLVR 16FR STAT (SET/KITS/TRAYS/PACK) ×1 IMPLANT
WATER STERILE IRR 1000ML POUR (IV SOLUTION) ×1 IMPLANT

## 2022-02-18 NOTE — Anesthesia Postprocedure Evaluation (Signed)
Anesthesia Post Note  Patient: Tonya Ingram  Procedure(s) Performed: Lumbar three-four, Lumbar four-five/ Posterior Lumbar Interbody Fusion (Back)     Patient location during evaluation: PACU Anesthesia Type: General Level of consciousness: awake and sedated Pain management: pain level controlled Vital Signs Assessment: post-procedure vital signs reviewed and stable Respiratory status: spontaneous breathing Cardiovascular status: stable Postop Assessment: no apparent nausea or vomiting Anesthetic complications: no   No notable events documented.  Last Vitals:  Vitals:   02/18/22 1400 02/18/22 1429  BP: 113/63 118/65  Pulse: 68 (!) 58  Resp: 12 18  Temp:    SpO2: 97% 98%    Last Pain:  Vitals:   02/18/22 1540  TempSrc:   PainSc: 5                  John [address]

## 2022-02-18 NOTE — Transfer of Care (Signed)
Immediate Anesthesia Transfer of Care Note  Patient: Tonya Ingram  Procedure(s) Performed: Lumbar three-four, Lumbar four-five/ Posterior Lumbar Interbody Fusion (Back)  Patient Location: PACU  Anesthesia Type:General  Level of Consciousness: awake and drowsy  Airway & Oxygen Therapy: Patient Spontanous Breathing and Patient connected to face mask oxygen  Post-op Assessment: Report given to RN and Post -op Vital signs reviewed and stable  Post vital signs: Reviewed and stable  Last Vitals:  Vitals Value Taken Time  BP 133/77 02/18/22 1245  Temp 37 C 02/18/22 1240  Pulse 73 02/18/22 1248  Resp 14 02/18/22 1248  SpO2 97 % 02/18/22 1248  Vitals shown include unvalidated device data.  Last Pain:  Vitals:   02/18/22 0557  TempSrc:   PainSc: 3       Patients Stated Pain Goal: 1 (57/84/69 [id number])  Complications: No notable events documented.

## 2022-02-18 NOTE — Progress Notes (Signed)
Orthopedic Tech Progress Note Patient Details:  Tonya Ingram August 20, 1950 [id number]  Ortho Devices Type of Ortho Device: Lumbar corsett Ortho Device/Splint Location: BACK Ortho Device/Splint Interventions: Ordered, Adjustment   Post Interventions Patient Tolerated: Well Instructions Provided: Care of device  Janit Pagan 02/18/2022, 1:13 PM

## 2022-02-18 NOTE — H&P (Signed)
CHIEF COMPLAINT: Back pain, buttock pain, lower extremity pain for about 2 years.  HISTORY OF PRESENT ILLNESS: Tonya Ingram is a 71 year old right-handed individual who tells me that she has been having progressively worsening back pain for a couple of years time.  She notes that in the interval she has also had some hip issues. In January 2021 she underwent a left total hip replacement. She has been having some back issues and this has been evaluated with 2 MRIs. The most recent one this past year demonstrates that she has a spondylolisthesis, grade 1, at the level of L3-4 and a grade 1-2 spondylolisthesis at L4-L5 with significant lateral recess stenosis and central canal stenosis.  She has mild-to-moderate stenosis at the level of L3-L4.  The rest of her spine appears to line up quite normally with an amply patent canal.  No evidence of any canal or nerve root compromise elsewhere.  She is seen now for further evaluation of this process, and she notes that she has had a number of injections done by Dr. Nelia Shi at [facility].  These have helped to mitigate the pain but have become progressively less effective over time.  She just recently had a bilateral transforaminal injection at the level of L4-L5, and she believes that this has given her reasonably good relief at the current time.  She notes that she enjoys staying physically active, working out regularly and bike riding, walking, swimming, and doing some running on occasion.  She notes that she has given up on the running pretty much, but otherwise enjoys a routine of vigorous exercise activity on a daily basis.  PAST MEDICAL HISTORY: Reveals that her general health has been good, without any significant medical issues.  REVIEW OF SYSTEMS: Except for the items in the History of Present Illness is essentially negative.  PHYSICAL EXAMINATION: I note that she walks straight and erect and demonstrates good strength in the iliopsoas,  quadriceps, tibialis anterior, and the gastrocs as noted by her capacity for toe and heel walking.  IMAGING: Today in the office, to further her workup, I obtained a lateral flexion-extension x-ray.  This reveals that she has a mobile spondylolisthesis at L4-5 with extension films showing 10 mm anterolisthesis and flexion films showing the anterolisthesis increasing to 16 mm.  At L3-4, she has a spondylolisthesis that goes from 4 mm to 9 mm.  The coronal alignment is excellent, and the sagittal alignment reveals that she has a decrease in the normal lordosis of the lumbar spine because of the spondylolisthesis at those 2 levels.  IMPRESSION: Rudell demonstrates that she has 2-level spondylolisthesis at L3-4 and L4-5. On the MRI, she has mild-to-moderate stenosis at L3-4 but a moderate-to-severe central and lateral recess stenosis at L4-L5 that likely aggravate symptoms into the L4 and the L5 nerve roots.  This creates the buttock pain that radiates into the lower extremities.  Neurologically, Tonya Ingram appears intact, and I discussed with her that as long as she is responding to the conservative treatment, she can continue with that.  However, in light of the fact that the treatment has been increasingly refractory, I believe that she may need to appropriately consider surgical decompression and arthrodesis at L3-4 and L4-5.  I demonstrated on a model and discussed the surgical intervention with her via bilateral posterior interbody fusion with decompression of the L3, L4, and L5 nerve roots at both these levels.  I noted that she would have 6 pedicle screws placed with 2 rods between them  to hold the vertebrae in position.  Surgery typically takes approximately 4 hours in the operating room. Most patients are in the hospital for 2 days or less.  Some patients can go home after a 24-hour period, and during the time that they are out of bed, I request that they use an external corset to help maintain the  posture in her back.  The importance of this is to allow the screws to set into the bone that they have been placed at L3, L4, and L5, and generally after 6 weeks that has occurred and patients can come out of the brace and slowly become more mobile. Restoration of more full activity occurs after 3 months from surgery, and during that first 53-monthperiod, I ask that they be cautious with any bending and lifting activities with the first 6 weeks being the most critical. Most individuals require some narcotic analgesia for the first week or 2 weeks, but thereafter they can get by with either Tylenol or Advil as a nonsteroidal anti-inflammatory to help control residual pain. In Ms. Soulier's situation, if there were other conservative efforts that could help to mitigate the pain, she can continue to do them.  I have advised that in the meantime we should try some formal physical therapy for lumbar spine to see if an exercise program could help mitigate the symptoms also. I have suggested that she visit with JBarbaraann Barthelto see if a program of some McKenzie-type exercises could help to mitigate this situation for her.  We will establish an appointment for that.  We will also have my scheduling secretary, JJanett Billow reach out to her to discuss some surgical options for dates and times.

## 2022-02-18 NOTE — Anesthesia Procedure Notes (Addendum)
Procedure Name: Intubation Date/Time: 02/18/2022 8:12 AM  Performed by: Lorie Phenix, CRNAPre-anesthesia Checklist: Patient identified, Emergency Drugs available, Suction available and Patient being monitored Patient Re-evaluated:Patient Re-evaluated prior to induction Oxygen Delivery Method: Circle system utilized Preoxygenation: Pre-oxygenation with 100% oxygen Induction Type: IV induction Ventilation: Mask ventilation without difficulty Laryngoscope Size: Mac and 3 Grade View: Grade I Tube type: Oral Tube size: 7.0 mm Number of attempts: 1 Airway Equipment and Method: Stylet Placement Confirmation: ETT inserted through vocal cords under direct vision, positive ETCO2 and breath sounds checked- equal and bilateral Secured at: 22 cm Tube secured with: Tape Dental Injury: Teeth and Oropharynx as per pre-operative assessment  Comments: Intubation performed by Lissa Hoard, SRNA under direct supervision from CRNA and MDA.

## 2022-02-18 NOTE — Op Note (Signed)
Date of surgery: 02/18/2022 Preoperative diagnosis: Spondylolisthesis L3-4 and L4-5 with lumbar spinal stenosis.  Neurogenic claudication.  Lumbar radiculopathy. Postoperative diagnosis: Same Procedure: Lumbar laminectomy L3-4 and L4-5 with decompression of the L3 the L4 the L5 nerve roots with more work than required for simple interbody technique.  Posterior lumbar interbody arthrodesis with local autograft allograft and Proteus with peek spacers at L3-4 and L4-5.  Posterolateral arthrodesis with local autograft allograft and Proteus L3-4 and L4-5 pedicle screw fixation L3-L5 with fluoroscopic guidance for placement of pedicle screws. Surgeon: Kristeen Miss, MD First Assistant: Sherley Bounds, MD Anesthesia: General endotracheal Indications: The patient is a 71 year old individuals had progressive pain and weakness in both lower extremities she has evolved a significant spondylolisthesis at L3-4 and L4-5 with severe central and lateral recess stenosis at each of these levels.  Having failed efforts at conservative management she was advised regarding surgical decompression and stabilization of L3-L5.  Procedure: The patient was brought to the operating room supine on the stretcher.  After the smooth induction of general tracheal anesthesia, she was carefully turned prone.  The back was prepped with alcohol and DuraPrep and draped in a sterile fashion.  Midline incision was created and carried down to the lumbodorsal fascia.  Fascia was opened on either side of midline to expose the first interspace at L3-4 and L4-5 which were marked positively on a radiograph.  Then dissection was carried out over the facet joints at L4-5 and L3-4 and the transverse processes of L3-L4 and L5 were isolated and packed off for later use and grafting.  Laminectomy was then created removing the inferior margin lamina of L3 out to including the entirety of the facet at L3-4.  Then a complete laminectomy at L4 was performed this  exposed the common dural tube and carefully we then dissected out the L5 the L4 and the L3 nerve roots in their passage out beyond the foramen.  Once this decompression was obtained the disc spaces were isolated at L3-4 and L4-5.  Then a complete discectomy was performed first at L3-4 then at L4-5 once an adequate discectomy was started and interbody's Brader was used to open the disc space further and to allow for some reduction of the spondylolisthesis at L3-4 and L4-5.  Using a series of dilators and disc shavers were able to clean the entirety of the disc out and once evacuated the endplates were decorticated and the interbody space was sized for an 8 x 9 x 23 mm spacer with 8 degrees lordosis at L3-4.  This was packed into the interspace along with 9 cc of bone graft.  L4-5 complete discectomy was also performed but here a 10 x 9 x 23 mm spacer with 12 degrees of lordosis was placed bilaterally along with 12 cc of bone graft into the interspace.  Once the interbody grafting was completed lateral gutters which had previously been decorticated were packed with an additional 6 cc of bone graft on each side at L3-4 and L4-5.  Pedicle entry sites were then chosen at L3-L4 and L5 and these were checked radiographically and 6.5 x 45 mm pedicle screws were placed at L3-L4 and L5.  5.5 x 60 mm precontoured rods were then fit between the saddles of the screws in the neutral construct.  There were torqued down in a neutral construct and final radiographs identified good decompression of the interspaces restoration of height of the disc spaces reduction of the spondylolisthesis and good overall alignment in both the  coronal and sagittal planes.  With this hemostasis in the soft tissues was obtained meticulously care was taken to make sure that the individual nerve roots L3-L4 and L5 were well decompressed as was the central canal the lateral fascial tissues were infiltrated with 25 cc of half percent Marcaine and the  fascia was then finally closed with #1 Vicryl in interrupted fashion 2-0 Vicryl in the subcutaneous tissues 3-0 Vicryl subcuticularly along with a final closure layer of 4-0 Vicryl Dermabond was placed on the skin.  Blood loss was noted to be 400 cc with 125 cc of Cell Saver blood being returned to the patient.

## 2022-02-19 DIAGNOSIS — M4316 Spondylolisthesis, lumbar region: Secondary | ICD-10-CM | POA: Diagnosis not present

## 2022-02-19 LAB — BASIC METABOLIC PANEL
Anion gap: 6 (ref 5–15)
BUN: 13 mg/dL (ref 8–23)
CO2: 27 mmol/L (ref 22–32)
Calcium: 8.8 mg/dL — ABNORMAL LOW (ref 8.9–10.3)
Chloride: 106 mmol/L (ref 98–111)
Creatinine, Ser: 0.76 mg/dL (ref 0.44–1.00)
GFR, Estimated: >60 mL/min (ref >60)
Glucose, Bld: 93 mg/dL (ref 70–99)
Potassium: 3.9 mmol/L (ref 3.5–5.1)
Sodium: 139 mmol/L (ref 135–145)

## 2022-02-19 LAB — CBC
HCT: 29.2 % — ABNORMAL LOW (ref 36.0–46.0)
Hemoglobin: 10 g/dL — ABNORMAL LOW (ref 12.0–15.0)
MCH: 33.2 pg (ref 26.0–34.0)
MCHC: 34.2 g/dL (ref 30.0–36.0)
MCV: 97 fL (ref 80.0–100.0)
Platelets: 212 10*3/uL (ref 150–400)
RBC: 3.01 MIL/uL — ABNORMAL LOW (ref 3.87–5.11)
RDW: 12.7 % (ref 11.5–15.5)
WBC: 7.5 10*3/uL (ref 4.0–10.5)
nRBC: 0 % (ref 0.0–0.2)

## 2022-02-19 NOTE — Evaluation (Signed)
Occupational Therapy Evaluation Patient Details Name: Tonya Ingram MRN: [id number] DOB: 15-Jul-1950 Today's Date: 02/19/2022   History of Present Illness 71 y.o. female s/p L 3-5 Posterior Lumbar Interbody Fusion. PMH signitifant for L total hip replacement.   Clinical Impression   PTA, pt lived alone and was independent in ADL, IADL, and driving. Upon eval, pt performing ADL at supervision level, with exception of tub transfer requiring min guard A for safety. Pt educated and demonstrating compensatory techniques for LB dressing, brace application, bed mobility, oral care, and tub transfer within precautions. Pt reports sister will be available to help as needed upon discharge. All questions answered; all education provided. Recommend discharge home with sister with no OT follow up at this time. OT to sign off. Re-consult if change in status.      Recommendations for follow up therapy are one component of a multi-disciplinary discharge planning process, led by the attending physician.  Recommendations may be updated based on patient status, additional functional criteria and insurance authorization.   Follow Up Recommendations  No OT follow up    Assistance Recommended at Discharge Intermittent Supervision/Assistance  Patient can return home with the following A little help with walking and/or transfers;Assistance with cooking/housework;Assist for transportation;Help with stairs or ramp for entrance    Functional Status Assessment  Patient has had a recent decline in their functional status and demonstrates the ability to make significant improvements in function in a reasonable and predictable amount of time.  Equipment Recommendations  BSC/3in1 (RN notified to check with pt's sister on equipment the family already has)    Recommendations for Other Services PT consult     Precautions / Restrictions Precautions Precautions: Back Precaution Booklet Issued: Yes (comment) Precaution  Comments: All edeucation reviewed Required Braces or Orthoses: Spinal Brace Spinal Brace: Lumbar corset;Applied in sitting position Restrictions Weight Bearing Restrictions: No      Mobility Bed Mobility               General bed mobility comments: Pt EOB on arrival and departure. Pt verbalizing log roll technique as pt was educated by nursing staff to get OOB yesterday and this morning    Transfers Overall transfer level: Needs assistance Equipment used: None Transfers: Sit to/from Stand Sit to Stand: Supervision           General transfer comment: Supervision for safety. Pt with guarded movement due to pain      Balance Overall balance assessment: Mild deficits observed, not formally tested (shuffled gait)                                         ADL either performed or assessed with clinical judgement   ADL Overall ADL's : Needs assistance/impaired Eating/Feeding: Modified independent   Grooming: Oral care;Supervision/safety;Standing Grooming Details (indicate cue type and reason): Perfroming within precautions during session after initial education Upper Body Bathing: Set up;Sitting   Lower Body Bathing: Supervison/ safety;Sit to/from stand   Upper Body Dressing : Set up;Sitting Upper Body Dressing Details (indicate cue type and reason): Pt verbalizing brace application. Lower Body Dressing: Supervision/safety;Sit to/from stand Lower Body Dressing Details (indicate cue type and reason): Perfroming during session within precautions after initial education Toilet Transfer: Supervision/safety;Comfort height toilet Toilet Transfer Details (indicate cue type and reason): simulated in room with bed on lowest setting Toileting- Clothing Manipulation and Hygiene: Supervision/safety   Tub/ Shower Transfer: [employer]  guard;Tub transfer;Shower Scientist, research (medical) Details (indicate cue type and reason): Perfroming with min guard A for safety. No hands  on assist Functional mobility during ADLs: Supervision/safety General ADL Comments: Pt with pain, but overall requiruing supervision for safety during session with no hands on assist     Vision Baseline Vision/History: 1 Wears glasses Ability to See in Adequate Light: 0 Adequate Patient Visual Report: No change from baseline Vision Assessment?: No apparent visual deficits Additional Comments: Pt scanning to locate items in room.     Perception     Praxis      Pertinent Vitals/Pain Pain Assessment Pain Assessment: Faces Faces Pain Scale: Hurts little more Pain Location: operative site Pain Descriptors / Indicators: Operative site guarding, Discomfort Pain Intervention(s): Limited activity within patient's tolerance, Monitored during session     Hand Dominance     Extremity/Trunk Assessment Upper Extremity Assessment Upper Extremity Assessment: Overall WFL for tasks assessed (R index finger partial amputation. Pt reporting this has changed her dexterity to brush teeth, but performing without assist.)   Lower Extremity Assessment Lower Extremity Assessment: Defer to PT evaluation   Cervical / Trunk Assessment Cervical / Trunk Assessment: Back Surgery   Communication Communication Communication: No difficulties   Cognition Arousal/Alertness: Awake/alert Behavior During Therapy: WFL for tasks assessed/performed Overall Cognitive Status: Within Functional Limits for tasks assessed                                 General Comments: Recalling precautions and following commands. No additional cues for new learning     General Comments  VSS. Pt reporting she will not need a BSC if her sister has one. Updated nursing staff to ask upon sister arrival    Exercises     Shoulder Instructions      Home Living Family/patient expects to be discharged to:: Private residence Living Arrangements: Alone Available Help at Discharge: Family Type of Home:  Apartment Home Access: Stairs to enter Technical brewer of Steps: 15 Entrance Stairs-Rails: Can reach both [place]: One level     Bathroom Shower/Tub: Teacher, early years/pre: [place]: Conservation officer, nature (2 wheels);Cane - single point   Additional Comments: Plans to stay with sister who has a tub shower, and stair lift      Prior Functioning/Environment Prior Level of Function : Independent/Modified Independent;Driving             Mobility Comments: Pt reports no AD and that her gait is shuffled at baseline ADLs Comments: reports independent in ADL, IADL, and driving        OT Problem List: Decreased strength;Decreased activity tolerance;Impaired balance (sitting and/or standing);Decreased knowledge of precautions;Decreased knowledge of use of DME or AE;Pain      OT Treatment/Interventions:      OT Goals(Current goals can be found in the care plan section) Acute Rehab OT Goals Patient Stated Goal: Decreased pain OT Goal Formulation: With patient  OT Frequency:      Co-evaluation              AM-PAC OT "6 Clicks" Daily Activity     Outcome Measure Help from another person eating meals?: None Help from another person taking care of personal grooming?: A Little Help from another person toileting, which includes using toliet, bedpan, or urinal?: A Little Help from another person bathing (including washing, rinsing, drying)?: A Little Help from another person  to put on and taking off regular upper body clothing?: A Little Help from another person to put on and taking off regular lower body clothing?: A Little 6 Click Score: 19   End of Session Equipment Utilized During Treatment: Gait belt Nurse Communication: Mobility status  Activity Tolerance: Patient tolerated treatment well Patient left: in bed;with call bell/phone within reach (sitting EOB)  OT Visit Diagnosis: Unsteadiness on feet (R26.81);Muscle weakness  (generalized) (M62.81);Pain;Other abnormalities of gait and mobility (R26.89) Pain - part of body:  (operative site)                Time: 2230-0979 OT Time Calculation (min): 18 min Charges:  OT General Charges $OT Visit: 1 Visit OT Evaluation $OT Eval Low Complexity: 1 Low  Shanda Howells, OTR/L [facility] Acute Rehabilitation Office: [phone]   Lula Olszewski 02/19/2022, 8:40 AM

## 2022-02-19 NOTE — Evaluation (Signed)
Physical Therapy Evaluation  Patient Details Name: Tonya Ingram MRN: [id number] DOB: 07-31-1950 Today's Date: 02/19/2022  History of Present Illness  Pt is a 71 y/o female who prsents s/p L3-5 PLIF on 02/18/2022. PMH signitifant for L total hip replacement.  Clinical Impression  Pt admitted with above diagnosis. At the time of PT eval, pt was able to demonstrate transfers and ambulation with gross supervision for safety and no AD. Pt was educated on precautions, brace application/wearing schedule, appropriate activity progression, and car transfer. Pt currently with functional limitations due to the deficits listed below (see PT Problem List). Pt will benefit from skilled PT to increase their independence and safety with mobility to allow discharge to the venue listed below.         Recommendations for follow up therapy are one component of a multi-disciplinary discharge planning process, led by the attending physician.  Recommendations may be updated based on patient status, additional functional criteria and insurance authorization.  Follow Up Recommendations No PT follow up      Assistance Recommended at Discharge PRN  Patient can return home with the following  A little help with walking and/or transfers;A little help with bathing/dressing/bathroom;Assistance with cooking/housework;Assist for transportation;Help with stairs or ramp for entrance    Equipment Recommendations None recommended by PT  Recommendations for Other Services       Functional Status Assessment Patient has had a recent decline in their functional status and demonstrates the ability to make significant improvements in function in a reasonable and predictable amount of time.     Precautions / Restrictions Precautions Precautions: Back Precaution Booklet Issued: Yes (comment) Precaution Comments: All edeucation reviewed Required Braces or Orthoses: Spinal Brace Spinal Brace: Lumbar corset;Applied in sitting  position Restrictions Weight Bearing Restrictions: No      Mobility  Bed Mobility Overal bed mobility: Modified Independent             General bed mobility comments: HOB flat and rails lowered to simulate home environment.    Transfers Overall transfer level: Needs assistance Equipment used: None Transfers: Sit to/from Stand Sit to Stand: Supervision           General transfer comment: No assist required but close supervision provided for safety.    Ambulation/Gait Ambulation/Gait assistance: Min guard Gait Distance (Feet): 300 Feet Assistive device: None Gait Pattern/deviations: Step-through pattern, Decreased stride length, Trunk flexed, Narrow base of support Gait velocity: Decreased Gait velocity interpretation: <1.31 ft/sec, indicative of household ambulator   General Gait Details: Slow and guarded but without overt LOB. Pt with hands out in higher guard position, and with minimal hip/knee flexion (appears stiff). Did seem to become more comfortable as gait progressed but step/stride length remained short.  Stairs Stairs: Yes Stairs assistance: Min guard Stair Management: One rail Right, Step to pattern, Forwards Number of Stairs: 10 General stair comments: VCs for sequencing and general safety. Pt was able to complete a full flight of stairs without complaints of pain. Hands on guarding for safety but no assist provided.  Wheelchair Mobility    Modified Rankin (Stroke Patients Only)       Balance Overall balance assessment: Mild deficits observed, not formally tested (shuffled gait)                                           Pertinent Vitals/Pain Pain Assessment Pain Assessment: Faces Faces Pain  Scale: Hurts little more Pain Location: operative site Pain Descriptors / Indicators: Operative site guarding, Discomfort Pain Intervention(s): Limited activity within patient's tolerance, Monitored during session, Repositioned     Home Living Family/patient expects to be discharged to:: Private residence Living Arrangements: Alone Available Help at Discharge: Family Type of Home: Apartment Home Access: Stairs to enter Entrance Stairs-Rails: Can reach both Entrance Stairs-Number of Steps: 15   Home Layout: One level Home Equipment: Conservation officer, nature (2 wheels);Cane - single point Additional Comments: Plans to stay with sister who has a ramp to enter, tub shower, and stair lift to guest room    Prior Function Prior Level of Function : Independent/Modified Independent;Driving             Mobility Comments: Pt reports no AD and that her gait is shuffled at baseline ADLs Comments: reports independent in ADL, IADL, and driving     Hand Dominance   Dominant Hand: Right    Extremity/Trunk Assessment   Upper Extremity Assessment Upper Extremity Assessment: Overall WFL for tasks assessed    Lower Extremity Assessment Lower Extremity Assessment: Generalized weakness (Mild; consistent with pre-op diagnosis)    Cervical / Trunk Assessment Cervical / Trunk Assessment: Back Surgery  Communication   Communication: No difficulties  Cognition Arousal/Alertness: Awake/alert Behavior During Therapy: WFL for tasks assessed/performed Overall Cognitive Status: Within Functional Limits for tasks assessed                                          General Comments General comments (skin integrity, edema, etc.): VSS. Pt reporting she will not need a BSC if her sister has one. Updated nursing staff to ask upon sister arrival    Exercises     Assessment/Plan    PT Assessment Patient needs continued PT services  PT Problem List Decreased strength;Decreased activity tolerance;Decreased balance;Decreased mobility;Decreased knowledge of use of DME;Decreased safety awareness;Decreased knowledge of precautions;Pain       PT Treatment Interventions DME instruction;Gait training;Functional mobility  training;Therapeutic activities;Stair training;Therapeutic exercise;Balance training;Cognitive remediation;Patient/family education    PT Goals (Current goals can be found in the Care Plan section)  Acute Rehab PT Goals Patient Stated Goal: Be able to return to her apartment in a few days PT Goal Formulation: With patient Time For Goal Achievement: 02/26/22 Potential to Achieve Goals: Good    Frequency Min 5X/week     Co-evaluation               AM-PAC PT "6 Clicks" Mobility  Outcome Measure Help needed turning from your back to your side while in a flat bed without using bedrails?: None Help needed moving from lying on your back to sitting on the side of a flat bed without using bedrails?: None Help needed moving to and from a bed to a chair (including a wheelchair)?: A Little Help needed standing up from a chair using your arms (e.g., wheelchair or bedside chair)?: A Little Help needed to walk in hospital room?: A Little Help needed climbing 3-5 steps with a railing? : A Little 6 Click Score: 20    End of Session Equipment Utilized During Treatment: Gait belt;Back brace Activity Tolerance: Patient tolerated treatment well Patient left: in chair;with call bell/phone within reach Nurse Communication: Mobility status PT Visit Diagnosis: Unsteadiness on feet (R26.81);Pain Pain - part of body:  (back)    Time: 2637-8588 PT Time Calculation (min) (ACUTE  ONLY): 21 min   Charges:   PT Evaluation $PT Eval Low Complexity: [place], PT, DPT Acute Rehabilitation Services Secure Chat Preferred Office: [phone]   Thelma Comp 02/19/2022, 11:05 AM

## 2022-02-19 NOTE — Progress Notes (Signed)
Patient ID: Tonya Ingram, female   DOB: 1950-06-29, 71 y.o.   MRN: [id number] Vital signs stable and patient doing well. She has considerable soreness and would like to stay another day as she will be going to her sisters house where there are stairs to climb. Incision is clean dressing has been changed once.

## 2022-02-19 NOTE — Care Management CC44 (Signed)
Condition Code 44 Documentation Completed  Patient Details  Name: Alleene Stoy MRN: [id number] Date of Birth: [birth year]   Condition Code 44 given:  Yes Patient signature on Condition Code 44 notice:  Yes Documentation of 2 MD's agreement:  Yes Code 44 added to claim:  Yes    Ella Bodo, RN 02/19/2022, 9:58 AM

## 2022-02-19 NOTE — TOC Transition Note (Signed)
Transition of Care [facility]) - CM/SW Discharge Note   Patient Details  Name: Tonya Ingram MRN: [id number] Date of Birth: [birth year]  [facility] CM/SW Contact:  Ella Bodo, RN Phone Number: 02/19/2022, 11:40 AM   Clinical Narrative:    Pt is a 71 y/o female who prsents s/p L3-5 PLIF on 02/18/2022. PMH signitifant for L total hip replacement. PTA, pt independent and living at home alone; she states her sister will be available to provide needed assistance.  PT/OT recommending no OP follow up; 3C staff will provide recommended DME.    Final next level of care: Home/Self Care Barriers to Discharge: Barriers Resolved                         Discharge Plan and Services   Discharge Planning Services: CM Consult                                 Social Determinants of Health (SDOH) Interventions     Readmission Risk Interventions     No data to display         Reinaldo Raddle, RN, BSN  Trauma/Neuro ICU Case Manager [phone]

## 2022-02-19 NOTE — Care Management Obs Status (Signed)
[place] NOTIFICATION   Patient Details  Name: Tonya Ingram MRN: [id number] Date of Birth: 10-29-1950   Medicare Observation Status Notification Given:  Yes    Ella Bodo, RN 02/19/2022, 9:58 AM

## 2022-02-20 DIAGNOSIS — M4316 Spondylolisthesis, lumbar region: Secondary | ICD-10-CM | POA: Diagnosis not present

## 2022-02-20 MED ORDER — OXYCODONE-ACETAMINOPHEN 5-325 MG PO TABS: 1.0000 | ORAL_TABLET | ORAL | 0 refills | Status: DC | PRN

## 2022-02-20 MED ORDER — METHOCARBAMOL 500 MG PO TABS: 500.0000 mg | ORAL_TABLET | Freq: Four times a day (QID) | ORAL | 2 refills | Status: DC | PRN

## 2022-02-20 NOTE — Discharge Summary (Signed)
Physician Discharge Summary  Patient ID: Tonya Ingram MRN: [id number] DOB/AGE: [birth year] 70 y.o.  Admit date: 02/18/2022 Discharge date: 02/20/2022  Admission Diagnoses: Spondylolisthesis L3-4 L4-5 with lumbar stenosis neurogenic claudication.  Lumbar radiculopathy.  Discharge Diagnoses: Spondylolisthesis L3-4 L4-5.  Lumbar stenosis with neurogenic claudication.  Lumbar radiculopathy. Principal Problem:   Spondylolisthesis at L3-L4 level   Discharged Condition: good  Hospital Course: Patient was admitted to undergo surgical decompression arthrodesis from L3-L5.  She tolerated surgery well.  Consults: None  Significant Diagnostic Studies: None  Treatments: surgery: See op note  Discharge Exam: Blood pressure 117/60, pulse (!) 59, temperature 99.1 F (37.3 C), temperature source Oral, resp. rate 16, height '5\' 5"'$  (1.651 m), weight 54.4 kg, SpO2 93 %. Incision is clean and dry Station and gait are intact.  Disposition: Discharge disposition: 01-Home or Self Care       Discharge Instructions     Call MD for:  redness, tenderness, or signs of infection (pain, swelling, redness, odor or green/yellow discharge around incision site)   Complete by: As directed    Call MD for:  severe uncontrolled pain   Complete by: As directed    Call MD for:  temperature >100.4   Complete by: As directed    Diet - low sodium heart healthy   Complete by: As directed    Discharge wound care:   Complete by: As directed    Okay to shower. Do not apply salves or appointments to incision. No heavy lifting with the upper extremities greater than 10 pounds. May resume driving when not requiring pain medication and patient feels comfortable with doing so.   Incentive spirometry RT   Complete by: As directed    Increase activity slowly   Complete by: As directed       Allergies as of 02/20/2022       Reactions   Codeine Nausea And Vomiting        Medication List     TAKE these  medications    acetaminophen 650 MG CR tablet Commonly known as: TYLENOL Take 1,300 mg by mouth daily.   aspirin EC 81 MG tablet Take 81 mg by mouth daily. Swallow whole.   CALCIUM + VITAMIN D3 PO Take 1 tablet by mouth daily.   cetirizine 10 MG tablet Commonly known as: ZYRTEC Take 10 mg by mouth daily.   escitalopram 10 MG tablet Commonly known as: LEXAPRO Take 10 mg by mouth daily.   levothyroxine 50 MCG tablet Commonly known as: SYNTHROID Take 50 mcg by mouth daily before breakfast.   MAGNESIUM PO Take 400 mg by mouth daily.   meclizine 25 MG tablet Commonly known as: ANTIVERT Take 25 mg by mouth 3 (three) times daily as needed for dizziness (vertigo).   methocarbamol 500 MG tablet Commonly known as: ROBAXIN Take 1 tablet (500 mg total) by mouth every 6 (six) hours as needed for muscle spasms.   oxyCODONE-acetaminophen 5-325 MG tablet Commonly known as: PERCOCET/ROXICET Take 1-2 tablets by mouth every 4 (four) hours as needed for moderate pain or severe pain.   traMADol 50 MG tablet Commonly known as: ULTRAM Take 1 tablet (50 mg total) by mouth every 6 (six) hours as needed.   valACYclovir 500 MG tablet Commonly known as: VALTREX Take 500 mg by mouth daily.               Discharge Care Instructions  (From admission, onward)           Start  Ordered   02/20/22 0000  Discharge wound care:       Comments: Okay to shower. Do not apply salves or appointments to incision. No heavy lifting with the upper extremities greater than 10 pounds. May resume driving when not requiring pain medication and patient feels comfortable with doing so.   02/20/22 0856             Signed: Blanchie Dessert Tonya Ingram 02/20/2022, 8:56 AM

## 2022-02-20 NOTE — Progress Notes (Signed)
Physical Therapy Treatment Patient Details Name: Tonya Ingram MRN: [id number] DOB: 02-13-1951 Today's Date: 02/20/2022   History of Present Illness Pt is a 71 y/o female who prsents s/p L3-5 PLIF on 02/18/2022. PMH signitifant for L total hip replacement.    PT Comments    Pt progressing towards physical therapy goals. Was able to perform transfers and ambulation with gross min guard assist to supervision for safety and no AD. Pt vomited during gait training, but after reports feeling better and eager for discharge. Sister present and therapist reviewed education on precautions, brace application/wearing schedule, appropriate activity progression, and car transfer. Pt anticipates d/c home this morning.    Recommendations for follow up therapy are one component of a multi-disciplinary discharge planning process, led by the attending physician.  Recommendations may be updated based on patient status, additional functional criteria and insurance authorization.  Follow Up Recommendations  No PT follow up     Assistance Recommended at Discharge PRN  Patient can return home with the following A little help with walking and/or transfers;A little help with bathing/dressing/bathroom;Assistance with cooking/housework;Assist for transportation;Help with stairs or ramp for entrance   Equipment Recommendations  None recommended by PT    Recommendations for Other Services       Precautions / Restrictions Precautions Precautions: Back Precaution Booklet Issued: Yes (comment) Precaution Comments: All edeucation reviewed Required Braces or Orthoses: Spinal Brace Spinal Brace: Lumbar corset;Applied in sitting position Restrictions Weight Bearing Restrictions: No     Mobility  Bed Mobility Overal bed mobility: Modified Independent             General bed mobility comments: HOB flat and rails lowered to simulate home environment.    Transfers Overall transfer level: Needs  assistance Equipment used: None Transfers: Sit to/from Stand Sit to Stand: Supervision           General transfer comment: Slow and guarded due to pain - no assist required.    Ambulation/Gait Ambulation/Gait assistance: Min guard, Supervision Gait Distance (Feet): 200 Feet Assistive device: None Gait Pattern/deviations: Step-through pattern, Decreased stride length, Trunk flexed, Narrow base of support Gait velocity: Decreased Gait velocity interpretation: <1.31 ft/sec, indicative of household ambulator   General Gait Details: Slow and guarded but without overt LOB. Pt with hands out in higher guard position, and with minimal hip/knee flexion (appears stiff). Became nauseated during gait training and vomited in the hallway. Transport chair ride back to the room.   Stairs             Wheelchair Mobility    Modified Rankin (Stroke Patients Only)       Balance Overall balance assessment: Mild deficits observed, not formally tested                                          Cognition Arousal/Alertness: Awake/alert Behavior During Therapy: WFL for tasks assessed/performed Overall Cognitive Status: Within Functional Limits for tasks assessed                                          Exercises      General Comments        Pertinent Vitals/Pain Pain Assessment Pain Assessment: Faces Faces Pain Scale: Hurts little more Pain Location: operative site Pain Descriptors / Indicators: Operative site guarding,  Discomfort Pain Intervention(s): Limited activity within patient's tolerance, Monitored during session, Repositioned    Home Living                          Prior Function            PT Goals (current goals can now be found in the care plan section) Acute Rehab PT Goals Patient Stated Goal: Be able to return to her apartment in a few days PT Goal Formulation: With patient/family Time For Goal Achievement:  02/26/22 Potential to Achieve Goals: Good Progress towards PT goals: Progressing toward goals    Frequency    Min 5X/week      PT Plan Current plan remains appropriate    Co-evaluation              AM-PAC PT "6 Clicks" Mobility   Outcome Measure  Help needed turning from your back to your side while in a flat bed without using bedrails?: None Help needed moving from lying on your back to sitting on the side of a flat bed without using bedrails?: None Help needed moving to and from a bed to a chair (including a wheelchair)?: A Little Help needed standing up from a chair using your arms (e.g., wheelchair or bedside chair)?: A Little Help needed to walk in hospital room?: A Little Help needed climbing 3-5 steps with a railing? : A Little 6 Click Score: 20    End of Session Equipment Utilized During Treatment: Gait belt;Back brace Activity Tolerance: Patient tolerated treatment well Patient left: in chair;with call bell/phone within reach;with family/visitor present Nurse Communication: Mobility status PT Visit Diagnosis: Unsteadiness on feet (R26.81);Pain Pain - part of body:  (back)     Time: 2751-7001 PT Time Calculation (min) (ACUTE ONLY): 13 min  Charges:  $Gait Training: 8-22 mins                     Rolinda Roan, PT, DPT Acute Rehabilitation Services Secure Chat Preferred Office: [phone]    Tonya Ingram 02/20/2022, 12:15 PM

## 2022-02-20 NOTE — Plan of Care (Signed)
Pt doing well. Pt given D/C instructions with verbal understanding. Rx's were snet to the pharmacy by MD. Pt's incision is clean and dry with no sign of infection. Pt's IV was removed prior to D/C. Pt D/C'd home via wheelchair per MD order. Pt is stable @ D/C and has no other needs at this time. Holli Humbles, RN

## 2022-03-06 MED FILL — Sodium Chloride IV Soln 0.9%: INTRAVENOUS | Qty: 2000 | Status: AC

## 2022-03-06 MED FILL — Heparin Sodium (Porcine) Inj 1000 Unit/ML: INTRAMUSCULAR | Qty: 30 | Status: AC

## 2022-03-13 DIAGNOSIS — M4316 Spondylolisthesis, lumbar region: Secondary | ICD-10-CM | POA: Diagnosis not present

## 2022-04-10 DIAGNOSIS — E039 Hypothyroidism, unspecified: Secondary | ICD-10-CM | POA: Diagnosis not present

## 2022-04-10 DIAGNOSIS — L989 Disorder of the skin and subcutaneous tissue, unspecified: Secondary | ICD-10-CM | POA: Diagnosis not present

## 2022-04-10 DIAGNOSIS — E782 Mixed hyperlipidemia: Secondary | ICD-10-CM | POA: Diagnosis not present

## 2022-04-10 DIAGNOSIS — Z789 Other specified health status: Secondary | ICD-10-CM | POA: Diagnosis not present

## 2022-04-10 DIAGNOSIS — R011 Cardiac murmur, unspecified: Secondary | ICD-10-CM | POA: Diagnosis not present

## 2022-04-10 DIAGNOSIS — F339 Major depressive disorder, recurrent, unspecified: Secondary | ICD-10-CM | POA: Diagnosis not present

## 2022-04-10 DIAGNOSIS — Z0000 Encounter for general adult medical examination without abnormal findings: Secondary | ICD-10-CM | POA: Diagnosis not present

## 2022-04-10 DIAGNOSIS — Z1211 Encounter for screening for malignant neoplasm of colon: Secondary | ICD-10-CM | POA: Diagnosis not present

## 2022-04-10 DIAGNOSIS — Z638 Other specified problems related to primary support group: Secondary | ICD-10-CM | POA: Diagnosis not present

## 2022-04-17 DIAGNOSIS — Z1211 Encounter for screening for malignant neoplasm of colon: Secondary | ICD-10-CM | POA: Diagnosis not present

## 2022-04-23 ENCOUNTER — Telehealth: Payer: Self-pay | Admitting: Internal Medicine

## 2022-04-23 NOTE — Telephone Encounter (Signed)
Received call transferred from operator and spoke with Danae Chen at [facility].  Patient saw Dr Mannie Stabile today.  Dr Mannie Stabile reviewed Dr Oralia Rud office note and echo results and would like to know plan for patient.  Call back number for Danae Chen is [phone].  OK to leave detailed message if call goes to voicemail

## 2022-04-23 NOTE — Telephone Encounter (Signed)
Follow Up:     Dr Roma Kayser Assistant , Danae Chen is calling.

## 2022-04-23 NOTE — Telephone Encounter (Signed)
Please clarify:  Patient was seen for mild to moderate aortic stenosis.  See result note; planned for f/u in one year.  She was deemed to be low surgical risk and proceed to have two surgeries with no cardiac complications.  She has HLD.  Her LDL was above 70.  We need to start therapy for her LDL, we had waited until after surgeries as she was having myalgias off therapy.   Thanks,  MAC   Message from Dr Gasper Sells left on [facility].  Echo result note left also.  Left message to call if questions.

## 2022-04-24 DIAGNOSIS — M4316 Spondylolisthesis, lumbar region: Secondary | ICD-10-CM | POA: Diagnosis not present

## 2022-04-30 ENCOUNTER — Other Ambulatory Visit: Payer: Self-pay | Admitting: Family Medicine

## 2022-04-30 DIAGNOSIS — R911 Solitary pulmonary nodule: Secondary | ICD-10-CM

## 2022-06-10 ENCOUNTER — Ambulatory Visit
Admission: RE | Admit: 2022-06-10 | Discharge: 2022-06-10 | Disposition: A | Discharge status: Home / Self Care | Payer: No Typology Code available for payment source | Source: Ambulatory Visit | Attending: Family Medicine | Admitting: Family Medicine

## 2022-06-10 DIAGNOSIS — R911 Solitary pulmonary nodule: Secondary | ICD-10-CM

## 2022-07-16 ENCOUNTER — Ambulatory Visit: Payer: Medicare HMO | Admitting: Sports Medicine

## 2022-07-16 VITALS — BP 124/82 | Ht 65.0 in | Wt 120.0 lb

## 2022-07-16 DIAGNOSIS — M25551 Pain in right hip: Secondary | ICD-10-CM

## 2022-07-16 MED ORDER — MELOXICAM 15 MG PO TABS: ORAL_TABLET | ORAL | 0 refills | Status: DC

## 2022-07-16 NOTE — Assessment & Plan Note (Signed)
Right lateral hip pain worst with going up and down stairs. Physical exam findings notable for tenderness to palpation of gluteus medius, decrease strength with hip abduction, and positive Trendelenburg on right side. 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 Will start a 5 day course of scheduled Mobic 15 mg daily, then as needed. Given hip abduction strengthening exercises to work on at home and with PT. Continue to use CBD balm. Follow-up as needed.

## 2022-07-16 NOTE — Progress Notes (Cosign Needed)
   Established Patient Office Visit  Subjective   Patient ID: Tonya Ingram, female    DOB: [birth year]  Age: 72 y.o. MRN: [id number]  CC: Right hip pain   HPI: Tonya Ingram is a 72 year old female with a history of severe lumbar spinal stenosis s/p surgical decompression and arthrodesis from L3-L5 in 01/2022 who presents with right lateral hip pain.  She was last seen in April 2023 for right hip pain and diagnosed with trochanteric bursitis. She received an US-guided greater trochanteric steroid injection at the time but is unsure if it helped. She was dealing with significant back issues at the time and feels like her hip pain was masked by her back discomfort. Her hip pain is worst when going up and down stairs or when laying on the affected hip at night. She has been working with PT for 1x/week for about 1.5 months to improve her leg strength. Her PT felt like her IT band was tight and told her to come her to rule out bursitis.    Objective:     BP 124/82   Ht '5\' 5"'$  (1.651 m)   Wt 120 lb (54.4 kg)   BMI 19.97 kg/m  Vital signs reviewed.   Physical Exam  Right hip: No swelling. Tender to palpation of lateral hip, particularly at gluteus medius. Full ROM. Strength 5/5 with flexion, 3/5 with abduction. Negate FAbER. Positive Trendelenburg test.    Assessment & Plan:   Problem List Items Addressed This Visit       Other   Right hip pain - Primary    Right lateral hip pain worst with going up and down stairs. Physical exam findings notable for tenderness to palpation of gluteus medius, decrease strength with hip abduction, and positive Trendelenburg on right side. 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 Will start a 5 day course of scheduled Mobic 15 mg daily, then as needed. Given hip abduction strengthening exercises to work on at home and with PT. Continue to use CBD balm. Follow-up as needed.       Sabino Dick, MS4 [facility]

## 2022-07-31 NOTE — Progress Notes (Unsigned)
Cardiology Office Note:    Date:  08/01/2022   ID:  Tonya Ingram, DOB Apr 15, 1951, MRN [id number]  PCP:  Caren Macadam, MD   [facility] Providers Cardiologist:  Werner Lean, MD     Referring MD: Caren Macadam, MD   Chief Complaint: follow-up aortic stenosis   History of Present Illness:    Tonya Ingram is a very pleasant 72 y.o. female with a hx of aortic stenosis, mitral regurgitation, Raynaud's disease, hypothyroidism, aortic atherosclerosis and coronary artery calcifications on CT  She established with cardiology and was seen by Dr. Glenford Bayley on 01/25/2022 at which time she reported leg and back pain but otherwise feeling well.  She was scheduled to undergo back surgery 02/18/2022.  She had a partial finger amputation 12/2021. Underwent 2D echo for evaluation of heart murmur which revealed normal LVEF 60 to 65%, mild LVH, G1 DD, mild to moderate aortic stenosis with mean gradient 17.2 mmHg, mild MR. She was cleared for back surgery and advised to return in 6 months for follow-up.  Today, she is here for follow-up and reports she is doing well. Had major surgery of lumbar spine 02/18/2022 and is recovering well.  Is now walking 3 1/2 miles, 20 or so minutes on elliptical, lifting weights and doing spin classes. Has not returned to running but hopes to do so soon. Was started on rosuvastatin 5 mg daily by PCP, will have recheck of lipids next month. She denies chest pain, shortness of breath, lower extremity edema, fatigue, palpitations, melena, hematuria, hemoptysis, diaphoresis, weakness, presyncope, syncope, orthopnea, and PND.   Past Medical History:  Diagnosis Date   Arthritis    Depression    GERD (gastroesophageal reflux disease)    Heart murmur    never has caused problems   HSV infection    valtrex   Hypothyroidism    Raynaud's disease     Past Surgical History:  Procedure Laterality Date   AMPUTATION Right 01/17/2022   Procedure: Right index  finger partial amputation;  Surgeon: Orene Desanctis, MD;  Location: [facility];  Service: Orthopedics;  Laterality: Right;  with local anesthesia   THYROIDECTOMY     TOTAL HIP ARTHROPLASTY Left 06/30/2020   Procedure: LEFT TOTAL HIP ARTHROPLASTY ANTERIOR APPROACH;  Surgeon: Mcarthur Rossetti, MD;  Location: [facility];  Service: Orthopedics;  Laterality: Left;   TUBAL LIGATION     WISDOM TOOTH EXTRACTION      Current Medications: Current Meds  Medication Sig   acetaminophen (TYLENOL) 650 MG CR tablet Take 1,300 mg by mouth daily.   Calcium Carb-Cholecalciferol (CALCIUM + VITAMIN D3 PO) Take 1 tablet by mouth daily.   cetirizine (ZYRTEC) 10 MG tablet Take 10 mg by mouth daily.   escitalopram (LEXAPRO) 10 MG tablet Take 10 mg by mouth daily.   levothyroxine (SYNTHROID) 50 MCG tablet Take 50 mcg by mouth daily before breakfast.   MAGNESIUM PO Take 400 mg by mouth daily.   meclizine (ANTIVERT) 25 MG tablet Take 25 mg by mouth 3 (three) times daily as needed for dizziness (vertigo).   meloxicam (MOBIC) 15 MG tablet Take 1 tablet daily with food for 5 days. Then take as needed.   rosuvastatin (CRESTOR) 5 MG tablet Take 5 mg by mouth daily.   valACYclovir (VALTREX) 500 MG tablet Take 500 mg by mouth daily.     Allergies:   Codeine   Social History   Socioeconomic History   Marital status: Divorced    Spouse name: Not on  file   Number of children: Not on file   Years of education: Not on file   Highest education level: Not on file  Occupational History   Not on file  Tobacco Use   Smoking status: Never   Smokeless tobacco: Never  Vaping Use   Vaping Use: Never used  Substance and Sexual Activity   Alcohol use: Never   Drug use: Never   Sexual activity: Yes    Birth control/protection: Surgical    Comment: tubal ligation  Other Topics Concern   Not on file  Social History Narrative   Not on file   Social Determinants of Health   Financial Resource Strain: Not on file  Food  Insecurity: Not on file  Transportation Needs: Not on file  Physical Activity: Not on file  Stress: Not on file  Social Connections: Not on file     Family History: The patient's family history includes Arthritis in her mother; Hypertension in her father; Parkinson's disease in her mother; Prostate cancer in her father.  ROS:   Please see the history of present illness.   All other systems reviewed and are negative.  Labs/Other Studies Reviewed:    The following studies were reviewed today:  Echo 01/30/22 1. Left ventricular ejection fraction, by estimation, is 60 to 65%. The  left ventricle has normal function. The left ventricle has no regional  wall motion abnormalities. There is mild concentric left ventricular  hypertrophy. Left ventricular diastolic  parameters are consistent with Grade I diastolic dysfunction (impaired  relaxation).   2. Right ventricular systolic function is normal. The right ventricular  size is normal.   3. Left atrial size was mild to moderately dilated.   4. Right atrial size was moderately dilated.   5. The mitral valve is normal in structure. Mild mitral valve  regurgitation. No evidence of mitral stenosis.   6. The aortic valve is tricuspid. There is moderate calcification of the  aortic valve. Aortic valve regurgitation is not visualized. Mild to  moderate aortic valve stenosis. Aortic valve area, by VTI measures 1.13  cm. Aortic valve mean gradient measures   17.2 mmHg. Aortic valve Vmax measures 2.75 m/s.   7. The inferior vena cava is normal in size with greater than 50%  respiratory variability, suggesting right atrial pressure of 3 mmHg.  Recent Labs: 01/17/2022: ALT 17 02/19/2022: BUN 13; Creatinine, Ser 0.76; Hemoglobin 10.0; Platelets 212; Potassium 3.9; Sodium 139  Recent Lipid Panel No results found for: "CHOL", "TRIG", "HDL", "CHOLHDL", "VLDL", "LDLCALC", "LDLDIRECT"   Risk Assessment/Calculations:           Physical Exam:     VS:  BP 116/70   Pulse (!) 55   Ht '5\' 5"'$  (1.651 m)   Wt 127 lb 12.8 oz (58 kg)   SpO2 97%   BMI 21.27 kg/m     Wt Readings from Last 3 Encounters:  08/01/22 127 lb 12.8 oz (58 kg)  07/16/22 120 lb (54.4 kg)  02/18/22 120 lb (54.4 kg)     GEN:  Well nourished, well developed in no acute distress HEENT: Normal NECK: No JVD; No carotid bruits CARDIAC: RRR, 3/6 holosystolic murmur. No rubs, gallops RESPIRATORY:  Clear to auscultation without rales, wheezing or rhonchi  ABDOMEN: Soft, non-tender, non-distended MUSCULOSKELETAL:  No edema; No deformity. 2+ pedal pulses, equal bilaterally SKIN: Warm and dry NEUROLOGIC:  Alert and oriented x 3 PSYCHIATRIC:  Normal affect   EKG:  EKG is not ordered today.  Diagnoses:    1. Nonrheumatic aortic valve stenosis   2. Aortic atherosclerosis ([place])   3. Coronary artery calcification   4. Hyperlipidemia LDL goal <70   5. Nonrheumatic mitral valve regurgitation    Assessment and Plan:     Aortic stenosis: Mild to moderate AS on echo 01/2022 with mean gradient 17.2 mmHg. She is completely asymptomatic.  Encouraged her to notify us if she has any concerning symptoms prior to next office visit. We will repeat echo 01/2023.   Mitral regurgitation:  Mild mitral regurgitation on echo 01/30/22. She is asymptomatic. Will get echo 01/2023 for follow-up of aortic and mitral valve disease.   Hyperlipidemia LDL goal < 70: LDL 117 on 04/10/22. Started rosuvastatin 5 mg approximately 2-3 mo ago. Has appointment with PCP for follow-up.  Emphasized the importance of well-controlled LDL in the setting of coronary and aortic atherosclerosis.  CAC/Aortic atherosclerosis: Reviewed guidelines for secondary prevention of atherosclerotic heart disease. She is very active and eats a healthy diet. BP is well controlled. She was recently started on rosuvastatin by PCP, will closely monitor lipids.      Disposition: 6 months with Dr. Gasper Sells with echo  prior  Medication Adjustments/Labs and Tests Ordered: Current medicines are reviewed at length with the patient today.  Concerns regarding medicines are outlined above.  Orders Placed This Encounter  Procedures   ECHOCARDIOGRAM COMPLETE   No orders of the defined types were placed in this encounter.   Patient Instructions  Medication Instructions:   Your physician recommends that you continue on your current medications as directed. Please refer to the Current Medication list given to you today.   *If you need a refill on your cardiac medications before your next appointment, please call your pharmacy*   Lab Work:  None ordered.  If you have labs (blood work) drawn today and your tests are completely normal, you will receive your results only by: [place] (if you have MyChart) OR A paper copy in the mail If you have any lab test that is abnormal or we need to change your treatment, we will call you to review the results.   Testing/Procedures:  None ordered.   Follow-Up: At [facility], you and your health needs are our priority.  As part of our continuing mission to provide you with exceptional heart care, we have created designated Provider Care Teams.  These Care Teams include your primary Cardiologist (physician) and Advanced Practice Providers (APPs -  Physician Assistants and Nurse Practitioners) who all work together to provide you with the care you need, when you need it.  We recommend signing up for the patient portal called "MyChart".  Sign up information is provided on this After Visit Summary.  MyChart is used to connect with patients for Virtual Visits (Telemedicine).  Patients are able to view lab/test results, encounter notes, upcoming appointments, etc.  Non-urgent messages can be sent to your provider as well.   To learn more about what you can do with MyChart, go to [email or web address].    Your next appointment:   6 month(s)  Provider:    Werner Lean, MD        Signed, Emmaline Life, NP  08/01/2022 1:31 PM    [place]

## 2022-08-01 ENCOUNTER — Ambulatory Visit: Payer: Medicare HMO | Attending: Nurse Practitioner | Admitting: Nurse Practitioner

## 2022-08-01 ENCOUNTER — Encounter: Payer: Self-pay | Admitting: Nurse Practitioner

## 2022-08-01 VITALS — BP 116/70 | HR 55 | Ht 65.0 in | Wt 127.8 lb

## 2022-08-01 DIAGNOSIS — E785 Hyperlipidemia, unspecified: Secondary | ICD-10-CM | POA: Diagnosis not present

## 2022-08-01 DIAGNOSIS — I350 Nonrheumatic aortic (valve) stenosis: Secondary | ICD-10-CM | POA: Diagnosis not present

## 2022-08-01 DIAGNOSIS — I700 Atherosclerosis of aorta: Secondary | ICD-10-CM

## 2022-08-01 DIAGNOSIS — I2584 Coronary atherosclerosis due to calcified coronary lesion: Secondary | ICD-10-CM | POA: Diagnosis not present

## 2022-08-01 DIAGNOSIS — I340 Nonrheumatic mitral (valve) insufficiency: Secondary | ICD-10-CM | POA: Diagnosis not present

## 2022-08-01 DIAGNOSIS — I2510 Atherosclerotic heart disease of native coronary artery without angina pectoris: Secondary | ICD-10-CM

## 2022-08-01 NOTE — Patient Instructions (Signed)
Medication Instructions:   Your physician recommends that you continue on your current medications as directed. Please refer to the Current Medication list given to you today.   *If you need a refill on your cardiac medications before your next appointment, please call your pharmacy*   Lab Work:  None ordered.  If you have labs (blood work) drawn today and your tests are completely normal, you will receive your results only by: [place] (if you have MyChart) OR A paper copy in the mail If you have any lab test that is abnormal or we need to change your treatment, we will call you to review the results.   Testing/Procedures:  None ordered.   Follow-Up: At [facility], you and your health needs are our priority.  As part of our continuing mission to provide you with exceptional heart care, we have created designated Provider Care Teams.  These Care Teams include your primary Cardiologist (physician) and Advanced Practice Providers (APPs -  Physician Assistants and Nurse Practitioners) who all work together to provide you with the care you need, when you need it.  We recommend signing up for the patient portal called "MyChart".  Sign up information is provided on this After Visit Summary.  MyChart is used to connect with patients for Virtual Visits (Telemedicine).  Patients are able to view lab/test results, encounter notes, upcoming appointments, etc.  Non-urgent messages can be sent to your provider as well.   To learn more about what you can do with MyChart, go to [email or web address].    Your next appointment:   6 month(s)  Provider:   Werner Lean, MD

## 2022-08-16 ENCOUNTER — Ambulatory Visit
Admission: EM | Admit: 2022-08-16 | Discharge: 2022-08-16 | Disposition: A | Discharge status: Home / Self Care | Payer: Medicare HMO | Attending: Internal Medicine | Admitting: Internal Medicine

## 2022-08-16 DIAGNOSIS — M4316 Spondylolisthesis, lumbar region: Secondary | ICD-10-CM | POA: Diagnosis not present

## 2022-08-16 DIAGNOSIS — J069 Acute upper respiratory infection, unspecified: Secondary | ICD-10-CM

## 2022-08-16 DIAGNOSIS — J029 Acute pharyngitis, unspecified: Secondary | ICD-10-CM

## 2022-08-16 LAB — POCT RAPID STREP A (OFFICE): Rapid Strep A Screen: NEGATIVE

## 2022-08-16 NOTE — ED Triage Notes (Signed)
Pt states has had a cough for 2 days and now a sore throat. Hx of strep. Took tylenol around 4:30 this am.

## 2022-08-16 NOTE — Discharge Instructions (Addendum)
Advised to continue to take Tylenol or ibuprofen as needed for pain relief from the sore throat. Advised to take OTC cough preparations as needed to control cough and drainage.  Advised follow-up PCP or return to urgent care if symptoms fail to improve.

## 2022-08-16 NOTE — ED Provider Notes (Signed)
[facility]    CSN: [id number] Arrival date & time: 08/16/22  0818      History   Chief Complaint Chief Complaint  Patient presents with   Sore Throat    HPI Tonya Ingram is a 72 y.o. female.   72 year old female presents with congestion and sore throat.  Patient indicates for the past 2 days she has been having some upper respiratory congestion, rhinitis, postnasal drip mainly clear production.  She indicates she has had sore throat with progressive pain with swallowing.  She indicates she has had some mild cough and congestion.  She has been taking Tylenol with mild relief of symptoms.  She is without fever.  She is not having any wheezing or shortness of breath.  She is concerned about possibly having strep throat.   Sore Throat    Past Medical History:  Diagnosis Date   Arthritis    Depression    GERD (gastroesophageal reflux disease)    Heart murmur    never has caused problems   HSV infection    valtrex   Hypothyroidism    Raynaud's disease     Patient Active Problem List   Diagnosis Date Noted   Right hip pain 07/16/2022   Spondylolisthesis at L3-L4 level 02/18/2022   Heart murmur, systolic [id number]   Coronary artery calcification 01/25/2022   Aortic atherosclerosis ([place]) 01/25/2022   Mixed hyperlipidemia 01/25/2022   Preop cardiovascular exam 01/25/2022   Ischemia of finger 12/28/2021   Hypothyroidism 12/28/2021   Depression 12/28/2021   Status post total replacement of left hip 06/30/2020   Unilateral primary osteoarthritis, left hip 04/03/2020    Past Surgical History:  Procedure Laterality Date   AMPUTATION Right 01/17/2022   Procedure: Right index finger partial amputation;  Surgeon: Orene Desanctis, MD;  Location: [facility];  Service: Orthopedics;  Laterality: Right;  with local anesthesia   THYROIDECTOMY     TOTAL HIP ARTHROPLASTY Left 06/30/2020   Procedure: LEFT TOTAL HIP ARTHROPLASTY ANTERIOR APPROACH;  Surgeon: Mcarthur Rossetti, MD;  Location: [facility];  Service: Orthopedics;  Laterality: Left;   TUBAL LIGATION     WISDOM TOOTH EXTRACTION      OB History   No obstetric history on file.      Home Medications    Prior to Admission medications   Medication Sig Start Date End Date Taking? Authorizing Provider  acetaminophen (TYLENOL) 650 MG CR tablet Take 1,300 mg by mouth daily.    [provider]  Calcium Carb-Cholecalciferol (CALCIUM + VITAMIN D3 PO) Take 1 tablet by mouth daily.    [provider]  cetirizine (ZYRTEC) 10 MG tablet Take 10 mg by mouth daily.    [provider]  escitalopram (LEXAPRO) 10 MG tablet Take 10 mg by mouth daily.    [provider]  levothyroxine (SYNTHROID) 50 MCG tablet Take 50 mcg by mouth daily before breakfast.    [provider]  MAGNESIUM PO Take 400 mg by mouth daily.    [provider]  meclizine (ANTIVERT) 25 MG tablet Take 25 mg by mouth 3 (three) times daily as needed for dizziness (vertigo).    [provider]  meloxicam (MOBIC) 15 MG tablet Take 1 tablet daily with food for 5 days. Then take as needed. 07/16/22   Thurman Coyer, DO  rosuvastatin (CRESTOR) 5 MG tablet Take 5 mg by mouth daily. 04/25/22   [provider]  valACYclovir (VALTREX) 500 MG tablet Take 500 mg by  mouth daily. 12/01/21   [provider]    Family History Family History  Problem Relation Age of Onset   Parkinson's disease Mother    Arthritis Mother    Prostate cancer Father    Hypertension Father     Social History Social History   Tobacco Use   Smoking status: Never   Smokeless tobacco: Never  Vaping Use   Vaping Use: Never used  Substance Use Topics   Alcohol use: Never   Drug use: Never     Allergies   Codeine   Review of Systems Review of Systems  HENT:  Positive for postnasal drip, rhinorrhea and sore throat.      Physical Exam Triage Vital Signs ED Triage Vitals  Enc  Vitals Group     BP 08/16/22 0827 (!) 149/79     Pulse Rate 08/16/22 0827 66     Resp 08/16/22 0827 20     Temp 08/16/22 0827 97.6 F (36.4 C)     Temp Source 08/16/22 0827 Oral     SpO2 08/16/22 0827 99 %     Weight --      Height --      Head Circumference --      Peak Flow --      Pain Score 08/16/22 0835 4     Pain Loc --      Pain Edu? --      Excl. in [place]? --    No data found.  Updated Vital Signs BP (!) 149/79 (BP Location: Left Arm)   Pulse 66   Temp 97.6 F (36.4 C) (Oral)   Resp 20   SpO2 99%   Visual Acuity Right Eye Distance:   Left Eye Distance:   Bilateral Distance:    Right Eye Near:   Left Eye Near:    Bilateral Near:     Physical Exam Constitutional:      Appearance: She is well-developed.  HENT:     Right Ear: Tympanic membrane and ear canal normal.     Left Ear: Tympanic membrane and ear canal normal.     Mouth/Throat:     Mouth: Mucous membranes are moist.     Pharynx: Oropharynx is clear. No oropharyngeal exudate or posterior oropharyngeal erythema.  Cardiovascular:     Rate and Rhythm: Normal rate and regular rhythm.     Heart sounds: Normal heart sounds.  Pulmonary:     Effort: Pulmonary effort is normal.     Breath sounds: Normal breath sounds and air entry. No wheezing, rhonchi or rales.  Lymphadenopathy:     Cervical: No cervical adenopathy.  Neurological:     Mental Status: She is alert.      UC Treatments / Results  Labs (all labs ordered are listed, but only abnormal results are displayed) Labs Reviewed  CULTURE, GROUP A STREP [facility])  POCT RAPID STREP A (OFFICE)    EKG   Radiology No results found.  Procedures Procedures (including critical care time)  Medications Ordered in UC Medications - No data to display  Initial Impression / Assessment and Plan / UC Course  I have reviewed the triage vital signs and the nursing notes.  Pertinent labs & imaging results that were available during my care of the patient  were reviewed by me and considered in my medical decision making (see chart for details).    Plan: The diagnosis will be treated with the following: 1.  Upper respiratory tract infection: A.  Advised  take Tylenol or Motrin as needed for discomfort. B.  Advised to use OTC cough preparation as needed. 2.  Sore throat: A.  Advised to take Tylenol or Motrin as needed for discomfort. B.  Throat culture is pending. 3.  Advised follow-up PCP or return to urgent care as needed. Final Clinical Impressions(s) / UC Diagnoses   Final diagnoses:  Acute upper respiratory infection  Sore throat     Discharge Instructions      Advised to continue to take Tylenol or ibuprofen as needed for pain relief from the sore throat. Advised to take OTC cough preparations as needed to control cough and drainage.  Advised follow-up PCP or return to urgent care if symptoms fail to improve.    ED Prescriptions   None    PDMP not reviewed this encounter.   Nyoka Lint, PA-C 08/16/22 [phone]

## 2022-08-18 LAB — CULTURE, GROUP A STREP (THRC)

## 2022-08-20 ENCOUNTER — Encounter: Payer: Self-pay | Admitting: Internal Medicine

## 2022-08-23 ENCOUNTER — Other Ambulatory Visit: Payer: Self-pay | Admitting: Sports Medicine

## 2022-08-28 ENCOUNTER — Telehealth: Payer: Self-pay

## 2022-08-28 MED ORDER — MELOXICAM 15 MG PO TABS: 15.0000 mg | ORAL_TABLET | Freq: Every day | ORAL | 1 refills | Status: AC | PRN

## 2022-08-28 NOTE — Telephone Encounter (Signed)
Refill sent to pharmacy.   

## 2022-09-26 ENCOUNTER — Ambulatory Visit (INDEPENDENT_AMBULATORY_CARE_PROVIDER_SITE_OTHER): Payer: Medicare HMO | Admitting: Family Medicine

## 2022-09-26 VITALS — BP 124/82 | Ht 65.0 in | Wt 125.0 lb

## 2022-09-26 DIAGNOSIS — M25551 Pain in right hip: Secondary | ICD-10-CM

## 2022-09-26 MED ORDER — METHYLPREDNISOLONE ACETATE 40 MG/ML IJ SUSP: 80.0000 mg | Freq: Once | INTRAMUSCULAR | Status: DC

## 2022-09-26 MED ORDER — METHYLPREDNISOLONE ACETATE 80 MG/ML IJ SUSP: 80.0000 mg | Freq: Once | INTRAMUSCULAR | Status: DC

## 2022-09-26 MED ORDER — NITROGLYCERIN 0.2 MG/HR TD PT24: MEDICATED_PATCH | TRANSDERMAL | 1 refills | Status: DC

## 2022-09-26 MED ORDER — METHYLPREDNISOLONE ACETATE 80 MG/ML IJ SUSP
80.0000 mg | Freq: Once | INTRAMUSCULAR | Status: AC
  Administered 2022-09-26: 80 mg via INTRA_ARTICULAR

## 2022-09-26 NOTE — Patient Instructions (Signed)
You were given a trochanteric bursa injection today. Try nitro patches just behind this area for gluteal tendinopathy as well - 1/4th patch to affected area, change daily. Follow up with [place] in about 5-6 weeks for reevaluation.

## 2022-09-27 ENCOUNTER — Encounter: Payer: Self-pay | Admitting: Family Medicine

## 2022-09-27 NOTE — Progress Notes (Signed)
PCP: Aliene BeamsHagler, Rachel, MD  Subjective:   HPI: Patient is a 72 y.o. female here for right hip pain.  1/23: She was last seen in April 2023 for right hip pain and diagnosed with trochanteric bursitis. She received an US-guided greater trochanteric steroid injection at the time but is unsure if it helped. She was dealing with significant back issues at the time and feels like her hip pain was masked by her back discomfort. Her hip pain is worst when going up and down stairs or when laying on the affected hip at night. She has been working with PT for 1x/week for about 1.5 months to improve her leg strength. Her PT felt like her IT band was tight and told her to come her to rule out bursitis.  She denies any groin pain.  She is status post total left hip arthroplasty done by Dr. Magnus IvanBlackman in 2022.  She has done well postoperatively with this.  4/4: Patient reports she continues to have lateral right hip pain. Doing home exercises and stretches, physical therapy. Pain worse lying on right side, unable to lie down on this side due to pain. Prior injection maybe helped a little for trochanteric bursitis. Pain also just posterior to this area as well.  Past Medical History:  Diagnosis Date   Arthritis    Depression    GERD (gastroesophageal reflux disease)    Heart murmur    never has caused problems   HSV infection    valtrex   Hypothyroidism    Raynaud's disease     Current Outpatient Medications on File Prior to Visit  Medication Sig Dispense Refill   acetaminophen (TYLENOL) 650 MG CR tablet Take 1,300 mg by mouth daily.     Calcium Carb-Cholecalciferol (CALCIUM + VITAMIN D3 PO) Take 1 tablet by mouth daily.     cetirizine (ZYRTEC) 10 MG tablet Take 10 mg by mouth daily.     escitalopram (LEXAPRO) 10 MG tablet Take 10 mg by mouth daily.     levothyroxine (SYNTHROID) 50 MCG tablet Take 50 mcg by mouth daily before breakfast.     MAGNESIUM PO Take 400 mg by mouth daily.     meclizine  (ANTIVERT) 25 MG tablet Take 25 mg by mouth 3 (three) times daily as needed for dizziness (vertigo).     meloxicam (MOBIC) 15 MG tablet Take 1 tablet (15 mg total) by mouth daily as needed for pain. Take with food. 30 tablet 1   rosuvastatin (CRESTOR) 5 MG tablet Take 5 mg by mouth daily.     valACYclovir (VALTREX) 500 MG tablet Take 500 mg by mouth daily.     No current facility-administered medications on file prior to visit.    Past Surgical History:  Procedure Laterality Date   AMPUTATION Right 01/17/2022   Procedure: Right index finger partial amputation;  Surgeon: Gomez CleverlySpears, James, MD;  [facility];  Service: Orthopedics;  Laterality: Right;  with local anesthesia   THYROIDECTOMY     TOTAL HIP ARTHROPLASTY Left 06/30/2020   Procedure: LEFT TOTAL HIP ARTHROPLASTY ANTERIOR APPROACH;  Surgeon: Kathryne HitchBlackman, Christopher Y, MD;  Location: [facility];  Service: Orthopedics;  Laterality: Left;   TUBAL LIGATION     WISDOM TOOTH EXTRACTION      Allergies  Allergen Reactions   Codeine Nausea And Vomiting    BP 124/82   Ht 5\' 5"  (1.651 m)   Wt 125 lb (56.7 kg)   BMI 20.80 kg/m      06/28/2021  8:51 AM 09/18/2021    3:01 PM  [facility]  Frequency of aerobic exercise (# of days/week) 7 6  Average time in minutes 40 75  Frequency of strengthening activities (# of days/week) 6 6        No data to display              Objective:  Physical Exam:  Gen: NAD, comfortable in exam room  Right hip: No deformity. FROM with 5/5 strength except 5-/5 hip abduction. Tenderness to palpation over greater trochanter and posterior, superior to this in hip external rotators and gluteal musculature. NVI distally. Negative logroll Negative faber, fadir, and piriformis stretches.   Assessment & Plan:  1. Right hip pain - 2/2 IT band syndrome, greater trochanteric pain syndrome, tendinopathy of gluteal tendons and external rotators.  She would like to try injection  again so given this today.  Try nitro patches for tendinopathy as well - discussed risks of headache, skin irritation.  F/u in 5-6 weeks.  After informed written consent timeout was performed, patient was lying on left side on exam table.  Area overlying right trochanteric bursa prepped with alcohol swab then injected with 4:1 lidocaine: depomedrol 80mg .  Patient tolerated procedure well without immediate complications.

## 2022-10-02 DIAGNOSIS — D485 Neoplasm of uncertain behavior of skin: Secondary | ICD-10-CM | POA: Diagnosis not present

## 2022-10-02 DIAGNOSIS — L814 Other melanin hyperpigmentation: Secondary | ICD-10-CM | POA: Diagnosis not present

## 2022-10-02 DIAGNOSIS — L578 Other skin changes due to chronic exposure to nonionizing radiation: Secondary | ICD-10-CM | POA: Diagnosis not present

## 2022-10-02 DIAGNOSIS — D229 Melanocytic nevi, unspecified: Secondary | ICD-10-CM | POA: Diagnosis not present

## 2022-10-02 DIAGNOSIS — B359 Dermatophytosis, unspecified: Secondary | ICD-10-CM | POA: Diagnosis not present

## 2022-10-02 DIAGNOSIS — L821 Other seborrheic keratosis: Secondary | ICD-10-CM | POA: Diagnosis not present

## 2022-10-02 DIAGNOSIS — D1801 Hemangioma of skin and subcutaneous tissue: Secondary | ICD-10-CM | POA: Diagnosis not present

## 2022-10-02 DIAGNOSIS — L309 Dermatitis, unspecified: Secondary | ICD-10-CM | POA: Diagnosis not present

## 2022-10-02 DIAGNOSIS — C44619 Basal cell carcinoma of skin of left upper limb, including shoulder: Secondary | ICD-10-CM | POA: Diagnosis not present

## 2022-10-02 DIAGNOSIS — L570 Actinic keratosis: Secondary | ICD-10-CM | POA: Diagnosis not present

## 2022-10-02 DIAGNOSIS — L988 Other specified disorders of the skin and subcutaneous tissue: Secondary | ICD-10-CM | POA: Diagnosis not present

## 2022-10-10 DIAGNOSIS — R911 Solitary pulmonary nodule: Secondary | ICD-10-CM | POA: Diagnosis not present

## 2022-10-10 DIAGNOSIS — I350 Nonrheumatic aortic (valve) stenosis: Secondary | ICD-10-CM | POA: Diagnosis not present

## 2022-10-10 DIAGNOSIS — I2510 Atherosclerotic heart disease of native coronary artery without angina pectoris: Secondary | ICD-10-CM | POA: Diagnosis not present

## 2022-10-10 DIAGNOSIS — E7800 Pure hypercholesterolemia, unspecified: Secondary | ICD-10-CM | POA: Diagnosis not present

## 2022-10-10 DIAGNOSIS — E039 Hypothyroidism, unspecified: Secondary | ICD-10-CM | POA: Diagnosis not present

## 2022-10-11 ENCOUNTER — Other Ambulatory Visit: Payer: Self-pay | Admitting: Family Medicine

## 2022-10-11 DIAGNOSIS — R911 Solitary pulmonary nodule: Secondary | ICD-10-CM

## 2022-10-29 ENCOUNTER — Other Ambulatory Visit: Payer: Self-pay | Admitting: Sports Medicine

## 2022-10-30 ENCOUNTER — Ambulatory Visit: Payer: Medicare HMO | Admitting: Family Medicine

## 2022-10-31 DIAGNOSIS — C44619 Basal cell carcinoma of skin of left upper limb, including shoulder: Secondary | ICD-10-CM | POA: Diagnosis not present

## 2022-11-19 ENCOUNTER — Telehealth: Payer: Self-pay | Admitting: Internal Medicine

## 2022-11-19 NOTE — Telephone Encounter (Signed)
Patient called and said that she is getting her teeth cleaned tomorrow and was wondering if she needed to take an antibiotic.

## 2022-11-19 NOTE — Telephone Encounter (Signed)
Called patient back to let her know that she does not meet qualifications for SBE.

## 2023-01-02 ENCOUNTER — Ambulatory Visit: Payer: Medicare HMO | Admitting: Family Medicine

## 2023-01-02 ENCOUNTER — Encounter: Payer: Self-pay | Admitting: Family Medicine

## 2023-01-02 VITALS — BP 122/84 | Ht 65.0 in | Wt 120.0 lb

## 2023-01-02 DIAGNOSIS — M25551 Pain in right hip: Secondary | ICD-10-CM

## 2023-01-02 MED ORDER — METHYLPREDNISOLONE ACETATE 80 MG/ML IJ SUSP
80.0000 mg | Freq: Once | INTRAMUSCULAR | Status: AC
  Administered 2023-01-02: 80 mg via INTRA_ARTICULAR

## 2023-01-02 NOTE — Patient Instructions (Signed)
Give the nitroglycerin patches a good 6 week trial. Continue home exercises especially the side raises, IT band stretches. Add ankle weight if the side raises are too easy for you. We repeated the shot today. Consider physical therapy but this usually isn't necessary. Follow up with [place] as needed.

## 2023-01-02 NOTE — Progress Notes (Signed)
PCP: Aliene Beams, MD  Subjective:   HPI: Patient is a 72 y.o. female here for right hip pain.  Previously diagnosed with IT band syndrome and greater trochanteric pain syndrome, tendinopathy of gluteal tendons and external rotators.  Today, reports that her pain has returned and been worsening over the past few days. Feels similar to last time. She did try the nitro patches for about 1.5 weeks but didn't feel any improvement so stopped using them. She felt the injection helped a lot last time. She has also been doing plenty of home exercises and stretches, and has been using tylenol. Previously tried dry needling and acupuncture.   Pain is on the side of her hip, worst at night if she lays on her right side. It wakes her up at night. Also hurts when she's walking up steps sometimes.   No recent fevers, swelling, traumatic injury to the area   Past Medical History:  Diagnosis Date   Arthritis    Depression    GERD (gastroesophageal reflux disease)    Heart murmur    never has caused problems   HSV infection    valtrex   Hypothyroidism    Raynaud's disease     Current Outpatient Medications on File Prior to Visit  Medication Sig Dispense Refill   acetaminophen (TYLENOL) 650 MG CR tablet Take 1,300 mg by mouth daily.     Calcium Carb-Cholecalciferol (CALCIUM + VITAMIN D3 PO) Take 1 tablet by mouth daily.     cetirizine (ZYRTEC) 10 MG tablet Take 10 mg by mouth daily.     escitalopram (LEXAPRO) 10 MG tablet Take 10 mg by mouth daily.     levothyroxine (SYNTHROID) 50 MCG tablet Take 50 mcg by mouth daily before breakfast.     MAGNESIUM PO Take 400 mg by mouth daily.     meclizine (ANTIVERT) 25 MG tablet Take 25 mg by mouth 3 (three) times daily as needed for dizziness (vertigo).     meloxicam (MOBIC) 15 MG tablet Take 1 tablet (15 mg total) by mouth daily as needed for pain. Take with food. 30 tablet 1   nitroGLYCERIN (NITRODUR - DOSED IN MG/24 HR) 0.2 mg/hr patch Apply 1/4th patch  to affected hip, change daily 30 patch 1   rosuvastatin (CRESTOR) 5 MG tablet Take 5 mg by mouth daily.     valACYclovir (VALTREX) 500 MG tablet Take 500 mg by mouth daily.     No current facility-administered medications on file prior to visit.    Past Surgical History:  Procedure Laterality Date   AMPUTATION Right 01/17/2022   Procedure: Right index finger partial amputation;  Surgeon: Gomez Cleverly, MD;  Location: [facility] OR;  Service: Orthopedics;  Laterality: Right;  with local anesthesia   THYROIDECTOMY     TOTAL HIP ARTHROPLASTY Left 06/30/2020   Procedure: LEFT TOTAL HIP ARTHROPLASTY ANTERIOR APPROACH;  Surgeon: Kathryne Hitch, MD;  Location: [facility];  Service: Orthopedics;  Laterality: Left;   TUBAL LIGATION     WISDOM TOOTH EXTRACTION      Allergies  Allergen Reactions   Codeine Nausea And Vomiting    BP 122/84   Ht 5\' 5"  (1.651 m)   Wt 120 lb (54.4 kg)   BMI 19.97 kg/m      06/28/2021    8:51 AM 09/18/2021    3:01 PM  [facility]  Frequency of aerobic exercise (# of days/week) 7 6  Average time in minutes 40 75  Frequency of  strengthening activities (# of days/week) 6 6        No data to display              Objective:  Physical Exam:  Gen: NAD, comfortable in exam room  R hip: Inspection: No gross deformities, swelling Palpation: Moderate tenderness to palpation over greater trochanter and slightly posterior to this.  Nontender over iliac crest ROM: Full range of motion Strength: 5-/5 strength with hip abduction, otherwise 5/5 strength Special tests: negative faber, fadir    Assessment & Plan:  1.  Right hip pain 2/2 IT band syndrome, greater trochanter pain syndrome, tendinopathy of gluteal tendons and external rotators.  Gave right hip injection today.  Advised to trial nitro patches for a full 6 weeks.  Otherwise continue conservative management with home exercises and Tylenol.  Follow-up as needed.  After informed  written consent timeout was performed, patient was lying on left side on exam table.  Ultrasound was used to identify the greater trochanter.  Area overlying right trochanteric bursa prepped with alcohol swab then injected with 4:1 lidocaine: depomedrol 80mg .  Patient tolerated procedure well without immediate complications.

## 2023-01-27 ENCOUNTER — Ambulatory Visit (HOSPITAL_COMMUNITY): Payer: Medicare HMO | Attending: Nurse Practitioner

## 2023-01-27 DIAGNOSIS — I2510 Atherosclerotic heart disease of native coronary artery without angina pectoris: Secondary | ICD-10-CM | POA: Insufficient documentation

## 2023-01-27 DIAGNOSIS — I700 Atherosclerosis of aorta: Secondary | ICD-10-CM | POA: Diagnosis not present

## 2023-01-27 DIAGNOSIS — I2584 Coronary atherosclerosis due to calcified coronary lesion: Secondary | ICD-10-CM | POA: Diagnosis not present

## 2023-01-27 LAB — ECHOCARDIOGRAM COMPLETE
AR max vel: 1.23 cm2
AV Area VTI: 1.2 cm2
AV Area mean vel: 1.14 cm2
AV Mean grad: 17 mmHg
AV Peak grad: 28.7 mmHg
Ao pk vel: 2.68 m/s
Area-P 1/2: 3.84 cm2
S' Lateral: 2.7 cm

## 2023-02-12 ENCOUNTER — Ambulatory Visit: Payer: Medicare HMO | Admitting: Internal Medicine

## 2023-03-04 DIAGNOSIS — U071 COVID-19: Secondary | ICD-10-CM | POA: Diagnosis not present

## 2023-03-17 ENCOUNTER — Ambulatory Visit: Payer: Medicare HMO | Attending: Internal Medicine | Admitting: Internal Medicine

## 2023-03-17 ENCOUNTER — Encounter: Payer: Self-pay | Admitting: Internal Medicine

## 2023-03-17 VITALS — BP 118/62 | HR 63 | Ht 65.0 in | Wt 122.0 lb

## 2023-03-17 DIAGNOSIS — I350 Nonrheumatic aortic (valve) stenosis: Secondary | ICD-10-CM

## 2023-03-17 DIAGNOSIS — E782 Mixed hyperlipidemia: Secondary | ICD-10-CM | POA: Diagnosis not present

## 2023-03-17 DIAGNOSIS — I700 Atherosclerosis of aorta: Secondary | ICD-10-CM | POA: Diagnosis not present

## 2023-03-17 DIAGNOSIS — I2584 Coronary atherosclerosis due to calcified coronary lesion: Secondary | ICD-10-CM | POA: Diagnosis not present

## 2023-03-17 DIAGNOSIS — I2510 Atherosclerotic heart disease of native coronary artery without angina pectoris: Secondary | ICD-10-CM

## 2023-03-17 MED ORDER — ROSUVASTATIN CALCIUM 10 MG PO TABS: 10.0000 mg | ORAL_TABLET | Freq: Every day | ORAL | 3 refills | Status: DC

## 2023-03-17 NOTE — Patient Instructions (Signed)
Medication Instructions:  Your physician has recommended you make the following change in your medication:  INCREASE: rosuvastatin (Crestor) to 10 mg by mouth once daily  *If you need a refill on your cardiac medications before your next appointment, please call your pharmacy*   Lab Work: IN 3 MONTHS: FLP, ALT, Lpa (Nothing to eat or drink 12 hours prior except water)  If you have labs (blood work) drawn today and your tests are completely normal, you will receive your results only by: MyChart Message (if you have MyChart) OR A paper copy in the mail If you have any lab test that is abnormal or we need to change your treatment, we will call you to review the results.   Testing/Procedures: SEPT 2025: Your physician has requested that you have an echocardiogram. Echocardiography is a painless test that uses sound waves to create images of your heart. It provides your doctor with information about the size and shape of your heart and how well your heart's chambers and valves are working. This procedure takes approximately one hour. There are no restrictions for this procedure. Please do NOT wear cologne, perfume, aftershave, or lotions (deodorant is allowed). Please arrive 15 minutes prior to your appointment time.    Follow-Up: At [facility], you and your health needs are our priority.  As part of our continuing mission to provide you with exceptional heart care, we have created designated Provider Care Teams.  These Care Teams include your primary Cardiologist (physician) and Advanced Practice Providers (APPs -  Physician Assistants and Nurse Practitioners) who all work together to provide you with the care you need, when you need it.   Your next appointment:   1 year(s)  Provider:   Christell Constant, MD

## 2023-03-17 NOTE — Progress Notes (Signed)
Cardiology Office Note:    Date:  03/17/2023   ID:  Tonya Ingram, DOB 12-Jun-1951, MRN [id number]  PCP:  Aliene Beams, MD   [facility] Providers Cardiologist:  Christell Constant, MD     Referring MD: Aliene Beams, MD   CC: AS follow up.  History of Present Illness:    Tonya Ingram is a 72 y.o. female with a hx of Raynaud's Disease, hypothyroidism who presents for evaluation prior to surgery (2023). 2024: Back to working out, save for running.  Discussed the use of AI scribe software for clinical note transcription with the patient, who gave verbal consent to proceed.  History of Present Illness         [name] a patient with a history of trileaflet calcific moderate aortic stenosis, hyperlipidemia, coronary artery calcifications, and aortic atherosclerosis, presents for a follow-up visit. The patient's aortic stenosis has been stable with a mean gradient of 17 and a peak velocity of 2.68 meters per second. The plan had been to repeat an echocardiogram yearly unless new symptoms developed.  The patient has been physically active, participating in workout classes and cycling, and had plans to start running. However, running has been causing discomfort in her lower back, even after surgery. Despite this, the patient has been managing well with walking and cycling.  For her hyperlipidemia, the patient had started rosuvastatin 5 mg. The last LDL level was 123, and there is no recent LDL level since she started the medication. The patient has a limited diet, mainly consisting of a protein bar, avocado toast, cottage cheese, yogurt, and rice cakes. She also consumes eggs about three or four times a week.  The patient also has concerns about the side effects of increasing her rosuvastatin dosage. She agrees to increase the dosage to 10 mg and monitor for any side effects.   Past Medical History:  Diagnosis Date   Arthritis    Depression    GERD  (gastroesophageal reflux disease)    Heart murmur    never has caused problems   HSV infection    valtrex   Hypothyroidism    Raynaud's disease     Past Surgical History:  Procedure Laterality Date   AMPUTATION Right 01/17/2022   Procedure: Right index finger partial amputation;  Surgeon: Gomez Cleverly, MD;  Location: [facility] OR;  Service: Orthopedics;  Laterality: Right;  with local anesthesia   THYROIDECTOMY     TOTAL HIP ARTHROPLASTY Left 06/30/2020   Procedure: LEFT TOTAL HIP ARTHROPLASTY ANTERIOR APPROACH;  Surgeon: Kathryne Hitch, MD;  Location: [facility] ORS;  Service: Orthopedics;  Laterality: Left;   TUBAL LIGATION     WISDOM TOOTH EXTRACTION      Current Medications: Current Meds  Medication Sig   acetaminophen (TYLENOL) 650 MG CR tablet Take 1,300 mg by mouth daily.   aspirin EC 81 MG tablet 1 tablet Orally Once a day for 30 days   Calcium Carb-Cholecalciferol (CALCIUM + VITAMIN D3 PO) Take 1 tablet by mouth daily.   cetirizine (ZYRTEC) 10 MG tablet Take 10 mg by mouth daily.   escitalopram (LEXAPRO) 10 MG tablet Take 10 mg by mouth daily.   levothyroxine (SYNTHROID) 50 MCG tablet Take 50 mcg by mouth daily before breakfast.   MAGNESIUM PO Take 400 mg by mouth daily.   meclizine (ANTIVERT) 25 MG tablet Take 25 mg by mouth 3 (three) times daily as needed for dizziness (vertigo).   meloxicam (MOBIC) 15 MG tablet Take 1 tablet (15  mg total) by mouth daily as needed for pain. Take with food.   nitroGLYCERIN (NITRODUR - DOSED IN MG/24 HR) 0.2 mg/hr patch Apply 1/4th patch to affected hip, change daily   rosuvastatin (CRESTOR) 10 MG tablet Take 1 tablet (10 mg total) by mouth daily.   valACYclovir (VALTREX) 500 MG tablet Take 500 mg by mouth daily.   [DISCONTINUED] rosuvastatin (CRESTOR) 5 MG tablet Take 5 mg by mouth daily.     Allergies:   Codeine   Social History   Socioeconomic History   Marital status: Divorced    Spouse name: Not on file   Number of children: Not  on file   Years of education: Not on file   Highest education level: Not on file  Occupational History   Not on file  Tobacco Use   Smoking status: Never   Smokeless tobacco: Never  Vaping Use   Vaping status: Never Used  Substance and Sexual Activity   Alcohol use: Never   Drug use: Never   Sexual activity: Yes    Birth control/protection: Surgical    Comment: tubal ligation  Other Topics Concern   Not on file  Social History Narrative   Not on file   Social Determinants of Health   Financial Resource Strain: Not on file  Food Insecurity: Not on file  Transportation Needs: Not on file  Physical Activity: Not on file  Stress: Not on file  Social Connections: Not on file    Social: Retired Charity fundraiser and NP  Family History: The patient's family history includes Arthritis in her mother; Hypertension in her father; Parkinson's disease in her mother; Prostate cancer in her father.  ROS:   Please see the history of present illness.     EKGs/Labs/Other Studies Reviewed:    The following studies were reviewed today:  EKG:   12/28/21: Sinus bradycardia with lateral TWI  Cardiac Studies & Procedures       ECHOCARDIOGRAM  ECHOCARDIOGRAM COMPLETE 01/27/2023  Narrative ECHOCARDIOGRAM REPORT    Patient Name:   Tonya Ingram Date of Exam: 01/27/2023 Medical Rec #:  [id number]       Height:       65.0 in Accession #:    [id number]      Weight:       120.0 lb Date of Birth:  07/08/1950      BSA:          1.592 m Patient Age:    71 years        BP:           122/84 mmHg Patient Gender: F               HR:           56 bpm. Exam Location:  [address]  Procedure: 2D Echo, Cardiac Doppler and Color Doppler  Indications:    I35.0 Aortic Stenosis  History:        Patient has prior history of Echocardiogram examinations, most recent 01/30/2022. Signs/Symptoms:Murmur; Risk Factors:Hypertension.  Sonographer:    Daphine Deutscher RDCS Referring Phys: [id number] Zachary George  [place]  IMPRESSIONS   1. Left ventricular ejection fraction, by estimation, is 65 to 70%. The left ventricle has normal function. The left ventricle has no regional wall motion abnormalities. Left ventricular diastolic parameters were normal. 2. Right ventricular systolic function is normal. The right ventricular size is normal. There is normal pulmonary artery systolic pressure. The estimated right ventricular systolic pressure is 22.9 mmHg. 3.  Left atrial size was moderately dilated. 4. The mitral valve is grossly normal. Trivial mitral valve regurgitation. 5. The aortic valve is tricuspid. Aortic valve regurgitation is not visualized. Moderate aortic valve stenosis. Aortic valve area, by VTI measures 1.20 cm. Aortic valve mean gradient measures 17.0 mmHg. Aortic valve Vmax measures 2.68 m/s. Peak gradient 28.7 mmHg, DI is 0.42. 6. The inferior vena cava is normal in size with greater than 50% respiratory variability, suggesting right atrial pressure of 3 mmHg.  Comparison(s): Changes from prior study are noted. 01/30/2022: LVEF 60-65%, mild to moderate AS - mean gradient 17 mmHg.  FINDINGS Left Ventricle: Left ventricular ejection fraction, by estimation, is 65 to 70%. The left ventricle has normal function. The left ventricle has no regional wall motion abnormalities. The left ventricular internal cavity size was normal in size. There is no left ventricular hypertrophy. Left ventricular diastolic parameters were normal.  Right Ventricle: The right ventricular size is normal. No increase in right ventricular wall thickness. Right ventricular systolic function is normal. There is normal pulmonary artery systolic pressure. The tricuspid regurgitant velocity is 2.23 m/s, and with an assumed right atrial pressure of 3 mmHg, the estimated right ventricular systolic pressure is 22.9 mmHg.  Left Atrium: Left atrial size was moderately dilated.  Right Atrium: Right atrial size was normal in  size.  Pericardium: There is no evidence of pericardial effusion.  Mitral Valve: The mitral valve is grossly normal. Trivial mitral valve regurgitation.  Tricuspid Valve: The tricuspid valve is grossly normal. Tricuspid valve regurgitation is trivial.  Aortic Valve: The aortic valve is tricuspid. Aortic valve regurgitation is not visualized. Moderate aortic stenosis is present. Aortic valve mean gradient measures 17.0 mmHg. Aortic valve peak gradient measures 28.7 mmHg. Aortic valve area, by VTI measures 1.20 cm.  Pulmonic Valve: The pulmonic valve was grossly normal. Pulmonic valve regurgitation is trivial.  Aorta: The aortic root and ascending aorta are structurally normal, with no evidence of dilitation.  Venous: The inferior vena cava is normal in size with greater than 50% respiratory variability, suggesting right atrial pressure of 3 mmHg.  IAS/Shunts: No atrial level shunt detected by color flow Doppler.   LEFT VENTRICLE PLAX 2D LVIDd:         4.90 cm   Diastology LVIDs:         2.70 cm   LV e' medial:    9.14 cm/s LV PW:         0.80 cm   LV E/e' medial:  8.2 LV IVS:        0.70 cm   LV e' lateral:   11.70 cm/s LVOT diam:     1.90 cm   LV E/e' lateral: 6.4 LV SV:         78 LV SV Index:   49 LVOT Area:     2.84 cm   RIGHT VENTRICLE             IVC RV Basal diam:  3.90 cm     IVC diam: 1.40 cm RV S prime:     12.90 cm/s TAPSE (M-mode): 2.9 cm  LEFT ATRIUM             Index        RIGHT ATRIUM           Index LA diam:        4.20 cm 2.64 cm/m   RA Area:     15.80 cm LA Vol (A2C):   60.4 ml 37.94  ml/m  RA Volume:   44.80 ml  28.14 ml/m LA Vol (A4C):   66.5 ml 41.77 ml/m LA Biplane Vol: 64.0 ml 40.20 ml/m AORTIC VALVE AV Area (Vmax):    1.23 cm AV Area (Vmean):   1.14 cm AV Area (VTI):     1.20 cm AV Vmax:           267.80 cm/s AV Vmean:          193.200 cm/s AV VTI:            0.649 m AV Peak Grad:      28.7 mmHg AV Mean Grad:      17.0 mmHg LVOT  Vmax:         116.00 cm/s LVOT Vmean:        77.700 cm/s LVOT VTI:          0.274 m LVOT/AV VTI ratio: 0.42  AORTA Ao Root diam: 2.80 cm Ao Asc diam:  2.80 cm  MITRAL VALVE               TRICUSPID VALVE MV Area (PHT): 3.84 cm    TR Peak grad:   19.9 mmHg MV Decel Time: 198 msec    TR Vmax:        223.00 cm/s MV E velocity: 75.20 cm/s MV A velocity: 63.15 cm/s  SHUNTS MV E/A ratio:  1.19        Systemic VTI:  0.27 m Systemic Diam: 1.90 cm  Zoila Shutter MD Electronically signed by Zoila Shutter MD Signature Date/Time: 01/27/2023/12:06:58 PM    Final              Recent Labs: No results found for requested labs within last 365 days.  Recent Lipid Panel No results found for: "CHOL", "TRIG", "HDL", "CHOLHDL", "VLDL", "LDLCALC", "LDLDIRECT"      Physical Exam:    VS:  BP 118/62   Pulse 63   Ht 5\' 5"  (1.651 m)   Wt 122 lb (55.3 kg)   SpO2 97%   BMI 20.30 kg/m     Wt Readings from Last 3 Encounters:  03/17/23 122 lb (55.3 kg)  01/02/23 120 lb (54.4 kg)  09/26/22 125 lb (56.7 kg)    GEN:  Well nourished, well developed in no acute distress HEENT: Normal NECK: No JVD CARDIAC: RRR, prominent systolic crescendo, no rubs, gallops RESPIRATORY:  Clear to auscultation without rales, wheezing or rhonchi  ABDOMEN: Soft, non-tender, non-distended MUSCULOSKELETAL:  No edema; No deformity  SKIN: Warm and dry NEUROLOGIC:  Alert and oriented x 3 PSYCHIATRIC:  Normal affect   ASSESSMENT:    1. Coronary artery calcification   2. Aortic atherosclerosis (HCC)   3. Nonrheumatic aortic valve stenosis   4. Mixed hyperlipidemia    PLAN:    Moderate Tri-leaflet Aortic Stenosis - Moderate, calcific, trileaflet aortic stenosis with a mean gradient of 17 and DVI of 0.42. Asymptomatic. -Repeat echocardiogram yearly in August for monitoring. - reviewed images with patient, we will target LDL < 55  Hyperlipidemia - LDL last checked was 123. Currently on rosuvastatin 5mg  daily.  Discussed the need for more aggressive management due to presence of coronary artery calcifications and aortic atherosclerosis. -Increase rosuvastatin to 10mg  daily per patient preference (I suspect she will need higher goal) -Check lipids, LP(a), and ALT in three months.  Coronary Artery Calcifications and Aortic Atherosclerosis Aggressive prevention measures discussed due to presence of these conditions. -Continue with increased rosuvastatin dose as part of prevention strategy.  Mild Mitral Regurgitation Will be monitored along with aortic stenosis. -Repeat echocardiogram yearly in August for monitoring.  One year with me      Medication Adjustments/Labs and Tests Ordered: Current medicines are reviewed at length with the patient today.  Concerns regarding medicines are outlined above.  Orders Placed This Encounter  Procedures   ALT   Lipid panel   Lipoprotein A (LPA)   EKG 12-Lead   ECHOCARDIOGRAM COMPLETE   Meds ordered this encounter  Medications   rosuvastatin (CRESTOR) 10 MG tablet    Sig: Take 1 tablet (10 mg total) by mouth daily.    Dispense:  90 tablet    Refill:  3    Patient Instructions  Medication Instructions:  Your physician has recommended you make the following change in your medication:  INCREASE: rosuvastatin (Crestor) to 10 mg by mouth once daily  *If you need a refill on your cardiac medications before your next appointment, please call your pharmacy*   Lab Work: IN 3 MONTHS: FLP, ALT, Lpa (Nothing to eat or drink 12 hours prior except water)  If you have labs (blood work) drawn today and your tests are completely normal, you will receive your results only by: MyChart Message (if you have MyChart) OR A paper copy in the mail If you have any lab test that is abnormal or we need to change your treatment, we will call you to review the results.   Testing/Procedures: SEPT 2025: Your physician has requested that you have an echocardiogram.  Echocardiography is a painless test that uses sound waves to create images of your heart. It provides your doctor with information about the size and shape of your heart and how well your heart's chambers and valves are working. This procedure takes approximately one hour. There are no restrictions for this procedure. Please do NOT wear cologne, perfume, aftershave, or lotions (deodorant is allowed). Please arrive 15 minutes prior to your appointment time.    Follow-Up: At [facility], you and your health needs are our priority.  As part of our continuing mission to provide you with exceptional heart care, we have created designated Provider Care Teams.  These Care Teams include your primary Cardiologist (physician) and Advanced Practice Providers (APPs -  Physician Assistants and Nurse Practitioners) who all work together to provide you with the care you need, when you need it.   Your next appointment:   1 year(s)  Provider:   Christell Constant, MD        Signed, Christell Constant, MD  03/17/2023 10:13 AM    [facility]

## 2023-04-05 ENCOUNTER — Other Ambulatory Visit: Payer: Self-pay | Admitting: Family Medicine

## 2023-04-18 ENCOUNTER — Encounter: Payer: Self-pay | Admitting: Family Medicine

## 2023-05-06 ENCOUNTER — Other Ambulatory Visit: Payer: Self-pay

## 2023-05-06 MED ORDER — NITROGLYCERIN 0.2 MG/HR TD PT24: MEDICATED_PATCH | TRANSDERMAL | 1 refills | Status: DC

## 2023-06-08 ENCOUNTER — Other Ambulatory Visit: Payer: Self-pay

## 2023-06-08 ENCOUNTER — Encounter (HOSPITAL_BASED_OUTPATIENT_CLINIC_OR_DEPARTMENT_OTHER): Payer: Self-pay | Admitting: Emergency Medicine

## 2023-06-08 ENCOUNTER — Ambulatory Visit (HOSPITAL_BASED_OUTPATIENT_CLINIC_OR_DEPARTMENT_OTHER)
Admission: EM | Admit: 2023-06-08 | Discharge: 2023-06-09 | Disposition: A | Discharge status: Home / Self Care | Payer: Medicare HMO | Attending: Surgery | Admitting: Surgery

## 2023-06-08 ENCOUNTER — Emergency Department (HOSPITAL_BASED_OUTPATIENT_CLINIC_OR_DEPARTMENT_OTHER): Payer: Medicare HMO

## 2023-06-08 DIAGNOSIS — I2510 Atherosclerotic heart disease of native coronary artery without angina pectoris: Secondary | ICD-10-CM | POA: Diagnosis not present

## 2023-06-08 DIAGNOSIS — F32A Depression, unspecified: Secondary | ICD-10-CM | POA: Diagnosis not present

## 2023-06-08 DIAGNOSIS — K353 Acute appendicitis with localized peritonitis, without perforation or gangrene: Secondary | ICD-10-CM

## 2023-06-08 DIAGNOSIS — K219 Gastro-esophageal reflux disease without esophagitis: Secondary | ICD-10-CM | POA: Insufficient documentation

## 2023-06-08 DIAGNOSIS — K449 Diaphragmatic hernia without obstruction or gangrene: Secondary | ICD-10-CM | POA: Insufficient documentation

## 2023-06-08 DIAGNOSIS — R1031 Right lower quadrant pain: Secondary | ICD-10-CM | POA: Diagnosis not present

## 2023-06-08 DIAGNOSIS — K3589 Other acute appendicitis without perforation or gangrene: Secondary | ICD-10-CM | POA: Diagnosis present

## 2023-06-08 DIAGNOSIS — K3580 Unspecified acute appendicitis: Secondary | ICD-10-CM | POA: Diagnosis not present

## 2023-06-08 DIAGNOSIS — I700 Atherosclerosis of aorta: Secondary | ICD-10-CM | POA: Diagnosis not present

## 2023-06-08 DIAGNOSIS — I7300 Raynaud's syndrome without gangrene: Secondary | ICD-10-CM | POA: Diagnosis not present

## 2023-06-08 DIAGNOSIS — Z79899 Other long term (current) drug therapy: Secondary | ICD-10-CM | POA: Insufficient documentation

## 2023-06-08 DIAGNOSIS — Z79890 Hormone replacement therapy (postmenopausal): Secondary | ICD-10-CM | POA: Diagnosis not present

## 2023-06-08 DIAGNOSIS — Z79624 Long term (current) use of inhibitors of nucleotide synthesis: Secondary | ICD-10-CM | POA: Diagnosis not present

## 2023-06-08 DIAGNOSIS — E039 Hypothyroidism, unspecified: Secondary | ICD-10-CM | POA: Insufficient documentation

## 2023-06-08 DIAGNOSIS — I739 Peripheral vascular disease, unspecified: Secondary | ICD-10-CM | POA: Diagnosis not present

## 2023-06-08 DIAGNOSIS — I350 Nonrheumatic aortic (valve) stenosis: Secondary | ICD-10-CM | POA: Insufficient documentation

## 2023-06-08 DIAGNOSIS — M1990 Unspecified osteoarthritis, unspecified site: Secondary | ICD-10-CM | POA: Diagnosis not present

## 2023-06-08 LAB — COMPREHENSIVE METABOLIC PANEL
ALT: 19 U/L (ref 0–44)
AST: 25 U/L (ref 15–41)
Albumin: 3.9 g/dL (ref 3.5–5.0)
Alkaline Phosphatase: 57 U/L (ref 38–126)
Anion gap: 7 (ref 5–15)
BUN: 28 mg/dL — ABNORMAL HIGH (ref 8–23)
CO2: 24 mmol/L (ref 22–32)
Calcium: 9.1 mg/dL (ref 8.9–10.3)
Chloride: 104 mmol/L (ref 98–111)
Creatinine, Ser: 0.61 mg/dL (ref 0.44–1.00)
GFR, Estimated: >60 mL/min (ref >60)
Glucose, Bld: 101 mg/dL — ABNORMAL HIGH (ref 70–99)
Potassium: 4.3 mmol/L (ref 3.5–5.1)
Sodium: 135 mmol/L (ref 135–145)
Total Bilirubin: 0.5 mg/dL (ref <1.2)
Total Protein: 6.9 g/dL (ref 6.5–8.1)

## 2023-06-08 LAB — CBC
HCT: 35.4 % — ABNORMAL LOW (ref 36.0–46.0)
Hemoglobin: 11.6 g/dL — ABNORMAL LOW (ref 12.0–15.0)
MCH: 30.7 pg (ref 26.0–34.0)
MCHC: 32.8 g/dL (ref 30.0–36.0)
MCV: 93.7 fL (ref 80.0–100.0)
Platelets: 259 10*3/uL (ref 150–400)
RBC: 3.78 MIL/uL — ABNORMAL LOW (ref 3.87–5.11)
RDW: 13.2 % (ref 11.5–15.5)
WBC: 7.3 10*3/uL (ref 4.0–10.5)
nRBC: 0 % (ref 0.0–0.2)

## 2023-06-08 LAB — LIPASE, BLOOD: Lipase: 29 U/L (ref 11–51)

## 2023-06-08 MED ORDER — ONDANSETRON HCL 4 MG/2ML IJ SOLN
4.0000 mg | Freq: Once | INTRAMUSCULAR | Status: AC
  Administered 2023-06-09: 4 mg via INTRAVENOUS
  Filled 2023-06-08: qty 2

## 2023-06-08 MED ORDER — IOHEXOL 300 MG/ML  SOLN
100.0000 mL | Freq: Once | INTRAMUSCULAR | Status: AC | PRN
  Administered 2023-06-09: 100 mL via INTRAVENOUS

## 2023-06-08 MED ORDER — MORPHINE SULFATE (PF) 4 MG/ML IV SOLN
4.0000 mg | Freq: Once | INTRAVENOUS | Status: AC
  Administered 2023-06-09: 4 mg via INTRAVENOUS
  Filled 2023-06-08: qty 1

## 2023-06-08 NOTE — ED Provider Notes (Signed)
[place] [facility] Provider Note   CSN: [id number] Arrival date & time: 06/08/23  2130     History {Add pertinent medical, surgical, social history, OB history to HPI:1} Chief Complaint  Patient presents with   Abdominal Pain    Tonya Ingram is a 72 y.o. female.  72 yo F with a chief complaint of right lower quadrant abdominal pain.  Going on for a couple days.  Getting progressively worse.  No fevers no vomiting no trauma.  Denies urinary symptoms.  Denies history of abdominal surgery.   Abdominal Pain      Home Medications Prior to Admission medications   Medication Sig Start Date End Date Taking? Authorizing Provider  acetaminophen (TYLENOL) 650 MG CR tablet Take 1,300 mg by mouth daily.    [provider]  aspirin EC 81 MG tablet 1 tablet Orally Once a day for 30 days 10/10/22   [provider]  Calcium Carb-Cholecalciferol (CALCIUM + VITAMIN D3 PO) Take 1 tablet by mouth daily.    [provider]  cetirizine (ZYRTEC) 10 MG tablet Take 10 mg by mouth daily.    [provider]  escitalopram (LEXAPRO) 10 MG tablet Take 10 mg by mouth daily.    [provider]  levothyroxine (SYNTHROID) 50 MCG tablet Take 50 mcg by mouth daily before breakfast.    [provider]  MAGNESIUM PO Take 400 mg by mouth daily.    [provider]  meclizine (ANTIVERT) 25 MG tablet Take 25 mg by mouth 3 (three) times daily as needed for dizziness (vertigo).    [provider]  meloxicam (MOBIC) 15 MG tablet Take 1 tablet (15 mg total) by mouth daily as needed for pain. Take with food. 08/28/22   Ralene Cork, DO  nitroGLYCERIN (NITRODUR - DOSED IN MG/24 HR) 0.2 mg/hr patch Apply 1/4th patch to affected hip, change daily 05/06/23   Hudnall, Azucena Fallen, MD  rosuvastatin (CRESTOR) 10 MG tablet Take 1 tablet (10 mg total) by mouth daily. 03/17/23 06/15/23  Christell Constant, MD   valACYclovir (VALTREX) 500 MG tablet Take 500 mg by mouth daily. 12/01/21   [provider]      Allergies    Codeine    Review of Systems   Review of Systems  Gastrointestinal:  Positive for abdominal pain.    Physical Exam Updated Vital Signs BP 127/74 (BP Location: Right Arm)   Pulse 69   Temp 97.7 F (36.5 C)   Resp 18   SpO2 99%  Physical Exam Vitals and nursing note reviewed.  Constitutional:      General: She is not in acute distress.    Appearance: She is well-developed. She is not diaphoretic.  HENT:     Head: Normocephalic and atraumatic.  Eyes:     Pupils: Pupils are equal, round, and reactive to light.  Cardiovascular:     Rate and Rhythm: Normal rate and regular rhythm.     Heart sounds: No murmur heard.    No friction rub. No gallop.  Pulmonary:     Effort: Pulmonary effort is normal.     Breath sounds: No wheezing or rales.  Abdominal:     General: There is no distension.     Palpations: Abdomen is soft.     Tenderness: There is abdominal tenderness.     Comments: Pain and guarding to the right lower quadrant.  Musculoskeletal:        General: No  tenderness.     Cervical back: Normal range of motion and neck supple.  Skin:    General: Skin is warm and dry.  Neurological:     Mental Status: She is alert and oriented to person, place, and time.  Psychiatric:        Behavior: Behavior normal.     ED Results / Procedures / Treatments   Labs (all labs ordered are listed, but only abnormal results are displayed) Labs Reviewed  COMPREHENSIVE METABOLIC PANEL - Abnormal; Notable for the following components:      Result Value   Glucose, Bld 101 (*)    BUN 28 (*)    All other components within normal limits  CBC - Abnormal; Notable for the following components:   RBC 3.78 (*)    Hemoglobin 11.6 (*)    HCT 35.4 (*)    All other components within normal limits  LIPASE, BLOOD  URINALYSIS, ROUTINE W REFLEX MICROSCOPIC     EKG None  Radiology No results found.  Procedures Procedures  {Document cardiac monitor, telemetry assessment procedure when appropriate:1}  Medications Ordered in ED Medications  morphine (PF) 4 MG/ML injection 4 mg (has no administration in time range)  ondansetron (ZOFRAN) injection 4 mg (has no administration in time range)    ED Course/ Medical Decision Making/ A&P   {   Click here for ABCD2, HEART and other calculatorsREFRESH Note before signing :1}                              Medical Decision Making Amount and/or Complexity of Data Reviewed Labs: ordered. Radiology: ordered.  Risk Prescription drug management.   72 yo F with a chief complaints of right lower quadrant abdominal pain.  Going on for a couple days.  Focally tender to the right lower quadrant.  Will obtain CT imaging.  Blood work is resulted without leukocytosis, no significant electrolyte abnormalities LFTs and lipase are unremarkable.  {Document critical care time when appropriate:1} {Document review of labs and clinical decision tools ie heart score, Chads2Vasc2 etc:1}  {Document your independent review of radiology images, and any outside records:1} {Document your discussion with family members, caretakers, and with consultants:1} {Document social determinants of health affecting pt's care:1} {Document your decision making why or why not admission, treatments were needed:1} Final Clinical Impression(s) / ED Diagnoses Final diagnoses:  None    Rx / DC Orders ED Discharge Orders     None

## 2023-06-08 NOTE — ED Triage Notes (Signed)
Pt reports LRQ abd pain that has been getting worse today. Denies N/V/D Reports she still has her appendix

## 2023-06-09 ENCOUNTER — Encounter (HOSPITAL_COMMUNITY): Payer: Self-pay | Admitting: *Deleted

## 2023-06-09 ENCOUNTER — Emergency Department (HOSPITAL_COMMUNITY): Payer: Medicare HMO | Admitting: Registered Nurse

## 2023-06-09 ENCOUNTER — Emergency Department (HOSPITAL_BASED_OUTPATIENT_CLINIC_OR_DEPARTMENT_OTHER): Payer: Medicare HMO | Admitting: Registered Nurse

## 2023-06-09 ENCOUNTER — Encounter (HOSPITAL_COMMUNITY)
Admission: EM | Disposition: A | Discharge status: Home / Self Care | Payer: Self-pay | Source: Home / Self Care | Attending: Emergency Medicine

## 2023-06-09 DIAGNOSIS — Z79890 Hormone replacement therapy (postmenopausal): Secondary | ICD-10-CM | POA: Diagnosis not present

## 2023-06-09 DIAGNOSIS — Z79899 Other long term (current) drug therapy: Secondary | ICD-10-CM | POA: Diagnosis not present

## 2023-06-09 DIAGNOSIS — I350 Nonrheumatic aortic (valve) stenosis: Secondary | ICD-10-CM | POA: Diagnosis not present

## 2023-06-09 DIAGNOSIS — K219 Gastro-esophageal reflux disease without esophagitis: Secondary | ICD-10-CM | POA: Diagnosis not present

## 2023-06-09 DIAGNOSIS — K36 Other appendicitis: Secondary | ICD-10-CM | POA: Diagnosis not present

## 2023-06-09 DIAGNOSIS — K353 Acute appendicitis with localized peritonitis, without perforation or gangrene: Secondary | ICD-10-CM | POA: Diagnosis not present

## 2023-06-09 DIAGNOSIS — K388 Other specified diseases of appendix: Secondary | ICD-10-CM | POA: Diagnosis not present

## 2023-06-09 DIAGNOSIS — R1031 Right lower quadrant pain: Secondary | ICD-10-CM | POA: Diagnosis not present

## 2023-06-09 DIAGNOSIS — Z79624 Long term (current) use of inhibitors of nucleotide synthesis: Secondary | ICD-10-CM | POA: Diagnosis not present

## 2023-06-09 DIAGNOSIS — F32A Depression, unspecified: Secondary | ICD-10-CM | POA: Diagnosis not present

## 2023-06-09 DIAGNOSIS — K3589 Other acute appendicitis without perforation or gangrene: Secondary | ICD-10-CM | POA: Diagnosis not present

## 2023-06-09 DIAGNOSIS — I7300 Raynaud's syndrome without gangrene: Secondary | ICD-10-CM | POA: Diagnosis not present

## 2023-06-09 DIAGNOSIS — I700 Atherosclerosis of aorta: Secondary | ICD-10-CM | POA: Diagnosis not present

## 2023-06-09 DIAGNOSIS — K449 Diaphragmatic hernia without obstruction or gangrene: Secondary | ICD-10-CM | POA: Diagnosis not present

## 2023-06-09 DIAGNOSIS — I2510 Atherosclerotic heart disease of native coronary artery without angina pectoris: Secondary | ICD-10-CM | POA: Diagnosis not present

## 2023-06-09 DIAGNOSIS — I739 Peripheral vascular disease, unspecified: Secondary | ICD-10-CM | POA: Diagnosis not present

## 2023-06-09 DIAGNOSIS — M1990 Unspecified osteoarthritis, unspecified site: Secondary | ICD-10-CM | POA: Diagnosis not present

## 2023-06-09 DIAGNOSIS — K3580 Unspecified acute appendicitis: Secondary | ICD-10-CM | POA: Diagnosis not present

## 2023-06-09 DIAGNOSIS — E039 Hypothyroidism, unspecified: Secondary | ICD-10-CM | POA: Diagnosis not present

## 2023-06-09 HISTORY — PX: LAPAROSCOPIC APPENDECTOMY: SHX408

## 2023-06-09 LAB — URINALYSIS, ROUTINE W REFLEX MICROSCOPIC
Bilirubin Urine: NEGATIVE
Glucose, UA: NEGATIVE mg/dL
Ketones, ur: NEGATIVE mg/dL
Nitrite: NEGATIVE
Protein, ur: NEGATIVE mg/dL
Specific Gravity, Urine: 1.01 (ref 1.005–1.030)
pH: 7 (ref 5.0–8.0)

## 2023-06-09 LAB — URINALYSIS, MICROSCOPIC (REFLEX)

## 2023-06-09 SURGERY — APPENDECTOMY, LAPAROSCOPIC
Anesthesia: General | Site: Abdomen

## 2023-06-09 MED ORDER — BUPIVACAINE-EPINEPHRINE 0.25% -1:200000 IJ SOLN
INTRAMUSCULAR | Status: AC
  Filled 2023-06-09: qty 1

## 2023-06-09 MED ORDER — ACETAMINOPHEN 10 MG/ML IV SOLN
INTRAVENOUS | Status: AC
  Administered 2023-06-09: 1000 mg via INTRAVENOUS
  Filled 2023-06-09: qty 100

## 2023-06-09 MED ORDER — PROPOFOL 10 MG/ML IV BOLUS
INTRAVENOUS | Status: AC
  Filled 2023-06-09: qty 20

## 2023-06-09 MED ORDER — OXYCODONE HCL 5 MG/5ML PO SOLN: 5.0000 mg | Freq: Once | ORAL | Status: AC | PRN

## 2023-06-09 MED ORDER — MORPHINE SULFATE (PF) 4 MG/ML IV SOLN
4.0000 mg | Freq: Once | INTRAVENOUS | Status: AC
  Administered 2023-06-09: 4 mg via INTRAVENOUS
  Filled 2023-06-09: qty 1

## 2023-06-09 MED ORDER — ONDANSETRON HCL 4 MG/2ML IJ SOLN
INTRAMUSCULAR | Status: AC
  Filled 2023-06-09: qty 2

## 2023-06-09 MED ORDER — DEXMEDETOMIDINE HCL IN NACL 80 MCG/20ML IV SOLN
INTRAVENOUS | Status: DC | PRN
  Administered 2023-06-09: 8 ug via INTRAVENOUS

## 2023-06-09 MED ORDER — LIDOCAINE 2% (20 MG/ML) 5 ML SYRINGE
INTRAMUSCULAR | Status: DC | PRN
  Administered 2023-06-09: 100 mg via INTRAVENOUS

## 2023-06-09 MED ORDER — HYDROCODONE-ACETAMINOPHEN 5-325 MG PO TABS: 1.0000 | ORAL_TABLET | Freq: Four times a day (QID) | ORAL | 0 refills | Status: AC | PRN

## 2023-06-09 MED ORDER — PHENYLEPHRINE 80 MCG/ML (10ML) SYRINGE FOR IV PUSH (FOR BLOOD PRESSURE SUPPORT)
PREFILLED_SYRINGE | INTRAVENOUS | Status: AC
  Filled 2023-06-09: qty 10

## 2023-06-09 MED ORDER — PIPERACILLIN-TAZOBACTAM 3.375 G IVPB
3.3750 g | Freq: Three times a day (TID) | INTRAVENOUS | Status: DC
  Administered 2023-06-09: 3.375 g via INTRAVENOUS
  Filled 2023-06-09: qty 50

## 2023-06-09 MED ORDER — ROCURONIUM BROMIDE 10 MG/ML (PF) SYRINGE
PREFILLED_SYRINGE | INTRAVENOUS | Status: DC | PRN
  Administered 2023-06-09: 40 mg via INTRAVENOUS

## 2023-06-09 MED ORDER — EPHEDRINE SULFATE-NACL 50-0.9 MG/10ML-% IV SOSY
PREFILLED_SYRINGE | INTRAVENOUS | Status: DC | PRN
  Administered 2023-06-09: 5 mg via INTRAVENOUS

## 2023-06-09 MED ORDER — DEXAMETHASONE SODIUM PHOSPHATE 10 MG/ML IJ SOLN
INTRAMUSCULAR | Status: AC
  Filled 2023-06-09: qty 1

## 2023-06-09 MED ORDER — PHENYLEPHRINE 80 MCG/ML (10ML) SYRINGE FOR IV PUSH (FOR BLOOD PRESSURE SUPPORT)
PREFILLED_SYRINGE | INTRAVENOUS | Status: DC | PRN
  Administered 2023-06-09: 80 ug via INTRAVENOUS

## 2023-06-09 MED ORDER — BUPIVACAINE-EPINEPHRINE 0.25% -1:200000 IJ SOLN
INTRAMUSCULAR | Status: DC | PRN
  Administered 2023-06-09: 17 mL

## 2023-06-09 MED ORDER — ROCURONIUM BROMIDE 10 MG/ML (PF) SYRINGE
PREFILLED_SYRINGE | INTRAVENOUS | Status: AC
  Filled 2023-06-09: qty 10

## 2023-06-09 MED ORDER — CHLORHEXIDINE GLUCONATE CLOTH 2 % EX PADS: 6.0000 | MEDICATED_PAD | Freq: Once | CUTANEOUS | Status: DC

## 2023-06-09 MED ORDER — HYDROMORPHONE HCL 1 MG/ML IJ SOLN: 0.2500 mg | INTRAMUSCULAR | Status: DC | PRN

## 2023-06-09 MED ORDER — EPHEDRINE 5 MG/ML INJ
INTRAVENOUS | Status: AC
  Filled 2023-06-09: qty 5

## 2023-06-09 MED ORDER — OXYCODONE HCL 5 MG PO TABS: 5.0000 mg | ORAL_TABLET | Freq: Once | ORAL | Status: AC | PRN

## 2023-06-09 MED ORDER — ACETAMINOPHEN 10 MG/ML IV SOLN
1000.0000 mg | Freq: Once | INTRAVENOUS | Status: DC | PRN
Start: 2023-06-09 — End: 2023-06-09

## 2023-06-09 MED ORDER — STERILE WATER FOR IRRIGATION IR SOLN
Status: DC | PRN
  Administered 2023-06-09: 1000 mL

## 2023-06-09 MED ORDER — OXYCODONE HCL 5 MG PO TABS
ORAL_TABLET | ORAL | Status: AC
  Administered 2023-06-09: 5 mg via ORAL
  Filled 2023-06-09: qty 1

## 2023-06-09 MED ORDER — PIPERACILLIN-TAZOBACTAM 3.375 G IVPB 30 MIN
3.3750 g | Freq: Once | INTRAVENOUS | Status: AC
  Administered 2023-06-09: 3.375 g via INTRAVENOUS
  Filled 2023-06-09: qty 50

## 2023-06-09 MED ORDER — DEXAMETHASONE SODIUM PHOSPHATE 10 MG/ML IJ SOLN
INTRAMUSCULAR | Status: DC | PRN
  Administered 2023-06-09: 8 mg via INTRAVENOUS

## 2023-06-09 MED ORDER — MIDAZOLAM HCL 5 MG/5ML IJ SOLN
INTRAMUSCULAR | Status: DC | PRN
  Administered 2023-06-09: 2 mg via INTRAVENOUS

## 2023-06-09 MED ORDER — FENTANYL CITRATE (PF) 100 MCG/2ML IJ SOLN
INTRAMUSCULAR | Status: DC | PRN
  Administered 2023-06-09 (×2): 25 ug via INTRAVENOUS
  Administered 2023-06-09: 50 ug via INTRAVENOUS

## 2023-06-09 MED ORDER — ONDANSETRON HCL 4 MG/2ML IJ SOLN
INTRAMUSCULAR | Status: DC | PRN
  Administered 2023-06-09: 4 mg via INTRAVENOUS

## 2023-06-09 MED ORDER — SODIUM CHLORIDE 0.9 % IV SOLN: INTRAVENOUS | Status: DC | PRN

## 2023-06-09 MED ORDER — FENTANYL CITRATE (PF) 100 MCG/2ML IJ SOLN
INTRAMUSCULAR | Status: AC
  Filled 2023-06-09: qty 2

## 2023-06-09 MED ORDER — SUGAMMADEX SODIUM 200 MG/2ML IV SOLN
INTRAVENOUS | Status: DC | PRN
  Administered 2023-06-09: 150 mg via INTRAVENOUS

## 2023-06-09 MED ORDER — ONDANSETRON HCL 4 MG/2ML IJ SOLN: 4.0000 mg | Freq: Once | INTRAMUSCULAR | Status: DC | PRN

## 2023-06-09 MED ORDER — MIDAZOLAM HCL 2 MG/2ML IJ SOLN
INTRAMUSCULAR | Status: AC
  Filled 2023-06-09: qty 2

## 2023-06-09 MED ORDER — PROPOFOL 10 MG/ML IV BOLUS
INTRAVENOUS | Status: DC | PRN
  Administered 2023-06-09: 110 mg via INTRAVENOUS

## 2023-06-09 SURGICAL SUPPLY — 47 items
APPLIER CLIP 5 13 M/L LIGAMAX5 (MISCELLANEOUS)
APPLIER CLIP ROT 10 11.4 M/L (STAPLE)
BAG COUNTER SPONGE SURGICOUNT (BAG) IMPLANT
CABLE HIGH FREQUENCY MONO STRZ (ELECTRODE) ×1 IMPLANT
CHLORAPREP W/TINT 26 (MISCELLANEOUS) ×1 IMPLANT
CLIP APPLIE 5 13 M/L LIGAMAX5 (MISCELLANEOUS) IMPLANT
CLIP APPLIE ROT 10 11.4 M/L (STAPLE) IMPLANT
COVER SURGICAL LIGHT HANDLE (MISCELLANEOUS) ×1 IMPLANT
CUTTER FLEX LINEAR 45M (STAPLE) IMPLANT
DERMABOND ADVANCED .7 DNX12 (GAUZE/BANDAGES/DRESSINGS) ×1 IMPLANT
DRAIN CHANNEL 19F RND (DRAIN) IMPLANT
ELECT REM PT RETURN 15FT ADLT (MISCELLANEOUS) ×1 IMPLANT
ENDOLOOP SUT PDS II 0 18 (SUTURE) IMPLANT
EVACUATOR SILICONE 100CC (DRAIN) IMPLANT
GLOVE BIO SURGEON STRL SZ 6 (GLOVE) ×1 IMPLANT
GLOVE BIOGEL PI MICRO STRL 5.5 (GLOVE) ×1 IMPLANT
GLOVE INDICATOR 6.5 STRL GRN (GLOVE) ×1 IMPLANT
GOWN STRL REUS W/ TWL LRG LVL3 (GOWN DISPOSABLE) ×1 IMPLANT
GRASPER SUT TROCAR 14GX15 (MISCELLANEOUS) IMPLANT
IRRIG SUCT STRYKERFLOW 2 WTIP (MISCELLANEOUS) ×1
IRRIGATION SUCT STRKRFLW 2 WTP (MISCELLANEOUS) ×1 IMPLANT
KIT BASIN OR (CUSTOM PROCEDURE TRAY) ×1 IMPLANT
KIT TURNOVER KIT A (KITS) IMPLANT
NDL INSUFFLATION 14GA 120MM (NEEDLE) IMPLANT
NEEDLE INSUFFLATION 14GA 120MM (NEEDLE) IMPLANT
PENCIL SMOKE EVACUATOR (MISCELLANEOUS) IMPLANT
RELOAD 45 VASCULAR/THIN (ENDOMECHANICALS) ×1 IMPLANT
RELOAD STAPLE 45 2.5 WHT GRN (ENDOMECHANICALS) IMPLANT
RELOAD STAPLE 45 3.5 BLU ETS (ENDOMECHANICALS) IMPLANT
RELOAD STAPLE TA45 3.5 REG BLU (ENDOMECHANICALS) IMPLANT
SCISSORS LAP 5X35 DISP (ENDOMECHANICALS) ×1 IMPLANT
SET TUBE SMOKE EVAC HIGH FLOW (TUBING) ×1 IMPLANT
SHEARS HARMONIC 36 ACE (MISCELLANEOUS) IMPLANT
SLEEVE Z-THREAD 5X100MM (TROCAR) ×1 IMPLANT
SPIKE FLUID TRANSFER (MISCELLANEOUS) ×1 IMPLANT
SUT ETHILON 2 0 PS N (SUTURE) IMPLANT
SUT MNCRL AB 4-0 PS2 18 (SUTURE) ×1 IMPLANT
SYS BAG RETRIEVAL 10MM (BASKET) ×1
SYSTEM BAG RETRIEVAL 10MM (BASKET) ×1 IMPLANT
TOWEL OR 17X26 10 PK STRL BLUE (TOWEL DISPOSABLE) ×1 IMPLANT
TOWEL OR NON WOVEN STRL DISP B (DISPOSABLE) ×1 IMPLANT
TRAY FOLEY MTR SLVR 14FR STAT (SET/KITS/TRAYS/PACK) IMPLANT
TRAY FOLEY MTR SLVR 16FR STAT (SET/KITS/TRAYS/PACK) IMPLANT
TRAY LAPAROSCOPIC (CUSTOM PROCEDURE TRAY) ×1 IMPLANT
TROCAR ADV FIXATION 12X100MM (TROCAR) ×1 IMPLANT
TROCAR BALLN 12MMX100 BLUNT (TROCAR) ×1 IMPLANT
TROCAR Z-THREAD OPTICAL 5X100M (TROCAR) ×1 IMPLANT

## 2023-06-09 NOTE — ED Provider Notes (Signed)
Signout from Dr. Adela Lank.  72 year old female with right lower quadrant pain for 24 hours.  CT showing appendicitis.  General surgery has been consulted.  Anticipate going to the OR directly from here.  Awaiting confirmation from surgery which Is patient going to.  Antibiotics have been given. Physical Exam  BP 100/60   Pulse 62   Temp 97.8 F (36.6 C) (Oral)   Resp 15   SpO2 94%   Physical Exam  Procedures  Procedures  ED Course / MDM    Medical Decision Making Amount and/or Complexity of Data Reviewed Labs: ordered. Radiology: ordered.  Risk Prescription drug management.   7:30 AM.  Received secure message from surgery PA Earl Gala that patient can go to [employer] [employer] area.       Terrilee Files, MD 06/09/23 [phone]

## 2023-06-09 NOTE — Op Note (Signed)
Date: 06/09/23  Patient: Tonya Ingram MRN: [id number]  Preoperative Diagnosis: Acute appendicitis Postoperative Diagnosis: Same  Procedure: Laparoscopic appendectomy  Surgeon: Sophronia Simas, MD  EBL: Minimal  Anesthesia: General endotracheal  Specimens: Appendix  Indications: Ms. Deschner is a 72 yo female who presented to the ED with 2-3 days of worsening RLQ abdominal pain. A CT scan confirmed acute appendicitis without abscess. After a discussion of the risks and benefits of surgery, she agreed to proceed with appendectomy.  Findings: Acute appendicitis without perforation or abscess.  Procedure details: Informed consent was obtained in the preoperative area prior to the procedure. The patient was brought to the operating room and placed on the table in the supine position. General anesthesia was induced and appropriate lines and drains were placed for intraoperative monitoring. Perioperative antibiotics were administered per SCIP guidelines. The abdomen was prepped and draped in the usual sterile fashion. A pre-procedure timeout was taken verifying patient identity, surgical site and procedure to be performed.  A small infraumbilical skin incision was made and the subcutaneous tissue was spread to expose the fascia. The umbilical stalk was grasped and elevated, and the fascia was sharply incised. The peritoneal cavity was visualized and a 12mm Hasson trocar was inserted. The abdomen was insufflated and inspected with no evidence of visceral or vascular injury. A suprapubic 5mm port was placed, followed by a 5mm port in the LLQ, both under direct visualization. There was inflammation in the RLQ, and cecum and terminal ileum were mildly adherent to the right pelvic sidewall. These adhesions were bluntly taken down to mobilize the cecum and TI medially. This exposed the appendix, which was adherent to the pelvic sidewall. The appendix was bluntly separated from the pelvic sidewall. The  appendix was significantly dilated and inflamed, but there was no evidence of perforation ar abscess. The cecum was partially mobilized along the line of Toldt using harmonic shears. The mesoappendix was divided with the harmonic.  The appendix was then divided at the base from the cecum using a 45mm stapler with a blue load. The specimen was placed in an endocatch bag. The cecal staple line was inspected and appeared in tact with no bleeding or leakage. The ports were removed and the pneumoperitoneum was evacuated. The specimen was extracted via the umbilical port site and sent for routine pathology. The umbilical port site fascia was closed with an 0 Vicryl pursestring suture. The skin at all port sites was closed with 4-0 monocryl subcuticular suture. Dermabond was applied.  The patient tolerated the procedure with no apparent complications. All counts were correct x2 at the end of the procedure. The patient was extubated and taken to PACU in stable condition.  Sophronia Simas, MD 06/09/23 12:11 PM

## 2023-06-09 NOTE — Anesthesia Postprocedure Evaluation (Signed)
Anesthesia Post Note  Patient: Tonya Ingram  Procedure(s) Performed: APPENDECTOMY LAPAROSCOPIC (Abdomen)     Patient location during evaluation: PACU Anesthesia Type: General Level of consciousness: awake and alert Pain management: pain level controlled Vital Signs Assessment: post-procedure vital signs reviewed and stable Respiratory status: spontaneous breathing, nonlabored ventilation, respiratory function stable and patient connected to nasal cannula oxygen Cardiovascular status: blood pressure returned to baseline and stable Postop Assessment: no apparent nausea or vomiting Anesthetic complications: no   No notable events documented.  Last Vitals:  Vitals:   06/09/23 1245 06/09/23 1252  BP: 110/65 120/64  Pulse: 63 65  Resp: 13 15  Temp: 36.6 C 36.6 C  SpO2: 95% 94%    Last Pain:  Vitals:   06/09/23 1252  TempSrc:   PainSc: 3                  Trevor Iha

## 2023-06-09 NOTE — H&P (Signed)
Patric Christakos September 07, 1950  [id number].     HPI:  Ms. Adolphus is a 72 yo female who presented to the ED last night with acute abdominal pain. The pain started about 3 days ago, and has been getting worse since then. It is focused in the RLQ. She denies nausea, vomiting, fevers and diarrhea. Labs were unremarkable. A CT scan showed acute appendicitis. General surgery was consulted and the patient was transferred to [facility] for surgery.  She has previously had a tubal ligation but no other abdominal surgeries.  ROS: Review of Systems  Constitutional:  Negative for chills and fever.  Respiratory:  Negative for shortness of breath.   Gastrointestinal:  Positive for abdominal pain. Negative for diarrhea, nausea and vomiting.    Family History  Problem Relation Age of Onset   Parkinson's disease Mother    Arthritis Mother    Prostate cancer Father    Hypertension Father     Past Medical History:  Diagnosis Date   Arthritis    Depression    GERD (gastroesophageal reflux disease)    Heart murmur    never has caused problems   HSV infection    valtrex   Hypothyroidism    Raynaud's disease     Past Surgical History:  Procedure Laterality Date   AMPUTATION Right 01/17/2022   Procedure: Right index finger partial amputation;  Surgeon: Gomez Cleverly, MD;  Location: [facility] OR;  Service: Orthopedics;  Laterality: Right;  with local anesthesia   THYROIDECTOMY     TOTAL HIP ARTHROPLASTY Left 06/30/2020   Procedure: LEFT TOTAL HIP ARTHROPLASTY ANTERIOR APPROACH;  Surgeon: Kathryne Hitch, MD;  Location: [facility] ORS;  Service: Orthopedics;  Laterality: Left;   TUBAL LIGATION     WISDOM TOOTH EXTRACTION      Social History:  reports that she has never smoked. She has never used smokeless tobacco. She reports that she does not drink alcohol and does not use drugs.  Allergies:  Allergies  Allergen Reactions   Codeine Nausea And Vomiting    Medications Prior to Admission  Medication Sig  Dispense Refill   acetaminophen (TYLENOL) 650 MG CR tablet Take 1,300 mg by mouth daily.     aspirin EC 81 MG tablet 1 tablet Orally Once a day for 30 days     Calcium Carb-Cholecalciferol (CALCIUM + VITAMIN D3 PO) Take 1 tablet by mouth daily.     cetirizine (ZYRTEC) 10 MG tablet Take 10 mg by mouth daily.     escitalopram (LEXAPRO) 10 MG tablet Take 10 mg by mouth daily.     levothyroxine (SYNTHROID) 50 MCG tablet Take 50 mcg by mouth daily before breakfast.     MAGNESIUM PO Take 400 mg by mouth daily.     meclizine (ANTIVERT) 25 MG tablet Take 25 mg by mouth 3 (three) times daily as needed for dizziness (vertigo).     meloxicam (MOBIC) 15 MG tablet Take 1 tablet (15 mg total) by mouth daily as needed for pain. Take with food. 30 tablet 1   nitroGLYCERIN (NITRODUR - DOSED IN MG/24 HR) 0.2 mg/hr patch Apply 1/4th patch to affected hip, change daily 30 patch 1   rosuvastatin (CRESTOR) 10 MG tablet Take 1 tablet (10 mg total) by mouth daily. 90 tablet 3   valACYclovir (VALTREX) 500 MG tablet Take 500 mg by mouth daily.       Physical Exam: Blood pressure 123/65, pulse 61, temperature 98.2 F (36.8 C), temperature source Oral, resp. rate 18, height  5\' 5"  (1.651 m), weight 56.7 kg, SpO2 96%. General: resting comfortably, appears stated age, no apparent distress Neurological: alert and oriented, no focal deficits HEENT: normocephalic, atraumatic,  Respiratory: normal work of breathing on room air Abdomen: soft, nondistended, focally tender to palpation in the RLQ. No masses or organomegaly. Extremities: warm and well-perfused, no deformities, moving all extremities spontaneously Psychiatric: normal mood and affect Skin: warm and dry, no jaundice, no rashes or lesions   Results for orders placed or performed during the hospital encounter of 06/08/23 (from the past 48 hours)  Lipase, blood     Status: None   Collection Time: 06/08/23 10:20 PM  Result Value Ref Range   Lipase 29 11 - 51 U/L     Comment: Performed at [facility], [address] [place], [place] [address]  Comprehensive metabolic panel     Status: Abnormal   Collection Time: 06/08/23 10:20 PM  Result Value Ref Range   Sodium 135 135 - 145 mmol/L   Potassium 4.3 3.5 - 5.1 mmol/L   Chloride 104 98 - 111 mmol/L   CO2 24 22 - 32 mmol/L   Glucose, Bld 101 (H) 70 - 99 mg/dL    Comment: Glucose reference range applies only to samples taken after fasting for at least 8 hours.   BUN 28 (H) 8 - 23 mg/dL   Creatinine, Ser 7.82 0.44 - 1.00 mg/dL   Calcium 9.1 8.9 - 95.6 mg/dL   Total Protein 6.9 6.5 - 8.1 g/dL   Albumin 3.9 3.5 - 5.0 g/dL   AST 25 15 - 41 U/L   ALT 19 0 - 44 U/L   Alkaline Phosphatase 57 38 - 126 U/L   Total Bilirubin 0.5 <1.2 mg/dL   GFR, Estimated >21 >30 mL/min    Comment: (NOTE) Calculated using the CKD-EPI Creatinine Equation (2021)    Anion gap 7 5 - 15    Comment: Performed at [facility], [address] [place], [place] [address]  CBC     Status: Abnormal   Collection Time: 06/08/23 10:20 PM  Result Value Ref Range   WBC 7.3 4.0 - 10.5 K/uL   RBC 3.78 (L) 3.87 - 5.11 MIL/uL   Hemoglobin 11.6 (L) 12.0 - 15.0 g/dL   HCT 46.9 (L) 62.9 - 52.8 %   MCV 93.7 80.0 - 100.0 fL   MCH 30.7 26.0 - 34.0 pg   MCHC 32.8 30.0 - 36.0 g/dL   RDW 41.3 24.4 - 01.0 %   Platelets 259 150 - 400 K/uL   nRBC 0.0 0.0 - 0.2 %    Comment: Performed at [facility], [address] [place], [place] [address]  Urinalysis, Routine w reflex microscopic -Urine, Clean Catch     Status: Abnormal   Collection Time: 06/09/23  7:22 AM  Result Value Ref Range   Color, Urine YELLOW YELLOW   APPearance CLEAR CLEAR   Specific Gravity, Urine 1.010 1.005 - 1.030   pH 7.0 5.0 - 8.0   Glucose, UA NEGATIVE NEGATIVE mg/dL   Hgb urine dipstick TRACE (A) NEGATIVE   Bilirubin Urine NEGATIVE NEGATIVE   Ketones, ur NEGATIVE NEGATIVE mg/dL   Protein, ur NEGATIVE NEGATIVE mg/dL   Nitrite  NEGATIVE NEGATIVE   Leukocytes,Ua TRACE (A) NEGATIVE    Comment: Performed at [facility], [address] [place], [place] [address]  Urinalysis, Microscopic (reflex)     Status: Abnormal  Collection Time: 06/09/23  7:22 AM  Result Value Ref Range   RBC / HPF 0-5 0 - 5 RBC/hpf   WBC, UA 0-5 0 - 5 WBC/hpf   Bacteria, UA RARE (A) NONE SEEN   Squamous Epithelial / HPF 0-5 0 - 5 /HPF    Comment: Performed at [facility], [address] [place], [place] [address]   CT ABDOMEN PELVIS W CONTRAST Result Date: 06/09/2023 CLINICAL DATA:  Right lower quadrant abdominal pain EXAM: CT ABDOMEN AND PELVIS WITH CONTRAST TECHNIQUE: Multidetector CT imaging of the abdomen and pelvis was performed using the standard protocol following bolus administration of intravenous contrast. RADIATION DOSE REDUCTION: This exam was performed according to the departmental dose-optimization program which includes automated exposure control, adjustment of the mA and/or kV according to patient size and/or use of iterative reconstruction technique. CONTRAST:  OMNIPAQUE IOHEXOL 300 MG/ML  SOLN COMPARISON:  PET CT 01/21/2022 FINDINGS: Lower chest: Stable 11 mm subpleural pulmonary nodule within the right costophrenic angle, metabolically negative on a prior PET CT examination and safely considered benign. Calcification of the aortic valve leaflets noted. Global cardiac size within normal limits. Small hiatal hernia. No acute abnormality. Hepatobiliary: No focal liver abnormality is seen. No gallstones, gallbladder wall thickening, or biliary dilatation. Pancreas: Unremarkable Spleen: Unremarkable Adrenals/Urinary Tract: Adrenal glands are unremarkable. Kidneys are normal, without renal calculi, focal lesion, or hydronephrosis. Bladder is unremarkable. Stomach/Bowel: The appendix is hyperemic and there is extensive periappendiceal inflammatory stranding in keeping with changes of acute appendicitis. There is  thickening of the cecal wall adjacent to the base of the appendix related to the adjacent inflammatory process. A small amount of periappendiceal fluid is seen at the base of the appendix adjacent to the cecum, best seen on coronal image # 82/601 and micro perforation is not excluded. No appendicoliths is clearly identified. Small amount of layering fluid is seen within the right paracolic gutter. No free intraperitoneal gas. No evidence of obstruction. Stomach, small bowel, and large bowel are otherwise unremarkable. 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 The abdominal vasculature is otherwise age-appropriate. Mild atherosclerotic calcification within the abdominal aorta. No pathologic adenopathy within the abdomen and pelvis. Reproductive: Status post hysterectomy. No adnexal masses. Other: No abdominal wall hernia Musculoskeletal: Left cave arthroplasty and L3-L5 lumbar fusion with instrumentation and L4 posterior decompression has been performed. No acute bone abnormality. No lytic or blastic bone lesion. IMPRESSION: 1. Acute appendicitis. Small amount of periappendiceal fluid at the base of the appendix adjacent to the cecum, and micro perforation is not excluded. 2. 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 Aortic Atherosclerosis (ICD10-I70.0). Electronically Signed   By: Helyn Numbers M.D.   On: 06/09/2023 00:19      Assessment/Plan 72 yo female presenting with RLQ abdominal pain secondary to acute appendicitis. I personally reviewed her labs, imaging and notes. There may be a contained perforation at the tip of the appendix, but there is no abscess or fluid collection. I recommended proceeding with laparoscopic  appendectomy. The procedure details were reviewed with the patient, including the benefits and risks of bleeding, infection and damage to surrounding structures. She expressed understanding and agrees to proceed with surgery. Plan for discharge home from PACU if no  evidence of perforation. All questions were answered.  Sophronia Simas, MD [facility] Surgery General, Hepatobiliary and Pancreatic Surgery 06/09/23 10:14 AM

## 2023-06-09 NOTE — ED Notes (Signed)
Introduced myself to pt, spoke to pt about going via [facility] to  get her appy out , pt concurs it would be safer

## 2023-06-09 NOTE — Discharge Instructions (Signed)
[name] DISCHARGE INSTRUCTIONS  Activity No heavy lifting greater than 15 pounds for 4 weeks after surgery. Ok to shower in 24 hours, but do not bathe or submerge incisions underwater. Do not drive while taking narcotic pain medication. You may drive when you are no longer taking prescription pain medication, you can comfortably wear a seatbelt, and you can safely maneuver your car and apply brakes.  Wound Care Your incisions are covered with skin glue called Dermabond. This will peel off on its own over time. You may shower and allow warm soapy water to run over your incisions. Gently pat dry. Do not submerge your incision underwater until cleared by your surgeon. Monitor your incision for any new redness, tenderness, or drainage. Many patients will experience some swelling and bruising at the incisions.  Ice packs will help.  Swelling and bruising can take several days to resolve.   Medications A  prescription for pain medication may be given to you upon discharge.  Take your pain medication as prescribed, if needed.  If narcotic pain medicine is not needed, then you may take acetaminophen (Tylenol) or ibuprofen (Advil) as needed. It is common to experience some constipation if taking pain medication after surgery.  Increasing fluid intake and taking a stool softener (such as Colace) will usually help or prevent this problem from occurring.  A mild laxative (Milk of Magnesia or Miralax) should be taken according to package directions if there are no bowel movements after 48 hours. Take your usually prescribed medications unless otherwise directed. If you need a refill on your pain medication, please contact your pharmacy.  They will contact our office to request authorization. Prescriptions will not be filled after 5 pm or on weekends.  When to Call us: Fever greater than 100.5 New redness, drainage, or swelling at incision site Severe pain, nausea, or  vomiting Persistent bleeding from incisions  Follow-up You will have a postoperative appointment scheduled in about 1 month. This will be at the [facility] office at [address], [place], [place]. Please arrive at least 15 minutes prior to your scheduled appointment time.  IF YOU HAVE DISABILITY OR FAMILY LEAVE FORMS, YOU MUST BRING THEM TO THE OFFICE FOR PROCESSING.   DO NOT GIVE THEM TO YOUR DOCTOR.  The clinic staff is available to answer your questions during regular business hours.  Please don't hesitate to call and ask to speak to one of the nurses for clinical concerns.  If you have a medical emergency, go to the nearest emergency room or call 911.  A surgeon from [facility] is always on call at the hospital  [address], [place], [place]  [address] ?  [address] [address], [place], [place]   [address] [phone] ? Toll Free: [phone] ? FAX [phone] Web site: [email or web address]      Managing Your Pain After Surgery Without Opioids    Thank you for participating in our program to help patients manage their pain after surgery without opioids. This is part of our effort to provide you with the best care possible, without exposing you or your family to the risk that opioids pose.  What pain can I expect after surgery? You can expect to have some pain after surgery. This is normal. The pain is typically worse the day after surgery, and quickly begins to get better. Many studies have found that many patients are able to manage their pain after surgery with  Over-the-Counter (OTC) medications such as Tylenol and Motrin. If you have a condition that does not allow you to take Tylenol or Motrin, notify your surgical team.  How will I manage my pain? The best strategy for controlling your pain after surgery is around the clock pain control with Tylenol (acetaminophen) and Motrin (ibuprofen or Advil). Alternating  these medications with each other allows you to maximize your pain control. In addition to Tylenol and Motrin, you can use heating pads or ice packs on your incisions to help reduce your pain.  How will I alternate your regular strength over-the-counter pain medication? You will take a dose of pain medication every three hours. Start by taking 650 mg of Tylenol (2 pills of 325 mg) 3 hours later take 600 mg of Motrin (3 pills of 200 mg) 3 hours after taking the Motrin take 650 mg of Tylenol 3 hours after that take 600 mg of Motrin.   - 1 -  See example - if your first dose of Tylenol is at 12:00 PM   12:00 PM Tylenol 650 mg (2 pills of 325 mg)  3:00 PM Motrin 600 mg (3 pills of 200 mg)  6:00 PM Tylenol 650 mg (2 pills of 325 mg)  9:00 PM Motrin 600 mg (3 pills of 200 mg)  Continue alternating every 3 hours   We recommend that you follow this schedule around-the-clock for at least 3 days after surgery, or until you feel that it is no longer needed. Use the table on the last page of this handout to keep track of the medications you are taking. Important: Do not take more than 3000mg  of Tylenol or 3200mg  of Motrin in a 24-hour period. Do not take ibuprofen/Motrin if you have a history of bleeding stomach ulcers, severe kidney disease, &/or actively taking a blood thinner  What if I still have pain? If you have pain that is not controlled with the over-the-counter pain medications (Tylenol and Motrin or Advil) you might have what we call "breakthrough" pain. You will receive a prescription for a small amount of an opioid pain medication such as Oxycodone, Tramadol, or Tylenol with Codeine. Use these opioid pills in the first 24 hours after surgery if you have breakthrough pain. Do not take more than 1 pill every 4-6 hours.  If you still have uncontrolled pain after using all opioid pills, don't hesitate to call our staff using the number provided. We will help make sure you are managing your  pain in the best way possible, and if necessary, we can provide a prescription for additional pain medication.   Day 1    Time  Name of Medication Number of pills taken  Amount of Acetaminophen  Pain Level   Comments  AM PM       AM PM       AM PM       AM PM       AM PM       AM PM       AM PM       AM PM       Total Daily amount of Acetaminophen Do not take more than  3,000 mg per day      Day 2    Time  Name of Medication Number of pills taken  Amount of Acetaminophen  Pain Level   Comments  AM PM       AM PM       AM PM  AM PM       AM PM       AM PM       AM PM       AM PM       Total Daily amount of Acetaminophen Do not take more than  3,000 mg per day      Day 3    Time  Name of Medication Number of pills taken  Amount of Acetaminophen  Pain Level   Comments  AM PM       AM PM       AM PM       AM PM         AM PM       AM PM       AM PM       AM PM       Total Daily amount of Acetaminophen Do not take more than  3,000 mg per day      Day 4    Time  Name of Medication Number of pills taken  Amount of Acetaminophen  Pain Level   Comments  AM PM       AM PM       AM PM       AM PM       AM PM       AM PM       AM PM       AM PM       Total Daily amount of Acetaminophen Do not take more than  3,000 mg per day      Day 5    Time  Name of Medication Number of pills taken  Amount of Acetaminophen  Pain Level   Comments  AM PM       AM PM       AM PM       AM PM       AM PM       AM PM       AM PM       AM PM       Total Daily amount of Acetaminophen Do not take more than  3,000 mg per day      Day 6    Time  Name of Medication Number of pills taken  Amount of Acetaminophen  Pain Level  Comments  AM PM       AM PM       AM PM       AM PM       AM PM       AM PM       AM PM       AM PM       Total Daily amount of Acetaminophen Do not take more than  3,000 mg per day      Day 7    Time   Name of Medication Number of pills taken  Amount of Acetaminophen  Pain Level   Comments  AM PM       AM PM       AM PM       AM PM       AM PM       AM PM       AM PM       AM PM       Total Daily amount of Acetaminophen Do not take more than  3,000 mg  per day        For additional information about how and where to safely dispose of unused opioid medications - [email or web address]  Disclaimer: This document contains information and/or instructional materials adapted from [facility] for the typical patient with your condition. It does not replace medical advice from your health care provider because your experience may differ from that of the typical patient. Talk to your health care provider if you have any questions about this document, your condition or your treatment plan. Adapted from [facility]

## 2023-06-09 NOTE — Anesthesia Preprocedure Evaluation (Addendum)
Anesthesia Evaluation  Patient identified by MRN, date of birth, ID band Patient awake    Reviewed: Allergy & Precautions, NPO status , Patient's Chart, lab work & pertinent test results  Airway Mallampati: II  TM Distance: >3 FB Neck ROM: Full    Dental no notable dental hx. (+) Teeth Intact, Dental Advisory Given   Pulmonary neg pulmonary ROS   Pulmonary exam normal breath sounds clear to auscultation       Cardiovascular + CAD and + Peripheral Vascular Disease  Normal cardiovascular exam+ Valvular Problems/Murmurs AS  Rhythm:Regular Rate:Normal  January 27, 2023 1. Left ventricular ejection fraction, by estimation, is 65 to 70%. The  left ventricle has normal function. The left ventricle has no regional  wall motion abnormalities. Left ventricular diastolic parameters were  normal.   2. Right ventricular systolic function is normal. The right ventricular  size is normal. There is normal pulmonary artery systolic pressure. The  estimated right ventricular systolic pressure is 22.9 mmHg.   3. Left atrial size was moderately dilated.   4. The mitral valve is grossly normal. Trivial mitral valve  regurgitation.   5. The aortic valve is tricuspid. Aortic valve regurgitation is not  visualized. Moderate aortic valve stenosis. Aortic valve area, by VTI  measures 1.20 cm. Aortic valve mean gradient measures 17.0 mmHg. Aortic  valve Vmax measures 2.68 m/s. Peak  gradient 28.7 mmHg, DI is 0.42.   6. The inferior vena cava is normal in size with greater than 50%  respiratory variability, suggesting right atrial pressure of 3 mmHg.      Neuro/Psych  PSYCHIATRIC DISORDERS  Depression    negative neurological ROS     GI/Hepatic ,GERD  Medicated,,  Endo/Other  Hypothyroidism    Renal/GU Lab Results      Component                Value               Date                          K                        4.3                  06/08/2023                CO2                      24                  06/08/2023                BUN                      28 (H)              06/08/2023                CREATININE               0.61                06/08/2023                 Musculoskeletal  (+) Arthritis , Osteoarthritis,    Abdominal   Peds  Hematology Lab Results  Component                Value               Date                      WBC                      7.3                 06/08/2023                HGB                      11.6 (L)            06/08/2023                HCT                      35.4 (L)            06/08/2023                MCV                      93.7                06/08/2023                PLT                      259                 06/08/2023              Anesthesia Other Findings All: Codeine  Reproductive/Obstetrics                             Anesthesia Physical Anesthesia Plan  ASA: 2  Anesthesia Plan: General   Post-op Pain Management: Tylenol PO (pre-op)* and Precedex   Induction: Intravenous  PONV Risk Score and Plan: 3 and Treatment may vary due to age or medical condition and Ondansetron  Airway Management Planned: Oral ETT  Additional Equipment: None  Intra-op Plan:   Post-operative Plan: Extubation in OR  Informed Consent: I have reviewed the patients History and Physical, chart, labs and discussed the procedure including the risks, benefits and alternatives for the proposed anesthesia with the patient or authorized representative who has indicated his/her understanding and acceptance.     Dental advisory given  Plan Discussed with: CRNA and Anesthesiologist  Anesthesia Plan Comments:        Anesthesia Quick Evaluation

## 2023-06-09 NOTE — Anesthesia Procedure Notes (Signed)
Procedure Name: Intubation Date/Time: 06/09/2023 11:10 AM  Performed by: Elisabeth Cara, CRNAPre-anesthesia Checklist: Patient identified, Emergency Drugs available, Suction available, Patient being monitored and Timeout performed Patient Re-evaluated:Patient Re-evaluated prior to induction Oxygen Delivery Method: Circle system utilized Preoxygenation: Pre-oxygenation with 100% oxygen Induction Type: IV induction Ventilation: Mask ventilation without difficulty Laryngoscope Size: Mac and 4 Grade View: Grade I Tube type: Oral Tube size: 7.5 mm Number of attempts: 1 Airway Equipment and Method: Stylet Placement Confirmation: ETT inserted through vocal cords under direct vision, positive ETCO2 and breath sounds checked- equal and bilateral Secured at: 21 cm Tube secured with: Tape Dental Injury: Teeth and Oropharynx as per pre-operative assessment

## 2023-06-09 NOTE — Transfer of Care (Signed)
Immediate Anesthesia Transfer of Care Note  Patient: Tonya Ingram  Procedure(s) Performed: APPENDECTOMY LAPAROSCOPIC (Abdomen)  Patient Location: PACU  Anesthesia Type:General  Level of Consciousness: awake, alert , oriented, and patient cooperative  Airway & Oxygen Therapy: Patient Spontanous Breathing and Patient connected to face mask oxygen  Post-op Assessment: Report given to RN, Post -op Vital signs reviewed and stable, and Patient moving all extremities  Post vital signs: Reviewed and stable  Last Vitals:  Vitals Value Taken Time  BP 137/64 06/09/23 1207  Temp    Pulse 73 06/09/23 1207  Resp 15 06/09/23 1208  SpO2 100 % 06/09/23 1207  Vitals shown include unfiled device data.  Last Pain:  Vitals:   06/09/23 0915  TempSrc: Oral  PainSc:       Patients Stated Pain Goal: 3 (06/09/23 0911)  Complications: No notable events documented.

## 2023-06-10 ENCOUNTER — Encounter (HOSPITAL_COMMUNITY): Payer: Self-pay | Admitting: Surgery

## 2023-06-11 LAB — SURGICAL PATHOLOGY

## 2023-06-13 ENCOUNTER — Other Ambulatory Visit: Payer: Medicare HMO

## 2023-06-20 ENCOUNTER — Other Ambulatory Visit: Payer: Medicare HMO

## 2023-06-27 ENCOUNTER — Other Ambulatory Visit: Payer: Medicare HMO

## 2023-07-01 ENCOUNTER — Ambulatory Visit
Admission: RE | Admit: 2023-07-01 | Discharge: 2023-07-01 | Disposition: A | Discharge status: Home / Self Care | Payer: Medicare HMO | Source: Ambulatory Visit | Attending: Family Medicine | Admitting: Family Medicine

## 2023-07-01 DIAGNOSIS — R911 Solitary pulmonary nodule: Secondary | ICD-10-CM

## 2023-07-01 DIAGNOSIS — I2510 Atherosclerotic heart disease of native coronary artery without angina pectoris: Secondary | ICD-10-CM | POA: Diagnosis not present

## 2023-07-01 DIAGNOSIS — I700 Atherosclerosis of aorta: Secondary | ICD-10-CM | POA: Diagnosis not present

## 2023-07-07 DIAGNOSIS — Z124 Encounter for screening for malignant neoplasm of cervix: Secondary | ICD-10-CM | POA: Diagnosis not present

## 2023-07-07 DIAGNOSIS — Z1231 Encounter for screening mammogram for malignant neoplasm of breast: Secondary | ICD-10-CM | POA: Diagnosis not present

## 2023-07-07 DIAGNOSIS — Z01411 Encounter for gynecological examination (general) (routine) with abnormal findings: Secondary | ICD-10-CM | POA: Diagnosis not present

## 2023-07-07 DIAGNOSIS — Z1331 Encounter for screening for depression: Secondary | ICD-10-CM | POA: Diagnosis not present

## 2023-07-07 DIAGNOSIS — Z01419 Encounter for gynecological examination (general) (routine) without abnormal findings: Secondary | ICD-10-CM | POA: Diagnosis not present

## 2023-07-08 DIAGNOSIS — E782 Mixed hyperlipidemia: Secondary | ICD-10-CM | POA: Diagnosis not present

## 2023-07-08 DIAGNOSIS — I700 Atherosclerosis of aorta: Secondary | ICD-10-CM | POA: Diagnosis not present

## 2023-07-08 DIAGNOSIS — I350 Nonrheumatic aortic (valve) stenosis: Secondary | ICD-10-CM | POA: Diagnosis not present

## 2023-07-08 DIAGNOSIS — I2510 Atherosclerotic heart disease of native coronary artery without angina pectoris: Secondary | ICD-10-CM | POA: Diagnosis not present

## 2023-07-09 ENCOUNTER — Other Ambulatory Visit: Payer: Self-pay | Admitting: Obstetrics

## 2023-07-09 DIAGNOSIS — M8580 Other specified disorders of bone density and structure, unspecified site: Secondary | ICD-10-CM

## 2023-07-09 LAB — ALT: ALT: 12 [IU]/L (ref 0–32)

## 2023-07-09 LAB — LIPOPROTEIN A (LPA): Lipoprotein (a): 33.9 nmol/L (ref <75.0)

## 2023-07-09 LAB — LIPID PANEL
Chol/HDL Ratio: 4.1 {ratio} (ref 0.0–4.4)
Cholesterol, Total: 163 mg/dL (ref 100–199)
HDL: 40 mg/dL (ref >39)
LDL Chol Calc (NIH): 95 mg/dL (ref 0–99)
Triglycerides: 163 mg/dL — ABNORMAL HIGH (ref 0–149)
VLDL Cholesterol Cal: 28 mg/dL (ref 5–40)

## 2023-07-10 DIAGNOSIS — H5203 Hypermetropia, bilateral: Secondary | ICD-10-CM | POA: Diagnosis not present

## 2023-07-10 DIAGNOSIS — H524 Presbyopia: Secondary | ICD-10-CM | POA: Diagnosis not present

## 2023-07-10 DIAGNOSIS — H52223 Regular astigmatism, bilateral: Secondary | ICD-10-CM | POA: Diagnosis not present

## 2023-07-14 ENCOUNTER — Telehealth: Payer: Self-pay

## 2023-07-14 DIAGNOSIS — I2510 Atherosclerotic heart disease of native coronary artery without angina pectoris: Secondary | ICD-10-CM

## 2023-07-14 DIAGNOSIS — I700 Atherosclerosis of aorta: Secondary | ICD-10-CM

## 2023-07-14 DIAGNOSIS — E785 Hyperlipidemia, unspecified: Secondary | ICD-10-CM

## 2023-07-14 MED ORDER — ROSUVASTATIN CALCIUM 20 MG PO TABS: 20.0000 mg | ORAL_TABLET | Freq: Every day | ORAL | 3 refills | Status: DC

## 2023-07-14 NOTE — Telephone Encounter (Signed)
-----   Message from Christell Constant sent at 07/12/2023  8:45 PM EST ----- Regarding: FW: LDL has improved but is still above our goal given her coronary artery calcifications. She eats a very mindful diet and I will offer her rosuvastatin and 20 mg and labs in three months. ----- Message ----- From: Interface, [facility] Lab Results In Sent: 07/08/2023   8:35 PM EST To: Christell Constant, MD

## 2023-07-14 NOTE — Telephone Encounter (Signed)
The patient has been notified of the result and verbalized understanding.  All questions (if any) were answered. Macie Burows, RN 07/14/2023 1:27 PM   Orders placed and released for future collection.

## 2023-07-18 ENCOUNTER — Encounter: Payer: Self-pay | Admitting: Pediatrics

## 2023-07-21 ENCOUNTER — Telehealth: Payer: Self-pay

## 2023-07-21 NOTE — Telephone Encounter (Signed)
Hello Dr. Doy Hutching,   We have this pt scheduled for a direct colonoscopy. Per her ref it is for acellular mucin of appendectomy specimen. Is she ok to proceed as scheduled or does this require an OV first.   Thank you, PV

## 2023-07-29 ENCOUNTER — Ambulatory Visit (AMBULATORY_SURGERY_CENTER): Payer: Medicare HMO

## 2023-07-29 VITALS — Ht 65.0 in | Wt 120.0 lb

## 2023-07-29 DIAGNOSIS — Z1211 Encounter for screening for malignant neoplasm of colon: Secondary | ICD-10-CM

## 2023-07-29 DIAGNOSIS — R4589 Other symptoms and signs involving emotional state: Secondary | ICD-10-CM

## 2023-07-29 MED ORDER — SUFLAVE 178.7 G PO SOLR: 1.0000 | ORAL | 0 refills | Status: DC

## 2023-07-29 NOTE — Progress Notes (Signed)
 No egg or soy allergy known to patient  No issues known to pt with past sedation with any surgeries or procedures Patient denies ever being told they had issues or difficulty with intubation  No FH of Malignant Hyperthermia Pt is not on diet pills Pt is not on  home 02  Pt is not on blood thinners  Pt denies issues with constipation  No A fib or A flutter Have any cardiac testing pending-- no  LOA: independent  Prep: suflave    PV completed with patient. Prep instructions sent via mychart and home address.

## 2023-08-08 ENCOUNTER — Ambulatory Visit: Payer: Medicare HMO | Admitting: Podiatry

## 2023-08-14 ENCOUNTER — Ambulatory Visit: Payer: Medicare HMO | Admitting: Podiatry

## 2023-08-21 ENCOUNTER — Encounter: Payer: Self-pay | Admitting: Podiatry

## 2023-08-21 ENCOUNTER — Ambulatory Visit: Payer: Medicare HMO | Admitting: Podiatry

## 2023-08-21 DIAGNOSIS — L84 Corns and callosities: Secondary | ICD-10-CM

## 2023-08-21 DIAGNOSIS — M2041 Other hammer toe(s) (acquired), right foot: Secondary | ICD-10-CM | POA: Diagnosis not present

## 2023-08-21 DIAGNOSIS — L603 Nail dystrophy: Secondary | ICD-10-CM | POA: Diagnosis not present

## 2023-08-21 NOTE — Progress Notes (Unsigned)
 Chief Complaint  Patient presents with   Callouses    Patient has 3 Callouses that she would like to get cut off they cause her a lot of pain for the past few years. Patient takes tylenol twice a day to help with pain    HPI: 73 y.o. female presents today with complaint of callus to both feet at the fifth metatarsal bases and associated with right second hammertoe.  They become painful weightbearing.  She is also concerned about changes to bilateral great toenails.  She is concerned about possible nail fungus.  She is concerned about the right hallux nail becoming loose.  Past Medical History:  Diagnosis Date   Arthritis    Depression    GERD (gastroesophageal reflux disease)    Heart murmur    never has caused problems   HSV infection    valtrex   Hypothyroidism    Raynaud's disease     Past Surgical History:  Procedure Laterality Date   AMPUTATION Right 01/17/2022   Procedure: Right index finger partial amputation;  Surgeon: Gomez Cleverly, MD;  Location: [facility] OR;  Service: Orthopedics;  Laterality: Right;  with local anesthesia   LAPAROSCOPIC APPENDECTOMY N/A 06/09/2023   Procedure: APPENDECTOMY LAPAROSCOPIC;  Surgeon: Fritzi Mandes, MD;  Location: [facility] ORS;  Service: General;  Laterality: N/A;   THYROIDECTOMY     TOTAL HIP ARTHROPLASTY Left 06/30/2020   Procedure: LEFT TOTAL HIP ARTHROPLASTY ANTERIOR APPROACH;  Surgeon: Kathryne Hitch, MD;  Location: [facility] OR[facility];  Service: Orthopedics;  Laterality: Left;   TUBAL LIGATION     WISDOM TOOTH EXTRACTION      Allergies  Allergen Reactions   Codeine Nausea And Vomiting and Other (See Comments)    ROS denies nausea, vomiting, fever, chills, chest pain, shortness of breath   Physical Exam: There were no vitals filed for this visit.  General: The patient is alert and oriented x3 in no acute distress.  Dermatology: Skin is warm, dry and supple bilateral lower extremities.  Preulcerative callus present left subfifth  metatarsal base and right second hammertoe distal aspect.  Mild callus formation right subfifth met base.  Toenails are thickened, elongated, dystrophic with subungual debris.  The great toenails are most severe.  Some nail plate separation noted to the right hallux.  Vascular: Palpable pedal pulses bilaterally. Capillary refill within normal limits.  No appreciable edema.  No erythema or calor.  Neurological: Light touch sensation grossly intact bilateral feet.   Musculoskeletal Exam: Hammertoe contractures noted bilaterally, semireducible.   Assessment/Plan of Care: 1. Nail dystrophy   2. Hammertoe of right foot   3. Callus      No orders of the defined types were placed in this encounter.  None  Discussed clinical findings with patient today.  # Preulcerative calluses -All symptomatic hyperkeratoses were safely debrided as a courtesy with a sterile #312 blade to patient's level of comfort without incident. We discussed preventative and palliative care of these lesions including supportive and accommodative shoegear, padding, prefabricated and custom molded accommodative orthoses, use of a pumice stone and lotions/creams daily.  Offloading padding applied -Instructed patient to apply topical urea cream to the calluses daily.  # Nail dystrophy -Concern for onychomycosis most notably of the great toenails bilaterally. -Nail specimen for bilateral great toenails were sent off for fungal pathology -Follow-up in approximately 3 weeks to discuss pathology results.  Did discuss treatment with medication versus removal of the nails.  Patient states she would  likely want at least one of the toenails removed.  # Hammertoe contractures -Contributing to the callus formation left second toe distal aspect. -Discussed padding and shoe gear modification -radiographs at follow-up    Uchechi Denison L. Marchia Bond, AACFAS [facility]     [address] [place], [place] [address]                Office [phone]  Fax [phone]

## 2023-08-21 NOTE — Patient Instructions (Signed)
 Look for urea 40% cream or ointment and apply to the thickened dry skin / calluses. This can be bought over the counter, at a pharmacy or online such as [employer].  Can also look for combination product with salicylic acid.  Lower concentrations of over-the-counter creams with urea include AmLactin or Lac-Hydrin.  He also scrub these areas with white vinegar prior to showering.  More silicone and pads can be purchased from:  [email or web address]

## 2023-08-22 ENCOUNTER — Encounter: Payer: Self-pay | Admitting: Pediatrics

## 2023-08-25 ENCOUNTER — Encounter: Payer: Self-pay | Admitting: Pediatrics

## 2023-08-25 MED ORDER — SUFLAVE 178.7 G PO SOLR: 1.0000 | ORAL | 0 refills | Status: DC

## 2023-08-25 NOTE — Addendum Note (Signed)
 Addended by: Darylene Price on: 08/25/2023 10:36 AM   Modules accepted: Orders

## 2023-08-25 NOTE — Progress Notes (Unsigned)
 [facility] History and Physical   Primary Care Physician:  Aliene Beams, MD   Reason for Procedure:  Colon cancer screening, follow-up appendectomy  Plan:    Colonoscopy   HPI: Tonya Ingram is a 73 y.o. female undergoing for screening colonoscopy-no prior history of colonoscopy or stool based testing.  Patient has a pertinent history of undergoing laparoscopic appendectomy in December 2024.  Pathology from that study showed acute, subacute and chronic fibropurulent serositis with associated acellular mucin.  No epithelial atypia.  No source of the mucin was clearly evident in the specimen.  She was advised to undergo colonoscopy to exclude a mucin producing tumor in the colon.  Patient denies symptoms of change in bowel habits or rectal bleeding.  Past Medical History:  Diagnosis Date   Arthritis    Depression    GERD (gastroesophageal reflux disease)    Heart murmur    never has caused problems   HSV infection    valtrex   Hypothyroidism    Raynaud's disease     Past Surgical History:  Procedure Laterality Date   AMPUTATION Right 01/17/2022   Procedure: Right index finger partial amputation;  Surgeon: Gomez Cleverly, MD;  Location: [facility] OR;  Service: Orthopedics;  Laterality: Right;  with local anesthesia   LAPAROSCOPIC APPENDECTOMY N/A 06/09/2023   Procedure: APPENDECTOMY LAPAROSCOPIC;  Surgeon: Fritzi Mandes, MD;  Location: [facility]S;  Service: General;  Laterality: N/A;   THYROIDECTOMY     TOTAL HIP ARTHROPLASTY Left 06/30/2020   Procedure: LEFT TOTAL HIP ARTHROPLASTY ANTERIOR APPROACH;  Surgeon: Kathryne Hitch, MD;  Location: [facility];  Service: Orthopedics;  Laterality: Left;   TUBAL LIGATION     WISDOM TOOTH EXTRACTION      Prior to Admission medications   Medication Sig Start Date End Date Taking? Authorizing Provider  acetaminophen (TYLENOL) 650 MG CR tablet Take 1,300 mg by mouth daily.    [provider]  aspirin EC 81 MG tablet 1  tablet Orally Once a day for 30 days 10/10/22   [provider]  Calcium Carb-Cholecalciferol (CALCIUM + VITAMIN D3 PO) Take 1 tablet by mouth daily.    [provider]  cetirizine (ZYRTEC) 10 MG tablet Take 10 mg by mouth daily.    [provider]  escitalopram (LEXAPRO) 10 MG tablet Take 10 mg by mouth 2 (two) times a week.    [provider]  levothyroxine (SYNTHROID) 50 MCG tablet Take 50 mcg by mouth daily before breakfast.    [provider]  MAGNESIUM PO Take 400 mg by mouth daily.    [provider]  meclizine (ANTIVERT) 25 MG tablet Take 25 mg by mouth 3 (three) times daily as needed for dizziness (vertigo).    [provider]  meloxicam (MOBIC) 15 MG tablet Take 1 tablet (15 mg total) by mouth daily as needed for pain. Take with food. 08/28/22   Ralene Cork, DO  nitroGLYCERIN (NITRODUR - DOSED IN MG/24 HR) 0.2 mg/hr patch Apply 1/4th patch to affected hip, change daily Patient taking differently: Apply 1/2th patch to affected hip, change daily 05/06/23   Hudnall, Azucena Fallen, MD  PEG 3350-KCl-NaCl-NaSulf-MgSul (SUFLAVE) 178.7 g SOLR Take 1 kit by mouth as directed. 07/29/23   Ottie Glazier, MD  rosuvastatin (CRESTOR) 20 MG tablet Take 1 tablet (20 mg total) by mouth daily. 07/14/23   Chandrasekhar, Rondel Jumbo, MD  valACYclovir (VALTREX) 500 MG tablet Take 500 mg by mouth daily. 12/01/21   [provider]    Current Outpatient Medications  Medication Sig Dispense Refill   nitroGLYCERIN (NITRODUR - DOSED IN MG/24 HR) 0.2 mg/hr patch Apply 1/4th patch to affected hip, change daily (Patient taking differently: Apply 1/2th patch to affected hip, change daily) 30 patch 1   acetaminophen (TYLENOL) 650 MG CR tablet Take 1,300 mg by mouth daily.     aspirin EC 81 MG tablet 1 tablet Orally Once a day for 30 days     Calcium Carb-Cholecalciferol (CALCIUM + VITAMIN D3 PO) Take 1 tablet by mouth daily.     cetirizine (ZYRTEC) 10 MG  tablet Take 10 mg by mouth daily.     escitalopram (LEXAPRO) 10 MG tablet Take 10 mg by mouth 2 (two) times a week.     levothyroxine (SYNTHROID) 50 MCG tablet Take 50 mcg by mouth daily before breakfast.     MAGNESIUM PO Take 400 mg by mouth daily.     meclizine (ANTIVERT) 25 MG tablet Take 25 mg by mouth 3 (three) times daily as needed for dizziness (vertigo).     meloxicam (MOBIC) 15 MG tablet Take 1 tablet (15 mg total) by mouth daily as needed for pain. Take with food. 30 tablet 1   rosuvastatin (CRESTOR) 20 MG tablet Take 1 tablet (20 mg total) by mouth daily. 90 tablet 3   valACYclovir (VALTREX) 500 MG tablet Take 500 mg by mouth daily.     Current Facility-Administered Medications  Medication Dose Route Frequency Provider Last Rate Last Admin   0.9 %  sodium chloride infusion  500 mL Intravenous Once Alphons Burgert, Durene Romans, MD        Allergies as of 08/26/2023 - Review Complete 08/26/2023  Allergen Reaction Noted   Codeine Nausea And Vomiting and Other (See Comments) 12/01/2019    Family History  Problem Relation Age of Onset   Parkinson's disease Mother    Arthritis Mother    Prostate cancer Father    Hypertension Father    Colon cancer Neg Hx    Rectal cancer Neg Hx    Stomach cancer Neg Hx    Esophageal cancer Neg Hx     Social History   Socioeconomic History   Marital status: Divorced    Spouse name: Not on file   Number of children: Not on file   Years of education: Not on file   Highest education level: Not on file  Occupational History   Not on file  Tobacco Use   Smoking status: Never   Smokeless tobacco: Never  Vaping Use   Vaping status: Never Used  Substance and Sexual Activity   Alcohol use: Never   Drug use: Never   Sexual activity: Yes    Birth control/protection: Surgical    Comment: tubal ligation  Other Topics Concern   Not on file  Social History Narrative   Not on file   Social Drivers of Health   Financial Resource Strain: Not on file   Food Insecurity: Not on file  Transportation Needs: Not on file  Physical Activity: Not on file  Stress: Not on file  Social Connections: Not on file  Intimate Partner Violence: Not on file    Review of Systems:  All other review of systems negative except as mentioned in the HPI.  Physical Exam: Vital signs BP (!) 140/75   Pulse (!) 59   Temp (!) 97.2 F (36.2 C)   Ht 5\' 5"  (1.651 m)   Wt 120 lb (54.4 kg)  SpO2 97%   BMI 19.97 kg/m   General:   Alert,  Well-developed, well-nourished, pleasant and cooperative in NAD Airway:  Mallampati 2 Lungs:  Clear throughout to auscultation.   Heart:  Regular rate and rhythm; 2/6 systolic murmur, clicks, rubs,  or gallops. Abdomen:  Soft, nontender and nondistended. Normal bowel sounds.   Neuro/Psych:  Normal mood and affect. A and O x 3  Maren Beach, MD [facility]

## 2023-08-26 ENCOUNTER — Ambulatory Visit (AMBULATORY_SURGERY_CENTER): Payer: Medicare HMO | Admitting: Pediatrics

## 2023-08-26 ENCOUNTER — Encounter: Payer: Self-pay | Admitting: Pediatrics

## 2023-08-26 VITALS — BP 139/73 | HR 61 | Temp 97.2°F | Resp 14 | Ht 65.0 in | Wt 120.0 lb

## 2023-08-26 DIAGNOSIS — Z1211 Encounter for screening for malignant neoplasm of colon: Secondary | ICD-10-CM

## 2023-08-26 DIAGNOSIS — E039 Hypothyroidism, unspecified: Secondary | ICD-10-CM | POA: Diagnosis not present

## 2023-08-26 DIAGNOSIS — K5730 Diverticulosis of large intestine without perforation or abscess without bleeding: Secondary | ICD-10-CM

## 2023-08-26 DIAGNOSIS — K648 Other hemorrhoids: Secondary | ICD-10-CM | POA: Diagnosis not present

## 2023-08-26 DIAGNOSIS — F32A Depression, unspecified: Secondary | ICD-10-CM | POA: Diagnosis not present

## 2023-08-26 MED ORDER — SODIUM CHLORIDE 0.9 % IV SOLN: 500.0000 mL | Freq: Once | INTRAVENOUS | Status: DC

## 2023-08-26 NOTE — Patient Instructions (Signed)
 Resume Previous Diet Continue present medications  Await pathology results   YOU HAD AN ENDOSCOPIC PROCEDURE TODAY AT THE [facility]:   Refer to the procedure report that was given to you for any specific questions about what was found during the examination.  If the procedure report does not answer your questions, please call your gastroenterologist to clarify.  If you requested that your care partner not be given the details of your procedure findings, then the procedure report has been included in a sealed envelope for you to review at your convenience later.  YOU SHOULD EXPECT: Some feelings of bloating in the abdomen. Passage of more gas than usual.  Walking can help get rid of the air that was put into your GI tract during the procedure and reduce the bloating. If you had a lower endoscopy (such as a colonoscopy or flexible sigmoidoscopy) you may notice spotting of blood in your stool or on the toilet paper. If you underwent a bowel prep for your procedure, you may not have a normal bowel movement for a few days.  Please Note:  You might notice some irritation and congestion in your nose or some drainage.  This is from the oxygen used during your procedure.  There is no need for concern and it should clear up in a day or so.  SYMPTOMS TO REPORT IMMEDIATELY:  Following lower endoscopy (colonoscopy or flexible sigmoidoscopy):  Excessive amounts of blood in the stool  Significant tenderness or worsening of abdominal pains  Swelling of the abdomen that is new, acute  Fever of 100F or higher  Following upper endoscopy (EGD)  Vomiting of blood or coffee ground material  New chest pain or pain under the shoulder blades  Painful or persistently difficult swallowing  New shortness of breath  Fever of 100F or higher  Black, tarry-looking stools  For urgent or emergent issues, a gastroenterologist can be reached at any hour by calling [phone]. Do not use MyChart messaging  for urgent concerns.    DIET:  We do recommend a small meal at first, but then you may proceed to your regular diet.  Drink plenty of fluids but you should avoid alcoholic beverages for 24 hours.  ACTIVITY:  You should plan to take it easy for the rest of today and you should NOT DRIVE or use heavy machinery until tomorrow (because of the sedation medicines used during the test).    FOLLOW UP: Our staff will call the number listed on your records the next business day following your procedure.  We will call around 7:15- 8:00 am to check on you and address any questions or concerns that you may have regarding the information given to you following your procedure. If we do not reach you, we will leave a message.     If any biopsies were taken you will be contacted by phone or by letter within the next 1-3 weeks.  Please call us at [phone] if you have not heard about the biopsies in 3 weeks.    SIGNATURES/CONFIDENTIALITY: You and/or your care partner have signed paperwork which will be entered into your electronic medical record.  These signatures attest to the fact that that the information above on your After Visit Summary has been reviewed and is understood.  Full responsibility of the confidentiality of this discharge information lies with you and/or your care-partner.YOU HAD AN ENDOSCOPIC PROCEDURE TODAY AT THE [facility]:   Refer to the procedure report that was  given to you for any specific questions about what was found during the examination.  If the procedure report does not answer your questions, please call your gastroenterologist to clarify.  If you requested that your care partner not be given the details of your procedure findings, then the procedure report has been included in a sealed envelope for you to review at your convenience later.  YOU SHOULD EXPECT: Some feelings of bloating in the abdomen. Passage of more gas than usual.  Walking can help get rid of the air  that was put into your GI tract during the procedure and reduce the bloating. If you had a lower endoscopy (such as a colonoscopy or flexible sigmoidoscopy) you may notice spotting of blood in your stool or on the toilet paper. If you underwent a bowel prep for your procedure, you may not have a normal bowel movement for a few days.  Please Note:  You might notice some irritation and congestion in your nose or some drainage.  This is from the oxygen used during your procedure.  There is no need for concern and it should clear up in a day or so.  SYMPTOMS TO REPORT IMMEDIATELY:  Following lower endoscopy (colonoscopy or flexible sigmoidoscopy):  Excessive amounts of blood in the stool  Significant tenderness or worsening of abdominal pains  Swelling of the abdomen that is new, acute  Fever of 100F or higher  Following upper endoscopy (EGD)  Vomiting of blood or coffee ground material  New chest pain or pain under the shoulder blades  Painful or persistently difficult swallowing  New shortness of breath  Fever of 100F or higher  Black, tarry-looking stools  For urgent or emergent issues, a gastroenterologist can be reached at any hour by calling [phone]. Do not use MyChart messaging for urgent concerns.    DIET:  We do recommend a small meal at first, but then you may proceed to your regular diet.  Drink plenty of fluids but you should avoid alcoholic beverages for 24 hours.  ACTIVITY:  You should plan to take it easy for the rest of today and you should NOT DRIVE or use heavy machinery until tomorrow (because of the sedation medicines used during the test).    FOLLOW UP: Our staff will call the number listed on your records the next business day following your procedure.  We will call around 7:15- 8:00 am to check on you and address any questions or concerns that you may have regarding the information given to you following your procedure. If we do not reach you, we will leave a  message.     If any biopsies were taken you will be contacted by phone or by letter within the next 1-3 weeks.  Please call us at [phone] if you have not heard about the biopsies in 3 weeks.    SIGNATURES/CONFIDENTIALITY: You and/or your care partner have signed paperwork which will be entered into your electronic medical record.  These signatures attest to the fact that that the information above on your After Visit Summary has been reviewed and is understood.  Full responsibility of the confidentiality of this discharge information lies with you and/or your care-partner.

## 2023-08-26 NOTE — Progress Notes (Signed)
 Vss nad trans to pacu

## 2023-08-26 NOTE — Op Note (Signed)
 [facility] Patient Name: Tonya Ingram Procedure Date: 08/26/2023 7:09 AM MRN: [id number] Endoscopist: Maren Beach , MD, [id number] Age: 73 Referring MD:  Date of Birth: 07/02/1950 Gender: Female Account #: [id number] Procedure:                Colonoscopy Indications:              Screening for colorectal malignant neoplasm, This                            is the patient's first colonoscopy; history                            appendectomy 02/03/2023. Histopathology specimen                            showing acellular mucin. Colonoscopy is performed                            to exclude mucin producing tumor in the colon. Medicines:                Monitored Anesthesia Care Procedure:                Pre-Anesthesia Assessment:                           - Prior to the procedure, a History and Physical                            was performed, and patient medications and                            allergies were reviewed. The patient's tolerance of                            previous anesthesia was also reviewed. The risks                            and benefits of the procedure and the sedation                            options and risks were discussed with the patient.                            All questions were answered, and informed consent                            was obtained. Prior Anticoagulants: The patient has                            taken no anticoagulant or antiplatelet agents. ASA                            Grade Assessment: II - A patient with mild systemic  disease. After reviewing the risks and benefits,                            the patient was deemed in satisfactory condition to                            undergo the procedure.                           After obtaining informed consent, the colonoscope                            was passed under direct vision. Throughout the                            procedure, the  patient's blood pressure, pulse, and                            oxygen saturations were monitored continuously. The                            PCF-HQ190L Colonoscope [id number] was introduced                            through the anus and advanced to the terminal                            ileum. The colonoscopy was performed without                            difficulty. The patient tolerated the procedure                            well. The quality of the bowel preparation was                            good. The terminal ileum, ileocecal valve,                            appendiceal orifice, and rectum were photographed. Scope In: 8:13:48 AM Scope Out: 8:33:45 AM Scope Withdrawal Time: 0 hours 12 minutes 24 seconds  Total Procedure Duration: 0 hours 19 minutes 57 seconds  Findings:                 The perianal and digital rectal examinations were                            normal. Pertinent negatives include normal                            sphincter tone and no palpable rectal lesions.                           A few small-mouthed diverticula were found in the  sigmoid colon.                           The colon (entire examined portion) appeared normal.                           The terminal ileum appeared normal.                           Internal hemorrhoids were found during retroflexion. Complications:            No immediate complications. Impression:               - Diverticulosis in the sigmoid colon.                           - The entire examined colon is normal.                           - The examined portion of the ileum was normal.                           - Internal hemorrhoids.                           - No specimens collected. Recommendation:           - Discharge patient to home (ambulatory).                           - The findings and recommendations were discussed                            with the patient's family.                            - Return to referring physician.                           - Patient has a contact number available for                            emergencies. The signs and symptoms of potential                            delayed complications were discussed with the                            patient. Return to normal activities tomorrow.                            Written discharge instructions were provided to the                            patient. Maren Beach, MD 08/26/2023 8:39:05 AM This report has been signed electronically.

## 2023-08-26 NOTE — Progress Notes (Signed)
 Pt's states no medical or surgical changes since previsit or office visit.

## 2023-08-27 ENCOUNTER — Telehealth: Payer: Self-pay

## 2023-08-27 NOTE — Telephone Encounter (Signed)
Attempted f/u call. No answer, Left VM.

## 2023-09-01 ENCOUNTER — Other Ambulatory Visit: Payer: Self-pay | Admitting: Podiatry

## 2023-09-10 DIAGNOSIS — H00015 Hordeolum externum left lower eyelid: Secondary | ICD-10-CM | POA: Diagnosis not present

## 2023-09-19 ENCOUNTER — Ambulatory Visit: Payer: Medicare HMO | Admitting: Podiatry

## 2023-09-26 ENCOUNTER — Ambulatory Visit: Admitting: Podiatry

## 2023-09-26 ENCOUNTER — Encounter: Payer: Self-pay | Admitting: Podiatry

## 2023-09-26 DIAGNOSIS — M21611 Bunion of right foot: Secondary | ICD-10-CM

## 2023-09-26 DIAGNOSIS — M2041 Other hammer toe(s) (acquired), right foot: Secondary | ICD-10-CM

## 2023-09-26 DIAGNOSIS — L84 Corns and callosities: Secondary | ICD-10-CM | POA: Diagnosis not present

## 2023-09-26 NOTE — Patient Instructions (Signed)
 You can combine 3 tablespoons of coconut oil and 10-15 drops of tea tree oil together and apply to the toenails once a day.   More silicone pads can be purchased from:  [email or web address].com/[email or web address]/  Look for urea 40% cream or ointment and apply to the thickened dry skin / calluses. This can be bought over the counter, at a pharmacy or online such as [employer].  You can also scrub these areas with white vinegar

## 2023-09-26 NOTE — Progress Notes (Signed)
 Chief Complaint  Patient presents with   Callouses    "I hope he can shave this callus down a little more."    HPI: 73 y.o. female presents today to follow-up and review nail pathology or dystrophic changes to bilateral great toenails.  She has also had recurrence of the bilateral fifth met base calluses and interdigital callus right second toe.  She states that the corn pads were not very helpful.  Past Medical History:  Diagnosis Date   Arthritis    Depression    GERD (gastroesophageal reflux disease)    Heart murmur    never has caused problems   HSV infection    valtrex   Hypothyroidism    Raynaud's disease     Past Surgical History:  Procedure Laterality Date   AMPUTATION Right 01/17/2022   Procedure: Right index finger partial amputation;  Surgeon: Gomez Cleverly, MD;  Location: [facility] OR;  Service: Orthopedics;  Laterality: Right;  with local anesthesia   LAPAROSCOPIC APPENDECTOMY N/A 06/09/2023   Procedure: APPENDECTOMY LAPAROSCOPIC;  Surgeon: Fritzi Mandes, MD;  Location: [facility] ORS;  Service: General;  Laterality: N/A;   THYROIDECTOMY     TOTAL HIP ARTHROPLASTY Left 06/30/2020   Procedure: LEFT TOTAL HIP ARTHROPLASTY ANTERIOR APPROACH;  Surgeon: Kathryne Hitch, MD;  Location: [facility] ORS;  Service: Orthopedics;  Laterality: Left;   TUBAL LIGATION     WISDOM TOOTH EXTRACTION      Allergies  Allergen Reactions   Codeine Nausea And Vomiting and Other (See Comments)    ROS denies nausea, vomiting, fever, chills, chest pain, shortness of breath   Physical Exam: There were no vitals filed for this visit.  General: The patient is alert and oriented x3 in no acute distress.  Dermatology: Skin is warm, dry and supple bilateral lower extremities.  Painful callus present bilateral subfifth met base and right second hammertoe distal medial aspect.   Toenails are thickened, dystrophic with signs of increased keratinization.  The great toenails are most severe.  Some  nail plate separation noted to the right hallux.  Vascular: Palpable pedal pulses bilaterally. Capillary refill within normal limits.  No appreciable edema.  No erythema or calor.  Neurological: Light touch sensation grossly intact bilateral feet.   Musculoskeletal Exam: Hammertoe contractures noted bilaterally, semireducible.  Reducible bunion deformities noted.   Assessment/Plan of Care: 1. Callus   2. Hammertoe of right foot   3. Bunion, right foot      No orders of the defined types were placed in this encounter.  None  Discussed clinical findings with patient today.  # Preulcerative calluses bilateral fifth met bases and right second interdigital callus -All symptomatic hyperkeratoses were safely debrided as a courtesy with a sterile #312 blade to patient's level of comfort without incident. We discussed preventative and palliative care of these lesions including supportive and accommodative shoegear, padding, prefabricated and custom molded accommodative orthoses, use of a pumice stone and lotions/creams daily.  Offloading padding with dancers pads applied and toe separators for right first interspace -Instructed patient to apply topical urea cream to the calluses daily. - Patient understands that she will need ABN for this in the future  # Nail dystrophy - Nail pathology reviewed with patient which was negative for onychomycosis - Did recommend that she can try a tree oil mixed with coconut oil which may soften the nails and improve appearance to degree  # Hammertoe contractures with bunion deformity right foot -Contributing to the  callus formation right second toe distal aspect. -Discussed padding and shoe gear modification - We did briefly discuss surgical treatment for this.  She would like to defer this for now.  Follow-up as needed, radiographs at follow-up.    Hayden Mabin L. Marchia Bond, AACFAS [facility]     [address] [place], [place] [address]                Office [phone]  Fax [phone]

## 2023-10-15 DIAGNOSIS — Z79899 Other long term (current) drug therapy: Secondary | ICD-10-CM | POA: Diagnosis not present

## 2023-10-15 DIAGNOSIS — Z789 Other specified health status: Secondary | ICD-10-CM | POA: Diagnosis not present

## 2023-10-17 ENCOUNTER — Other Ambulatory Visit: Payer: Self-pay | Admitting: Family Medicine

## 2023-11-03 ENCOUNTER — Other Ambulatory Visit: Payer: Self-pay

## 2023-11-03 MED ORDER — NITROGLYCERIN 0.2 MG/HR TD PT24: MEDICATED_PATCH | TRANSDERMAL | 0 refills | Status: DC

## 2023-11-12 DIAGNOSIS — L578 Other skin changes due to chronic exposure to nonionizing radiation: Secondary | ICD-10-CM | POA: Diagnosis not present

## 2023-11-12 DIAGNOSIS — D229 Melanocytic nevi, unspecified: Secondary | ICD-10-CM | POA: Diagnosis not present

## 2023-11-12 DIAGNOSIS — L814 Other melanin hyperpigmentation: Secondary | ICD-10-CM | POA: Diagnosis not present

## 2023-11-12 DIAGNOSIS — D1801 Hemangioma of skin and subcutaneous tissue: Secondary | ICD-10-CM | POA: Diagnosis not present

## 2023-11-12 DIAGNOSIS — L570 Actinic keratosis: Secondary | ICD-10-CM | POA: Diagnosis not present

## 2023-11-12 DIAGNOSIS — M0630 Rheumatoid nodule, unspecified site: Secondary | ICD-10-CM | POA: Diagnosis not present

## 2023-11-12 DIAGNOSIS — L821 Other seborrheic keratosis: Secondary | ICD-10-CM | POA: Diagnosis not present

## 2023-11-12 DIAGNOSIS — Z85828 Personal history of other malignant neoplasm of skin: Secondary | ICD-10-CM | POA: Diagnosis not present

## 2023-11-25 DIAGNOSIS — L03019 Cellulitis of unspecified finger: Secondary | ICD-10-CM | POA: Diagnosis not present

## 2023-11-25 DIAGNOSIS — R0609 Other forms of dyspnea: Secondary | ICD-10-CM | POA: Diagnosis not present

## 2023-11-25 DIAGNOSIS — R9389 Abnormal findings on diagnostic imaging of other specified body structures: Secondary | ICD-10-CM | POA: Diagnosis not present

## 2023-12-03 ENCOUNTER — Ambulatory Visit: Admitting: Family Medicine

## 2023-12-03 VITALS — BP 122/74 | Ht 65.0 in | Wt 122.0 lb

## 2023-12-03 DIAGNOSIS — M25551 Pain in right hip: Secondary | ICD-10-CM

## 2023-12-03 DIAGNOSIS — M25552 Pain in left hip: Secondary | ICD-10-CM

## 2023-12-03 MED ORDER — NITROGLYCERIN 0.2 MG/HR TD PT24: MEDICATED_PATCH | TRANSDERMAL | 2 refills | Status: AC

## 2023-12-03 NOTE — Patient Instructions (Signed)
 Add rehab exercises for your left hip flexor. Try the nitroglycerin  patches on this side 1/4th patch and change daily. See if you can do without it now on the right side - continue those exercises 2-3 days a week. Follow up in 6 weeks.

## 2023-12-03 NOTE — Progress Notes (Signed)
 PCP: Dorena Gander, MD  Subjective:   HPI: Patient is a 73 y.o. female here for bilateral hip pain.  Patient reports she's doing well. Pain in right hip that was waking her up is much better. Nitroglycerin  patches being applied daily - 1/2 patch and tolerating without side effects. Still doing home exercises for this. Also noting anterior left hip pain, feels with stretch - no injury on this side.  Past Medical History:  Diagnosis Date   Arthritis    Depression    GERD (gastroesophageal reflux disease)    Heart murmur    never has caused problems   HSV infection    valtrex    Hypothyroidism    Raynaud's disease     Current Outpatient Medications on File Prior to Visit  Medication Sig Dispense Refill   acetaminophen  (TYLENOL ) 650 MG CR tablet Take 1,300 mg by mouth daily.     aspirin  EC 81 MG tablet 1 tablet Orally Once a day for 30 days     Calcium  Carb-Cholecalciferol  (CALCIUM  + VITAMIN D3 PO) Take 1 tablet by mouth daily.     cetirizine (ZYRTEC) 10 MG tablet Take 10 mg by mouth daily.     escitalopram  (LEXAPRO ) 10 MG tablet Take 10 mg by mouth 2 (two) times a week.     levothyroxine  (SYNTHROID ) 50 MCG tablet Take 50 mcg by mouth daily before breakfast.     MAGNESIUM  PO Take 400 mg by mouth daily.     meclizine  (ANTIVERT ) 25 MG tablet Take 25 mg by mouth 3 (three) times daily as needed for dizziness (vertigo).     meloxicam  (MOBIC ) 15 MG tablet Take 1 tablet (15 mg total) by mouth daily as needed for pain. Take with food. 30 tablet 1   rosuvastatin  (CRESTOR ) 20 MG tablet Take 1 tablet (20 mg total) by mouth daily. 90 tablet 3   valACYclovir  (VALTREX ) 500 MG tablet Take 500 mg by mouth daily.     No current facility-administered medications on file prior to visit.    Past Surgical History:  Procedure Laterality Date   AMPUTATION Right 01/17/2022   Procedure: Right index finger partial amputation;  Surgeon: Ltanya Rummer, MD;  Location: [facility] OR;  Service: Orthopedics;   Laterality: Right;  with local anesthesia   LAPAROSCOPIC APPENDECTOMY N/A 06/09/2023   Procedure: APPENDECTOMY LAPAROSCOPIC;  Surgeon: Lujean Sake, MD;  Location: [facility] ORS;  Service: General;  Laterality: N/A;   THYROIDECTOMY     TOTAL HIP ARTHROPLASTY Left 06/30/2020   Procedure: LEFT TOTAL HIP ARTHROPLASTY ANTERIOR APPROACH;  Surgeon: Arnie Lao, MD;  Location: [facility] ORS;  Service: Orthopedics;  Laterality: Left;   TUBAL LIGATION     WISDOM TOOTH EXTRACTION      Allergies  Allergen Reactions   Codeine Nausea And Vomiting and Other (See Comments)    BP 122/74   Ht 5' 5 (1.651 m)   Wt 122 lb (55.3 kg)   BMI 20.30 kg/m      06/28/2021    8:51 AM 09/18/2021    3:01 PM  [facility] Exercise  Frequency of aerobic exercise (# of days/week) 7 6  Average time in minutes 40 75  Frequency of strengthening activities (# of days/week) 6 6        No data to display              Objective:  Physical Exam:  Gen: NAD, comfortable in exam room  Bilateral hips: No deformity. Full range of  motion with 5/5 strength including hip abduction.  Pain with resisted left hip flexion. No tenderness to palpation bilaterally. Neurovascularly intact distally. Negative logroll Negative faber, fadir, and piriformis stretches.    Assessment & Plan:  1. Bilateral hip pain - right side much improved from her greater trochanteric pain syndrome, gluteal tendinopathy.  Continue home exercises but try to discontinue nitroglycerin  patches for this side.  Hip flexor strain/tendinopathy on left - home exercises provided today.  Follow up in 6 weeks.

## 2023-12-04 DIAGNOSIS — I7300 Raynaud's syndrome without gangrene: Secondary | ICD-10-CM | POA: Diagnosis not present

## 2023-12-04 DIAGNOSIS — R2232 Localized swelling, mass and lump, left upper limb: Secondary | ICD-10-CM | POA: Diagnosis not present

## 2023-12-04 DIAGNOSIS — M79645 Pain in left finger(s): Secondary | ICD-10-CM | POA: Diagnosis not present

## 2023-12-04 DIAGNOSIS — M79642 Pain in left hand: Secondary | ICD-10-CM | POA: Diagnosis not present

## 2023-12-06 DIAGNOSIS — M79645 Pain in left finger(s): Secondary | ICD-10-CM | POA: Diagnosis not present

## 2023-12-10 DIAGNOSIS — M79645 Pain in left finger(s): Secondary | ICD-10-CM | POA: Diagnosis not present

## 2023-12-15 DIAGNOSIS — E7800 Pure hypercholesterolemia, unspecified: Secondary | ICD-10-CM | POA: Diagnosis not present

## 2024-01-21 ENCOUNTER — Ambulatory Visit: Admitting: Family Medicine

## 2024-01-21 VITALS — BP 122/78 | Ht 65.0 in | Wt 121.0 lb

## 2024-01-21 DIAGNOSIS — M25551 Pain in right hip: Secondary | ICD-10-CM | POA: Diagnosis not present

## 2024-01-21 DIAGNOSIS — M25552 Pain in left hip: Secondary | ICD-10-CM | POA: Diagnosis not present

## 2024-01-21 NOTE — Progress Notes (Signed)
 PCP: Rolinda Millman, MD  Subjective:   HPI: Patient is a 73 y.o. female here for follow-up of hip pain.  Patient was last seen in our office on 12/03/2023.  She was treating her right side with nitroglycerin  patches and they were discontinued at this visit.  She continues to have no pain with her right hip.  Started using nitroglycerin  on the left side after this visit, and states that she has noticed an improvement on her left hip as well, although does not feel completely better yet.  Doing home exercises.  Past Medical History:  Diagnosis Date   Arthritis    Depression    GERD (gastroesophageal reflux disease)    Heart murmur    never has caused problems   HSV infection    valtrex    Hypothyroidism    Raynaud's disease     Current Outpatient Medications on File Prior to Visit  Medication Sig Dispense Refill   acetaminophen  (TYLENOL ) 650 MG CR tablet Take 1,300 mg by mouth daily.     aspirin  EC 81 MG tablet 1 tablet Orally Once a day for 30 days     Calcium  Carb-Cholecalciferol  (CALCIUM  + VITAMIN D3 PO) Take 1 tablet by mouth daily.     cetirizine (ZYRTEC) 10 MG tablet Take 10 mg by mouth daily.     escitalopram  (LEXAPRO ) 10 MG tablet Take 10 mg by mouth 2 (two) times a week.     levothyroxine  (SYNTHROID ) 50 MCG tablet Take 50 mcg by mouth daily before breakfast.     MAGNESIUM  PO Take 400 mg by mouth daily.     meclizine  (ANTIVERT ) 25 MG tablet Take 25 mg by mouth 3 (three) times daily as needed for dizziness (vertigo).     meloxicam  (MOBIC ) 15 MG tablet Take 1 tablet (15 mg total) by mouth daily as needed for pain. Take with food. 30 tablet 1   nitroGLYCERIN  (NITRODUR - DOSED IN MG/24 HR) 0.2 mg/hr patch Apply 1/2th patch to affected hip, change daily 30 patch 2   rosuvastatin  (CRESTOR ) 20 MG tablet Take 1 tablet (20 mg total) by mouth daily. 90 tablet 3   valACYclovir  (VALTREX ) 500 MG tablet Take 500 mg by mouth daily.     No current facility-administered medications on file  prior to visit.    Past Surgical History:  Procedure Laterality Date   AMPUTATION Right 01/17/2022   Procedure: Right index finger partial amputation;  Surgeon: Alyse Agent, MD;  Location: [facility] OR;  Service: Orthopedics;  Laterality: Right;  with local anesthesia   LAPAROSCOPIC APPENDECTOMY N/A 06/09/2023   Procedure: APPENDECTOMY LAPAROSCOPIC;  Surgeon: Dasie Leonor CROME, MD;  Location: [facility] ORS;  Service: General;  Laterality: N/A;   THYROIDECTOMY     TOTAL HIP ARTHROPLASTY Left 06/30/2020   Procedure: LEFT TOTAL HIP ARTHROPLASTY ANTERIOR APPROACH;  Surgeon: Vernetta Lonni GRADE, MD;  Location: [facility] ORS;  Service: Orthopedics;  Laterality: Left;   TUBAL LIGATION     WISDOM TOOTH EXTRACTION      Allergies  Allergen Reactions   Codeine Nausea And Vomiting and Other (See Comments)    BP 122/78   Ht 5' 5 (1.651 m)   Wt 121 lb (54.9 kg)   BMI 20.14 kg/m      06/28/2021    8:51 AM 09/18/2021    3:01 PM  [facility]  Frequency of aerobic exercise (# of days/week) 7 6  Average time in minutes 40 75  Frequency of strengthening activities (# of  days/week) 6 6        No data to display              Objective:  Physical Exam:  Gen: NAD, comfortable in exam room Hip: Normal gait, no obvious deformities palpated.  Mild tenderness to palpation greater trochanters, gluteal tendons.  No pain or limitation with internal or external rotation, flexion, abduction or adduction.  5 out of 5 strength with flexion, adduction and abduction, extension.  Neurovascularly intact.  Negative logroll, fadir, faber bilaterally.   Assessment & Plan:  1.  Bilateral hip pain Right side pain has resolved.  Left side pain has seen significant reduction in pain and discomfort however still not back to her baseline.  Encouraged her to continue home exercises as well as nitroglycerin .  Follow-up as needed

## 2024-02-06 DIAGNOSIS — I7300 Raynaud's syndrome without gangrene: Secondary | ICD-10-CM | POA: Diagnosis not present

## 2024-02-06 DIAGNOSIS — Z6821 Body mass index (BMI) 21.0-21.9, adult: Secondary | ICD-10-CM | POA: Diagnosis not present

## 2024-02-06 DIAGNOSIS — R0989 Other specified symptoms and signs involving the circulatory and respiratory systems: Secondary | ICD-10-CM | POA: Diagnosis not present

## 2024-02-06 DIAGNOSIS — L942 Calcinosis cutis: Secondary | ICD-10-CM | POA: Diagnosis not present

## 2024-02-06 DIAGNOSIS — D8989 Other specified disorders involving the immune mechanism, not elsewhere classified: Secondary | ICD-10-CM | POA: Diagnosis not present

## 2024-02-06 DIAGNOSIS — M2560 Stiffness of unspecified joint, not elsewhere classified: Secondary | ICD-10-CM | POA: Diagnosis not present

## 2024-02-06 DIAGNOSIS — I7301 Raynaud's syndrome with gangrene: Secondary | ICD-10-CM | POA: Diagnosis not present

## 2024-02-19 DIAGNOSIS — J849 Interstitial pulmonary disease, unspecified: Secondary | ICD-10-CM | POA: Diagnosis not present

## 2024-02-20 DIAGNOSIS — R0989 Other specified symptoms and signs involving the circulatory and respiratory systems: Secondary | ICD-10-CM | POA: Diagnosis not present

## 2024-02-20 DIAGNOSIS — I7300 Raynaud's syndrome without gangrene: Secondary | ICD-10-CM | POA: Diagnosis not present

## 2024-02-20 DIAGNOSIS — L942 Calcinosis cutis: Secondary | ICD-10-CM | POA: Diagnosis not present

## 2024-02-20 DIAGNOSIS — Z6821 Body mass index (BMI) 21.0-21.9, adult: Secondary | ICD-10-CM | POA: Diagnosis not present

## 2024-02-20 DIAGNOSIS — M2560 Stiffness of unspecified joint, not elsewhere classified: Secondary | ICD-10-CM | POA: Diagnosis not present

## 2024-02-20 DIAGNOSIS — D8989 Other specified disorders involving the immune mechanism, not elsewhere classified: Secondary | ICD-10-CM | POA: Diagnosis not present

## 2024-02-20 DIAGNOSIS — R7401 Elevation of levels of liver transaminase levels: Secondary | ICD-10-CM | POA: Diagnosis not present

## 2024-02-24 ENCOUNTER — Ambulatory Visit (HOSPITAL_BASED_OUTPATIENT_CLINIC_OR_DEPARTMENT_OTHER)
Admission: RE | Admit: 2024-02-24 | Discharge: 2024-02-24 | Disposition: A | Discharge status: Home / Self Care | Source: Ambulatory Visit | Attending: Obstetrics | Admitting: Obstetrics

## 2024-02-24 ENCOUNTER — Other Ambulatory Visit: Payer: Medicare HMO

## 2024-02-24 DIAGNOSIS — M810 Age-related osteoporosis without current pathological fracture: Secondary | ICD-10-CM | POA: Insufficient documentation

## 2024-02-24 DIAGNOSIS — Z13820 Encounter for screening for osteoporosis: Secondary | ICD-10-CM | POA: Insufficient documentation

## 2024-02-24 DIAGNOSIS — Z780 Asymptomatic menopausal state: Secondary | ICD-10-CM | POA: Insufficient documentation

## 2024-02-24 DIAGNOSIS — M8580 Other specified disorders of bone density and structure, unspecified site: Secondary | ICD-10-CM | POA: Insufficient documentation

## 2024-03-05 ENCOUNTER — Encounter: Payer: Self-pay | Admitting: Internal Medicine

## 2024-03-10 ENCOUNTER — Ambulatory Visit (HOSPITAL_COMMUNITY)
Admission: RE | Admit: 2024-03-10 | Discharge: 2024-03-10 | Disposition: A | Discharge status: Home / Self Care | Payer: Medicare HMO | Source: Ambulatory Visit | Attending: Cardiovascular Disease | Admitting: Cardiovascular Disease

## 2024-03-10 DIAGNOSIS — I2510 Atherosclerotic heart disease of native coronary artery without angina pectoris: Secondary | ICD-10-CM | POA: Diagnosis not present

## 2024-03-10 DIAGNOSIS — E782 Mixed hyperlipidemia: Secondary | ICD-10-CM | POA: Insufficient documentation

## 2024-03-10 DIAGNOSIS — I350 Nonrheumatic aortic (valve) stenosis: Secondary | ICD-10-CM | POA: Insufficient documentation

## 2024-03-10 DIAGNOSIS — I700 Atherosclerosis of aorta: Secondary | ICD-10-CM | POA: Diagnosis not present

## 2024-03-10 LAB — ECHOCARDIOGRAM COMPLETE
AR max vel: 0.95 cm2
AV Area VTI: 0.92 cm2
AV Area mean vel: 0.86 cm2
AV Mean grad: 21 mmHg
AV Peak grad: 36.2 mmHg
Ao pk vel: 3.01 m/s
Area-P 1/2: 2.63 cm2
S' Lateral: 2.6 cm

## 2024-03-11 ENCOUNTER — Telehealth: Payer: Self-pay | Admitting: Internal Medicine

## 2024-03-11 NOTE — Telephone Encounter (Signed)
 Called Tonya Ingram back with the results of her Echo She has a history of aortic stenosis and was found to have moderate aortic stenosis prior to our evaluation. . An echocardiogram was recently performed.  She experiences mild shortness of breath and is uncertain if it is related to her lung disease or aortic stenosis. Despite this, she remains very active and is able to go upstairs without any limitations in her therapy or care.   Echocardiogram: Calcified aortic valve.  Mean gradient 21 mm Hg.  Peak gradient 3.01. Suboptimal pulse wave Doppler of LVOT signal (too inferior) but with this caveat Decreased Doppler Velocity Index (DVI) to 0.26, normal stroke volume index, AVA 0.91 cm2, Peak velocity 3.01 m/s Trasanvalvular flow rate is < 200 (1.82) CT 06/2023: valve is notably calcified.  The patient, with aortic stenosis and Raynaud's disease, presents for evaluation of recent echocardiogram findings.  She has a history of aortic stenosis and was recently found to have moderate aortic stenosis. An echocardiogram was recently performed.  She experiences mild shortness of breath and is uncertain if it is related to her lung disease or aortic stenosis. Despite this, she remains very active and is able to go upstairs without any limitations in her therapy or care.  Moderate to severe aortic stenosis with NYHA class II symptoms Moderate aortic stenosis confirmed by echocardiogram with decreased DVI to 0.26 and normal stroke volume index. Symptoms include mild dyspnea, classified as NYHA class II. Differential includes potential severe aortic stenosis, which would warrant intervention. - Order cardiac CT with TAVR protocol to assess severity of aortic stenosis. - Evaluate for potential intervention if severe aortic stenosis is confirmed. - Consider clinical trial participation if moderate aortic stenosis is confirmed.  Stanly Leavens, MD [facility] Cardiologist [facility]   [address] [place], [place] [address] [phone]  12:53 PM

## 2024-03-16 NOTE — Progress Notes (Unsigned)
 Patient ID: Tonya Ingram MRN: [id number] DOB/AGE: Nov 04, 1950 73 y.o.  Primary Care Physician:Hagler, Vernell, MD Primary Cardiologist: [place]  CC:  Aortic valvular disease management     FOCUSED PROBLEM LIST:   Aortic stenosis AVA 1.2, MG 17, V-max 2.7, EF 65 to 70% TTE August 2024 AVA 0.92, MG 21, V-max 3.0, EF 65 to 70% TTE September 2025 EKG 2024 sinus rhythm without bundle-branch blocks Coronary artery calcification Chest CT 2025 Aortic atherosclerosis Chest CT 2025 Hyperlipidemia Hypothyroidism Interstitial lung disease Chest CT January 2025 Possible scleroderma Raynauds BMI 20/BSA 1.28 February 2024:  Patient consents to use of AI scribe. The patient is a 73 year old female with the above listed medical problems referred for recommendations regarding her aortic valvular disease.  She has been followed with serial echocardiograms to monitor her aortic stenosis.  In 2024 she had a mean gradient across the aortic valve of 17 mmHg with an aortic valve area of 1.2 cm.  Most recent echocardiogram done a few weeks ago demonstrated progression to moderate aortic stenosis with a mean gradient of 21, aortic valve area 0.92, and a V-max of 3.0 with a preserved ejection fraction.  She reported increasing dyspnea on exertion and for this reason she is referred for further evaluation.  Of note she did have a CT scan in January which demonstrated interstitial lung disease.  She is scheduled to see pulmonology in the near future.  No PFTs have been performed.  She experiences shortness of breath and fatigue, particularly during physical activities such as biking and climbing stairs. Despite being a high-energy person, she notes some days she feels fatigued and prefers to rest. No wheezing, lightheadedness, or syncope occur during these episodes. No peripheral edema or orthopnea. She remains active, participating in spin classes and weight training without significant  limitations.  She has a possible diagnosis of scleroderma or CREST syndrome, experiencing Raynaud's phenomenon and calcinosis on one of her fingers. She is under the care of a rheumatologist and has undergone extensive autoimmune testing, with some results being inconclusive. A follow-up with her rheumatologist is scheduled for October.  She lives independently, drives, cooks, and shops for herself. She reports occasional lightheadedness, particularly in the mornings, but no significant vertigo episodes recently. No bleeding issues such as epistaxis or melena.       Past Medical History:  Diagnosis Date   Arthritis    Depression    GERD (gastroesophageal reflux disease)    Heart murmur    never has caused problems   HSV infection    valtrex    Hypothyroidism    Raynaud's disease     Past Surgical History:  Procedure Laterality Date   AMPUTATION Right 01/17/2022   Procedure: Right index finger partial amputation;  Surgeon: Alyse Agent, MD;  Location: [facility] OR;  Service: Orthopedics;  Laterality: Right;  with local anesthesia   LAPAROSCOPIC APPENDECTOMY N/A 06/09/2023   Procedure: APPENDECTOMY LAPAROSCOPIC;  Surgeon: Dasie Leonor CROME, MD;  Location: [facility] ORS;  Service: General;  Laterality: N/A;   THYROIDECTOMY     TOTAL HIP ARTHROPLASTY Left 06/30/2020   Procedure: LEFT TOTAL HIP ARTHROPLASTY ANTERIOR APPROACH;  Surgeon: Vernetta Lonni GRADE, MD;  Location: [facility] ORS;  Service: Orthopedics;  Laterality: Left;   TUBAL LIGATION     WISDOM TOOTH EXTRACTION      Family History  Problem Relation Age of Onset   Parkinson's disease Mother    Arthritis Mother    Prostate cancer Father    Hypertension Father  Colon cancer Neg Hx    Rectal cancer Neg Hx    Stomach cancer Neg Hx    Esophageal cancer Neg Hx     Social History   Socioeconomic History   Marital status: Divorced    Spouse name: Not on file   Number of children: Not on file   Years of education: Not on file   Highest  education level: Not on file  Occupational History   Not on file  Tobacco Use   Smoking status: Never   Smokeless tobacco: Never  Vaping Use   Vaping status: Never Used  Substance and Sexual Activity   Alcohol use: Never   Drug use: Never   Sexual activity: Yes    Birth control/protection: Surgical    Comment: tubal ligation  Other Topics Concern   Not on file  Social History Narrative   Not on file   Social Drivers of Health   Financial Resource Strain: Not on file  Food Insecurity: Not on file  Transportation Needs: Not on file  Physical Activity: Not on file  Stress: Not on file  Social Connections: Not on file  Intimate Partner Violence: Not on file     Prior to Admission medications   Medication Sig Start Date End Date Taking? Authorizing Provider  acetaminophen  (TYLENOL ) 650 MG CR tablet Take 1,300 mg by mouth daily.    [provider]  aspirin  EC 81 MG tablet 1 tablet Orally Once a day for 30 days 10/10/22   [provider]  Calcium  Carb-Cholecalciferol  (CALCIUM  + VITAMIN D3 PO) Take 1 tablet by mouth daily.    [provider]  cetirizine (ZYRTEC) 10 MG tablet Take 10 mg by mouth daily.    [provider]  escitalopram  (LEXAPRO ) 10 MG tablet Take 10 mg by mouth 2 (two) times a week.    [provider]  levothyroxine  (SYNTHROID ) 50 MCG tablet Take 50 mcg by mouth daily before breakfast.    [provider]  MAGNESIUM  PO Take 400 mg by mouth daily.    [provider]  meclizine  (ANTIVERT ) 25 MG tablet Take 25 mg by mouth 3 (three) times daily as needed for dizziness (vertigo).    [provider]  meloxicam  (MOBIC ) 15 MG tablet Take 1 tablet (15 mg total) by mouth daily as needed for pain. Take with food. 08/28/22   Arvell Evalene SAUNDERS, DO  nitroGLYCERIN  (NITRODUR - DOSED IN MG/24 HR) 0.2 mg/hr patch Apply 1/2th patch to affected hip, change daily 12/03/23   Hudnall, Ludie SAUNDERS, MD  rosuvastatin  (CRESTOR ) 20  MG tablet Take 1 tablet (20 mg total) by mouth daily. 07/14/23   Chandrasekhar, Stanly LABOR, MD  valACYclovir  (VALTREX ) 500 MG tablet Take 500 mg by mouth daily. 12/01/21   [provider]    Allergies  Allergen Reactions   Codeine Nausea And Vomiting and Other (See Comments)    REVIEW OF SYSTEMS:  General: no fevers/chills/night sweats Eyes: no blurry vision, diplopia, or amaurosis ENT: no sore throat or hearing loss Resp: no cough, wheezing, or hemoptysis CV: no edema or palpitations GI: no abdominal pain, nausea, vomiting, diarrhea, or constipation GU: no dysuria, frequency, or hematuria Skin: no rash Neuro: no headache, numbness, tingling, or weakness of extremities Musculoskeletal: no joint pain or swelling Heme: no bleeding, DVT, or easy bruising Endo: no polydipsia or polyuria  BP 128/82   Pulse (!) 55   Ht 5' 5 (1.651 m)   Wt 125 lb 12.8 oz (57.1  kg)   SpO2 97%   BMI 20.93 kg/m   PHYSICAL EXAM: GEN:  AO x 3 in no acute distress HEENT: normal Dentition: Normal Neck: JVP normal. +2carotid upstrokes without bruits. No thyromegaly. Lungs: equal expansion, clear bilaterally CV: Apex is discrete and nondisplaced, RRR with 3/6 SEM Abd: soft, non-tender, non-distended; no bruit; positive bowel sounds Ext: no edema, ecchymoses, or cyanosis Vascular: 2+ femoral pulses, 2+ radial pulses       Skin: warm and dry without rash Neuro: CN II-XII grossly intact; motor and sensory grossly intact    DATA AND STUDIES:  EKG: 2024 sinus rhythm without bundle-branch blocks  EKG Interpretation Date/Time:  Friday March 19 2024 09:16:35 EDT Ventricular Rate:  55 PR Interval:  148 QRS Duration:  86 QT Interval:  448 QTC Calculation: 428 R Axis:   -8  Text Interpretation: Sinus bradycardia When compared with ECG of 17-Mar-2023 08:31, No significant change was found Confirmed by Wendel Haws ([id number]) on 03/19/2024 9:29:56 AM        CARDIAC STUDIES: Refer to CV  Procedures and Imaging Tabs  06/08/2023: BUN 28; Creatinine, Ser 0.61; Hemoglobin 11.6; Platelets 259; Potassium 4.3; Sodium 135 07/08/2023: ALT 12   STS RISK CALCULATOR: Pending  NHYA CLASS: 1/2  CCS CLASS:1    ASSESSMENT AND PLAN:   1. Nonrheumatic aortic valve stenosis   2. Coronary artery calcification   3. Hyperlipidemia LDL goal <70   4. Aortic atherosclerosis   5. ILD (interstitial lung disease) (HCC)     Aortic stenosis: The patient is developed moderate aortic stenosis.  She has mild shortness of breath.  She denies any exertional angina or significant presyncope or syncope.  I had a long talk with the patient about potential management options for her moderate aortic stenosis.  She feels like her symptoms are not too bothersome.  She would like to monitor for now.  I did speak to her about the K[employer] trial and she is interested in perhaps being a participant.  I will see her back in 6 months with another echocardiogram.  I told her to reach out to us  if she becomes more dyspneic or develops exertional angina, presyncope, or syncope Coronary artery calcification: Continue aspirin  81 mg, rosuvastatin  20 mg Hyperlipidemia: Continue rosuvastatin  20 mg Aortic Sclerosis: Continue aspirin  81 mg, rosuvastatin  20 mg Interstitial lung disease.  The patient's shortness of breath could be multifactorial with contributions from interstitial lung disease and aortic valve stenosis.  See discussion above.  Patient is being worked up for rheumatologic lung disease.   I have personally reviewed the patients imaging data as summarized above.  I have reviewed the natural history of aortic stenosis with the patient and family members who are present today. We have discussed the limitations of medical therapy and the poor prognosis associated with symptomatic aortic stenosis. We have also reviewed potential treatment options, including palliative medical therapy, conventional surgical aortic  valve replacement, and transcatheter aortic valve replacement. We discussed treatment options in the context of this patient's specific comorbid medical conditions.   All of the patient's questions were answered today. Will make further recommendations based on the results of studies outlined above.   I spent 48 minutes reviewing all clinical data during and prior to this visit including all relevant imaging studies, laboratories, clinical information from other health systems and prior notes from both Cardiology and other specialties, interviewing the patient, conducting a complete physical examination, and coordinating care in order to formulate a comprehensive and personalized  evaluation and treatment plan.   Jamilla Galli K Paislea Hatton, MD  03/19/2024 9:59 AM    [facility] [address] [place], [place], [place]  [address] Phone: [phone]; Fax: [phone]

## 2024-03-17 DIAGNOSIS — M810 Age-related osteoporosis without current pathological fracture: Secondary | ICD-10-CM | POA: Diagnosis not present

## 2024-03-17 DIAGNOSIS — M79645 Pain in left finger(s): Secondary | ICD-10-CM | POA: Diagnosis not present

## 2024-03-18 DIAGNOSIS — D8989 Other specified disorders involving the immune mechanism, not elsewhere classified: Secondary | ICD-10-CM | POA: Diagnosis not present

## 2024-03-18 DIAGNOSIS — R7401 Elevation of levels of liver transaminase levels: Secondary | ICD-10-CM | POA: Diagnosis not present

## 2024-03-19 ENCOUNTER — Encounter: Payer: Self-pay | Admitting: Internal Medicine

## 2024-03-19 ENCOUNTER — Ambulatory Visit: Attending: Internal Medicine | Admitting: Internal Medicine

## 2024-03-19 VITALS — BP 128/82 | HR 55 | Ht 65.0 in | Wt 125.8 lb

## 2024-03-19 DIAGNOSIS — I700 Atherosclerosis of aorta: Secondary | ICD-10-CM | POA: Diagnosis not present

## 2024-03-19 DIAGNOSIS — J849 Interstitial pulmonary disease, unspecified: Secondary | ICD-10-CM

## 2024-03-19 DIAGNOSIS — I2510 Atherosclerotic heart disease of native coronary artery without angina pectoris: Secondary | ICD-10-CM | POA: Diagnosis not present

## 2024-03-19 DIAGNOSIS — E785 Hyperlipidemia, unspecified: Secondary | ICD-10-CM

## 2024-03-19 DIAGNOSIS — I350 Nonrheumatic aortic (valve) stenosis: Secondary | ICD-10-CM

## 2024-03-19 NOTE — Patient Instructions (Signed)
 Medication Instructions:  Your physician recommends that you continue on your current medications as directed. Please refer to the Current Medication list given to you today.  *If you need a refill on your cardiac medications before your next appointment, please call your pharmacy*  Testing/Procedures: Your physician has requested that you have an echocardiogram in 6 months (prior to office visit). Echocardiography is a painless test that uses sound waves to create images of your heart. It provides your doctor with information about the size and shape of your heart and how well your heart's chambers and valves are working. This procedure takes approximately one hour. There are no restrictions for this procedure. Please do NOT wear cologne, perfume, aftershave, or lotions (deodorant is allowed). Please arrive 15 minutes prior to your appointment time.  Please note: We ask at that you not bring children with you during ultrasound (echo/ vascular) testing. Due to room size and safety concerns, children are not allowed in the ultrasound rooms during exams. Our front office staff cannot provide observation of children in our lobby area while testing is being conducted. An adult accompanying a patient to their appointment will only be allowed in the ultrasound room at the discretion of the ultrasound technician under special circumstances. We apologize for any inconvenience.  Follow-Up: At [facility], you and your health needs are our priority.  As part of our continuing mission to provide you with exceptional heart care, our providers are all part of one team.  This team includes your primary Cardiologist (physician) and Advanced Practice Providers or APPs (Physician Assistants and Nurse Practitioners) who all work together to provide you with the care you need, when you need it.  Your next appointment:   6 month(s)  Provider:   Lurena Red, MD or Izetta Hummer, PA-C  We recommend  signing up for the patient portal called MyChart.  Sign up information is provided on this After Visit Summary.  MyChart is used to connect with patients for Virtual Visits (Telemedicine).  Patients are able to view lab/test results, encounter notes, upcoming appointments, etc.  Non-urgent messages can be sent to your provider as well.    To learn more about what you can do with MyChart, go to [email or web address].

## 2024-04-13 DIAGNOSIS — M2560 Stiffness of unspecified joint, not elsewhere classified: Secondary | ICD-10-CM | POA: Diagnosis not present

## 2024-04-13 DIAGNOSIS — Z6820 Body mass index (BMI) 20.0-20.9, adult: Secondary | ICD-10-CM | POA: Diagnosis not present

## 2024-04-13 DIAGNOSIS — R0989 Other specified symptoms and signs involving the circulatory and respiratory systems: Secondary | ICD-10-CM | POA: Diagnosis not present

## 2024-04-13 DIAGNOSIS — I7300 Raynaud's syndrome without gangrene: Secondary | ICD-10-CM | POA: Diagnosis not present

## 2024-04-13 DIAGNOSIS — L942 Calcinosis cutis: Secondary | ICD-10-CM | POA: Diagnosis not present

## 2024-04-13 DIAGNOSIS — M349 Systemic sclerosis, unspecified: Secondary | ICD-10-CM | POA: Diagnosis not present

## 2024-04-27 DIAGNOSIS — J849 Interstitial pulmonary disease, unspecified: Secondary | ICD-10-CM | POA: Diagnosis not present

## 2024-05-19 ENCOUNTER — Other Ambulatory Visit (HOSPITAL_COMMUNITY): Payer: Self-pay | Admitting: Obstetrics

## 2024-05-19 ENCOUNTER — Telehealth (HOSPITAL_COMMUNITY): Payer: Self-pay | Admitting: Pharmacy Technician

## 2024-05-19 DIAGNOSIS — M810 Age-related osteoporosis without current pathological fracture: Secondary | ICD-10-CM | POA: Insufficient documentation

## 2024-05-19 NOTE — Telephone Encounter (Signed)
 Auth Submission: NO AUTH NEEDED Site of care: [facility] Payer: [employer] MEDICARE Medication & CPT/J Code(s) submitted: Reclast (Zolendronic acid) [id number] Diagnosis Code: M81.0 Route of submission (phone, fax, portal):  Phone # Fax # Auth type: Buy/Bill HB Units/visits requested: 5MG  X 1 DOSE, EVERY 12 MONTHS Reference number:  Approval from: 05/19/2024 to 06/23/24     Dagoberto Armour, CPhT [facility] Phone: [phone] 05/19/2024

## 2024-06-01 ENCOUNTER — Ambulatory Visit (HOSPITAL_COMMUNITY)
Admission: RE | Admit: 2024-06-01 | Discharge: 2024-06-01 | Disposition: A | Source: Ambulatory Visit | Attending: Obstetrics

## 2024-06-01 VITALS — BP 139/72 | HR 51 | Temp 97.6°F | Resp 16

## 2024-06-01 DIAGNOSIS — M810 Age-related osteoporosis without current pathological fracture: Secondary | ICD-10-CM

## 2024-06-01 MED ORDER — SODIUM CHLORIDE 0.9 % IV SOLN: INTRAVENOUS | Status: DC

## 2024-06-01 MED ORDER — ZOLEDRONIC ACID 5 MG/100ML IV SOLN
5.0000 mg | Freq: Once | INTRAVENOUS | Status: AC
  Administered 2024-06-01: 5 mg via INTRAVENOUS

## 2024-06-01 MED ORDER — ACETAMINOPHEN 325 MG PO TABS: 650.0000 mg | ORAL_TABLET | Freq: Once | ORAL | Status: DC

## 2024-06-01 MED ORDER — ZOLEDRONIC ACID 5 MG/100ML IV SOLN
INTRAVENOUS | Status: AC
  Filled 2024-06-01: qty 100

## 2024-06-01 MED ORDER — DIPHENHYDRAMINE HCL 25 MG PO CAPS: 25.0000 mg | ORAL_CAPSULE | Freq: Once | ORAL | Status: DC

## 2024-07-07 ENCOUNTER — Other Ambulatory Visit: Payer: Self-pay | Admitting: Family Medicine

## 2024-09-01 ENCOUNTER — Other Ambulatory Visit (HOSPITAL_COMMUNITY)

## 2024-09-10 ENCOUNTER — Ambulatory Visit (HOSPITAL_COMMUNITY)

## 2024-09-10 ENCOUNTER — Ambulatory Visit: Admitting: Internal Medicine
